# Patient Record
Sex: Female | Born: 1975 | Race: Black or African American | Hispanic: No | Smoking: Never smoker
Health system: Southern US, Community
[De-identification: ages and names within clinical notes are randomized; demographics above are authoritative.]

## PROBLEM LIST (undated history)

## (undated) DIAGNOSIS — J309 Allergic rhinitis, unspecified: Secondary | ICD-10-CM

## (undated) DIAGNOSIS — J42 Unspecified chronic bronchitis: Secondary | ICD-10-CM

## (undated) DIAGNOSIS — J189 Pneumonia, unspecified organism: Secondary | ICD-10-CM

## (undated) DIAGNOSIS — I509 Heart failure, unspecified: Secondary | ICD-10-CM

## (undated) HISTORY — DX: Pneumonia, unspecified organism: J18.9

## (undated) HISTORY — PX: OTHER SURGICAL HISTORY: SHX169

## (undated) HISTORY — DX: Allergic rhinitis, unspecified: J30.9

## (undated) HISTORY — DX: Unspecified chronic bronchitis: J42

---

## 1998-11-05 ENCOUNTER — Inpatient Hospital Stay (HOSPITAL_COMMUNITY): Admission: EM | Admit: 1998-11-05 | Discharge: 1998-12-02 | Payer: Self-pay | Admitting: Emergency Medicine

## 1998-11-05 ENCOUNTER — Encounter: Payer: Self-pay | Admitting: Emergency Medicine

## 1998-11-06 ENCOUNTER — Encounter: Payer: Self-pay | Admitting: Critical Care Medicine

## 1998-11-06 ENCOUNTER — Encounter: Payer: Self-pay | Admitting: Emergency Medicine

## 1998-11-07 ENCOUNTER — Encounter: Payer: Self-pay | Admitting: Critical Care Medicine

## 1998-11-08 ENCOUNTER — Encounter: Payer: Self-pay | Admitting: Critical Care Medicine

## 1998-11-09 ENCOUNTER — Encounter: Payer: Self-pay | Admitting: Critical Care Medicine

## 1998-11-10 ENCOUNTER — Encounter: Payer: Self-pay | Admitting: Critical Care Medicine

## 1998-11-14 ENCOUNTER — Encounter: Payer: Self-pay | Admitting: Pulmonary Disease

## 1998-11-17 ENCOUNTER — Encounter: Payer: Self-pay | Admitting: Pulmonary Disease

## 1998-11-22 ENCOUNTER — Encounter: Payer: Self-pay | Admitting: Pulmonary Disease

## 1998-11-23 ENCOUNTER — Encounter: Payer: Self-pay | Admitting: Pulmonary Disease

## 1998-11-24 ENCOUNTER — Encounter: Payer: Self-pay | Admitting: Pulmonary Disease

## 2001-11-16 ENCOUNTER — Emergency Department (HOSPITAL_COMMUNITY): Admission: EM | Admit: 2001-11-16 | Discharge: 2001-11-16 | Payer: Self-pay

## 2001-12-01 ENCOUNTER — Encounter: Payer: Self-pay | Admitting: Family Medicine

## 2001-12-01 ENCOUNTER — Ambulatory Visit (HOSPITAL_COMMUNITY): Admission: RE | Admit: 2001-12-01 | Discharge: 2001-12-01 | Payer: Self-pay | Admitting: Family Medicine

## 2002-11-30 ENCOUNTER — Encounter: Payer: Self-pay | Admitting: Emergency Medicine

## 2002-11-30 ENCOUNTER — Emergency Department (HOSPITAL_COMMUNITY): Admission: EM | Admit: 2002-11-30 | Discharge: 2002-11-30 | Payer: Self-pay | Admitting: Emergency Medicine

## 2004-01-04 ENCOUNTER — Emergency Department (HOSPITAL_COMMUNITY): Admission: EM | Admit: 2004-01-04 | Discharge: 2004-01-05 | Payer: Self-pay | Admitting: Emergency Medicine

## 2005-03-20 ENCOUNTER — Emergency Department (HOSPITAL_COMMUNITY): Admission: EM | Admit: 2005-03-20 | Discharge: 2005-03-20 | Payer: Self-pay | Admitting: Emergency Medicine

## 2005-09-13 ENCOUNTER — Ambulatory Visit: Payer: Self-pay | Admitting: Family Medicine

## 2005-09-17 ENCOUNTER — Ambulatory Visit: Payer: Self-pay | Admitting: *Deleted

## 2005-12-06 ENCOUNTER — Ambulatory Visit: Payer: Self-pay | Admitting: Family Medicine

## 2006-01-03 ENCOUNTER — Emergency Department (HOSPITAL_COMMUNITY): Admission: EM | Admit: 2006-01-03 | Discharge: 2006-01-03 | Payer: Self-pay | Admitting: Emergency Medicine

## 2006-09-10 ENCOUNTER — Emergency Department (HOSPITAL_COMMUNITY): Admission: EM | Admit: 2006-09-10 | Discharge: 2006-09-10 | Payer: Self-pay | Admitting: Emergency Medicine

## 2007-10-14 ENCOUNTER — Ambulatory Visit: Payer: Self-pay | Admitting: Family Medicine

## 2007-12-05 ENCOUNTER — Emergency Department (HOSPITAL_COMMUNITY): Admission: EM | Admit: 2007-12-05 | Discharge: 2007-12-05 | Payer: Self-pay | Admitting: Emergency Medicine

## 2008-10-01 ENCOUNTER — Emergency Department (HOSPITAL_COMMUNITY): Admission: EM | Admit: 2008-10-01 | Discharge: 2008-10-01 | Payer: Self-pay | Admitting: Emergency Medicine

## 2008-10-18 ENCOUNTER — Emergency Department (HOSPITAL_COMMUNITY): Admission: EM | Admit: 2008-10-18 | Discharge: 2008-10-19 | Payer: Self-pay | Admitting: Emergency Medicine

## 2008-11-02 ENCOUNTER — Emergency Department (HOSPITAL_COMMUNITY): Admission: EM | Admit: 2008-11-02 | Discharge: 2008-11-02 | Payer: Self-pay | Admitting: Family Medicine

## 2009-05-09 ENCOUNTER — Emergency Department (HOSPITAL_COMMUNITY): Admission: EM | Admit: 2009-05-09 | Discharge: 2009-05-09 | Payer: Self-pay | Admitting: Emergency Medicine

## 2009-06-24 ENCOUNTER — Emergency Department (HOSPITAL_COMMUNITY): Admission: EM | Admit: 2009-06-24 | Discharge: 2009-06-24 | Payer: Self-pay | Admitting: Emergency Medicine

## 2009-08-18 ENCOUNTER — Emergency Department (HOSPITAL_COMMUNITY): Admission: EM | Admit: 2009-08-18 | Discharge: 2009-08-18 | Payer: Self-pay | Admitting: Emergency Medicine

## 2009-11-12 ENCOUNTER — Emergency Department (HOSPITAL_COMMUNITY): Admission: EM | Admit: 2009-11-12 | Discharge: 2009-11-12 | Payer: Self-pay | Admitting: Emergency Medicine

## 2010-02-01 ENCOUNTER — Emergency Department (HOSPITAL_COMMUNITY): Admission: EM | Admit: 2010-02-01 | Discharge: 2010-02-02 | Payer: Self-pay | Admitting: Emergency Medicine

## 2011-01-17 LAB — URINALYSIS, ROUTINE W REFLEX MICROSCOPIC
Bilirubin Urine: NEGATIVE
Hgb urine dipstick: NEGATIVE
Ketones, ur: 15 mg/dL — AB
Protein, ur: NEGATIVE mg/dL
Urobilinogen, UA: 0.2 mg/dL (ref 0.0–1.0)

## 2011-01-17 LAB — DIFFERENTIAL
Basophils Absolute: 0.1 10*3/uL (ref 0.0–0.1)
Lymphocytes Relative: 34 % (ref 12–46)
Lymphs Abs: 2.8 10*3/uL (ref 0.7–4.0)
Monocytes Absolute: 0.5 10*3/uL (ref 0.1–1.0)
Monocytes Relative: 6 % (ref 3–12)
Neutro Abs: 4.8 10*3/uL (ref 1.7–7.7)
Neutrophils Relative %: 57 % (ref 43–77)

## 2011-01-17 LAB — BASIC METABOLIC PANEL
CO2: 24 mEq/L (ref 19–32)
Calcium: 9.2 mg/dL (ref 8.4–10.5)
Chloride: 107 mEq/L (ref 96–112)
GFR calc Af Amer: >60 mL/min (ref >60)
Glucose, Bld: 81 mg/dL (ref 70–99)
Potassium: 3.5 mEq/L (ref 3.5–5.1)

## 2011-01-17 LAB — CBC: Hemoglobin: 12.5 g/dL (ref 12.0–15.0)

## 2011-01-17 LAB — PREGNANCY, URINE: Preg Test, Ur: NEGATIVE

## 2011-01-17 LAB — ABO/RH: ABO/RH(D): A POS

## 2011-01-17 LAB — TYPE AND SCREEN: ABO/RH(D): A POS

## 2011-08-01 ENCOUNTER — Emergency Department (HOSPITAL_COMMUNITY)
Admission: EM | Admit: 2011-08-01 | Discharge: 2011-08-02 | Disposition: A | Discharge status: Home / Self Care | Payer: Self-pay | Attending: Emergency Medicine | Admitting: Emergency Medicine

## 2011-08-01 DIAGNOSIS — E669 Obesity, unspecified: Secondary | ICD-10-CM | POA: Insufficient documentation

## 2011-08-01 DIAGNOSIS — R112 Nausea with vomiting, unspecified: Secondary | ICD-10-CM | POA: Insufficient documentation

## 2011-08-01 DIAGNOSIS — H5710 Ocular pain, unspecified eye: Secondary | ICD-10-CM | POA: Insufficient documentation

## 2011-08-01 DIAGNOSIS — J45909 Unspecified asthma, uncomplicated: Secondary | ICD-10-CM | POA: Insufficient documentation

## 2011-08-01 DIAGNOSIS — R51 Headache: Secondary | ICD-10-CM | POA: Insufficient documentation

## 2011-08-03 LAB — CK TOTAL AND CKMB (NOT AT ARMC)
CK, MB: 1.4 ng/mL (ref 0.3–4.0)
Total CK: 161 U/L (ref 7–177)

## 2011-08-03 LAB — URINALYSIS, ROUTINE W REFLEX MICROSCOPIC
Glucose, UA: NEGATIVE mg/dL
Ketones, ur: >80 mg/dL — AB
Protein, ur: 100 mg/dL — AB
Specific Gravity, Urine: 1.034 — ABNORMAL HIGH (ref 1.005–1.030)
Urobilinogen, UA: 2 mg/dL — ABNORMAL HIGH (ref 0.0–1.0)

## 2011-08-03 LAB — POCT I-STAT, CHEM 8
Creatinine, Ser: 1 mg/dL (ref 0.4–1.2)
HCT: 37 % (ref 36.0–46.0)
Hemoglobin: 12.6 g/dL (ref 12.0–15.0)
Sodium: 135 mEq/L (ref 135–145)
TCO2: 16 mmol/L (ref 0–100)

## 2011-08-03 LAB — CBC
MCHC: 32.5 g/dL (ref 30.0–36.0)
Platelets: 307 10*3/uL (ref 150–400)
RBC: 4.72 MIL/uL (ref 3.87–5.11)

## 2011-08-03 LAB — DIFFERENTIAL
Basophils Absolute: 0.1 10*3/uL (ref 0.0–0.1)
Lymphocytes Relative: 10 % — ABNORMAL LOW (ref 12–46)
Neutro Abs: 16.7 10*3/uL — ABNORMAL HIGH (ref 1.7–7.7)

## 2011-08-03 LAB — HEPATIC FUNCTION PANEL
ALT: 11 U/L (ref 0–35)
AST: 17 U/L (ref 0–37)
Total Bilirubin: 1.1 mg/dL (ref 0.3–1.2)

## 2011-08-03 LAB — URINE MICROSCOPIC-ADD ON

## 2011-08-03 LAB — AMYLASE: Amylase: 125 U/L (ref 27–131)

## 2011-09-24 ENCOUNTER — Emergency Department (HOSPITAL_COMMUNITY)
Admission: EM | Admit: 2011-09-24 | Discharge: 2011-09-24 | Disposition: A | Discharge status: Home / Self Care | Payer: Self-pay | Attending: Emergency Medicine | Admitting: Emergency Medicine

## 2011-09-24 ENCOUNTER — Encounter: Payer: Self-pay | Admitting: Emergency Medicine

## 2011-09-24 DIAGNOSIS — K047 Periapical abscess without sinus: Secondary | ICD-10-CM | POA: Insufficient documentation

## 2011-09-24 DIAGNOSIS — R221 Localized swelling, mass and lump, neck: Secondary | ICD-10-CM | POA: Insufficient documentation

## 2011-09-24 DIAGNOSIS — K029 Dental caries, unspecified: Secondary | ICD-10-CM | POA: Insufficient documentation

## 2011-09-24 DIAGNOSIS — K0510 Chronic gingivitis, plaque induced: Secondary | ICD-10-CM | POA: Insufficient documentation

## 2011-09-24 DIAGNOSIS — J45909 Unspecified asthma, uncomplicated: Secondary | ICD-10-CM | POA: Insufficient documentation

## 2011-09-24 DIAGNOSIS — R220 Localized swelling, mass and lump, head: Secondary | ICD-10-CM | POA: Insufficient documentation

## 2011-09-24 MED ORDER — TRAMADOL HCL 50 MG PO TABS
50.0000 mg | ORAL_TABLET | Freq: Once | ORAL | Status: AC
  Administered 2011-09-24: 50 mg via ORAL
  Filled 2011-09-24: qty 1

## 2011-09-24 MED ORDER — TRAMADOL HCL 50 MG PO TABS: 50.0000 mg | ORAL_TABLET | Freq: Four times a day (QID) | ORAL | Status: AC | PRN

## 2011-09-24 MED ORDER — PENICILLIN V POTASSIUM 500 MG PO TABS: 500.0000 mg | ORAL_TABLET | Freq: Three times a day (TID) | ORAL | Status: AC

## 2011-09-24 MED ORDER — PENICILLIN V POTASSIUM 250 MG PO TABS
500.0000 mg | ORAL_TABLET | Freq: Once | ORAL | Status: AC
  Administered 2011-09-24: 500 mg via ORAL
  Filled 2011-09-24: qty 2

## 2011-09-24 NOTE — ED Notes (Signed)
C/o abscess to L side of face since Friday.

## 2011-09-24 NOTE — ED Provider Notes (Signed)
Medical screening examination/treatment/procedure(s) were performed by non-physician practitioner and as supervising physician I was immediately available for consultation/collaboration.    Nelia Shi, MD 09/24/11 1330

## 2011-09-24 NOTE — ED Provider Notes (Signed)
History     CSN: [id number] Arrival date & time: 09/24/2011 12:30 AM   First MD Initiated Contact with Patient 09/24/11 0458      Chief Complaint  Patient presents with  . Abscess    (Consider location/radiation/quality/duration/timing/severity/associated sxs/prior treatment) HPI Comments: This patient with extensive dental caries and gingivitis.  Now has a left lower gum swollen area.  Patient is fearful that this is cancer.  She states that last year.  She was to have some dental work, but she backed out due to fear, does not have a Radiation protection practitioner at this time.  She has not tried any over-the-counter medication for the pain does not apply ice to the side of her face .  Patient makes the area, more pain.Marland Kitchen this has been going on for 3 days  Patient is a 35 y.o. female presenting with abscess. The history is provided by the patient.  Abscess  The current episode started yesterday. The problem occurs continuously. The problem has been gradually worsening. The abscess is present on the face. The problem is mild. The abscess is characterized by swelling. The abscess first occurred at home. There were no sick contacts. She has received no recent medical care.    Past Medical History  Diagnosis Date  . Asthma     History reviewed. No pertinent past surgical history.  No family history on file.  History  Substance Use Topics  . Smoking status: Never Smoker   . Smokeless tobacco: Not on file  . Alcohol Use: No    OB History    Grav Para Term Preterm Abortions TAB SAB Ect Mult Living                  Review of Systems  Constitutional: Negative.   HENT: Positive for facial swelling. Negative for ear pain and neck pain.   Eyes: Negative.   Respiratory: Negative.   Cardiovascular: Negative.   Gastrointestinal: Negative.   Genitourinary: Negative.   Musculoskeletal: Negative.   Neurological: Negative for weakness and headaches.  Hematological: Negative for adenopathy.    Psychiatric/Behavioral: Negative.     Allergies  Aspirin  Home Medications   Current Outpatient Rx  Name Route Sig Dispense Refill  . ALBUTEROL SULFATE HFA 108 (90 BASE) MCG/ACT IN AERS Inhalation Inhale 2 puffs into the lungs every 4 (four) hours as needed. For asthma attacks.     . IBUPROFEN 200 MG PO TABS Oral Take 800 mg by mouth every 6 (six) hours as needed. For pain.     Marland Kitchen PENICILLIN V POTASSIUM 500 MG PO TABS Oral Take 1 tablet (500 mg total) by mouth 3 (three) times daily. 30 tablet 0  . TRAMADOL HCL 50 MG PO TABS Oral Take 1 tablet (50 mg total) by mouth every 6 (six) hours as needed for pain. Maximum dose= 8 tablets per day 15 tablet 0    BP 129/82  Pulse 103  Temp(Src) 98.1 F (36.7 C) (Oral)  Resp 18  SpO2 100%  LMP 08/25/2011  Physical Exam  Constitutional: She is oriented to person, place, and time. She appears well-developed and well-nourished.  HENT:  Head: Normocephalic.  Mouth/Throat: Dental abscesses and dental caries present. No uvula swelling.    Eyes: EOM are normal.  Neck: Normal range of motion.  Cardiovascular: Normal rate.   Pulmonary/Chest: Effort normal.  Musculoskeletal: Normal range of motion.  Neurological: She is oriented to person, place, and time.  Skin: Skin is warm.  Psychiatric: She has a  normal mood and affect.    ED Course  Procedures (including critical care time)  Labs Reviewed - No data to display No results found.   1. Dental abscess       MDM  This is most likely dental abscess to 2 extensive cavities no anterior cervical adenopathy        Arman Filter, NP 09/24/11 [id number]

## 2013-01-28 ENCOUNTER — Emergency Department (HOSPITAL_COMMUNITY)
Admission: EM | Admit: 2013-01-28 | Discharge: 2013-01-28 | Disposition: A | Discharge status: Home / Self Care | Payer: BC Managed Care – PPO | Source: Home / Self Care | Attending: Emergency Medicine | Admitting: Emergency Medicine

## 2013-01-28 ENCOUNTER — Encounter (HOSPITAL_COMMUNITY): Payer: Self-pay | Admitting: *Deleted

## 2013-01-28 ENCOUNTER — Emergency Department (INDEPENDENT_AMBULATORY_CARE_PROVIDER_SITE_OTHER): Payer: BC Managed Care – PPO

## 2013-01-28 DIAGNOSIS — M25569 Pain in unspecified knee: Secondary | ICD-10-CM

## 2013-01-28 DIAGNOSIS — M25561 Pain in right knee: Secondary | ICD-10-CM

## 2013-01-28 MED ORDER — METHYLPREDNISOLONE ACETATE 80 MG/ML IJ SUSP
INTRAMUSCULAR | Status: AC
  Filled 2013-01-28: qty 1

## 2013-01-28 MED ORDER — HYDROCODONE-ACETAMINOPHEN 5-325 MG PO TABS
ORAL_TABLET | ORAL | Status: AC
  Filled 2013-01-28: qty 1

## 2013-01-28 MED ORDER — PREDNISONE 20 MG PO TABS: 20.0000 mg | ORAL_TABLET | Freq: Two times a day (BID) | ORAL | Status: DC

## 2013-01-28 MED ORDER — KETOROLAC TROMETHAMINE 60 MG/2ML IM SOLN
60.0000 mg | Freq: Once | INTRAMUSCULAR | Status: AC
  Administered 2013-01-28: 60 mg via INTRAMUSCULAR

## 2013-01-28 MED ORDER — METHYLPREDNISOLONE ACETATE 80 MG/ML IJ SUSP
80.0000 mg | Freq: Once | INTRAMUSCULAR | Status: AC
  Administered 2013-01-28: 80 mg via INTRAMUSCULAR

## 2013-01-28 MED ORDER — HYDROCODONE-ACETAMINOPHEN 5-325 MG PO TABS: ORAL_TABLET | ORAL | Status: DC

## 2013-01-28 MED ORDER — KETOROLAC TROMETHAMINE 60 MG/2ML IM SOLN
INTRAMUSCULAR | Status: AC
  Filled 2013-01-28: qty 2

## 2013-01-28 MED ORDER — DICLOFENAC SODIUM 75 MG PO TBEC: 75.0000 mg | DELAYED_RELEASE_TABLET | Freq: Two times a day (BID) | ORAL | Status: DC

## 2013-01-28 MED ORDER — HYDROCODONE-ACETAMINOPHEN 5-325 MG PO TABS
1.0000 | ORAL_TABLET | Freq: Once | ORAL | Status: AC
  Administered 2013-01-28: 1 via ORAL

## 2013-01-28 NOTE — ED Provider Notes (Signed)
Chief Complaint:   Chief Complaint  Patient presents with  . Knee Pain    History of Present Illness:   Carolyn Oconnor is a 37 year old female who has a one-week history of gradually increasing right knee pain. The pain is rated 9/10 in intensity. It involves the entire knee with radiation up in the thigh. She has some swelling of the knee and numbness. It hurts to bend the knee, walk, or cooperative on steps. Nothing makes it better. There is no locking, popping, or giving way of the joint. She denies any injury. No prior history of arthritis or knee problems.  Review of Systems:  Other than noted above, the patient denies any of the following symptoms: Systemic:  No fevers, chills, sweats, or aches.  No fatigue or tiredness. Musculoskeletal:  No joint pain, arthritis, bursitis, swelling, back pain, or neck pain. Neurological:  No muscular weakness, paresthesias, headache, or trouble with speech or coordination.  No dizziness.  PMFSH:  Past medical history, family history, social history, meds, and allergies were reviewed.  She is allergic to aspirin which causes a flareup of her asthma, but is able to take other nonsteroidal anti-inflammatory agents. Her asthma is controlled with Ventolin.  Physical Exam:   Vital signs:  BP 91/56  Pulse 88  Temp(Src) 98 F (36.7 C) (Oral)  Resp 22  SpO2 100% Gen:  Alert and oriented times 3.  In no distress. Musculoskeletal: No swelling or effusion. The knee is extremely tender to palpation and she only moved up 2. Unable to do McMurray's sign, Lachman's sign, drawer sign, or varus or valgus stress.  Otherwise, all joints had a full a ROM with no swelling, bruising or deformity.  No edema, pulses full. Extremities were warm and pink.  Capillary refill was brisk.  Skin:  Clear, warm and dry.  No rash. Neuro:  Alert and oriented times 3.  Muscle strength was normal.  Sensation was intact to light touch.   Radiology:  Dg Knee Complete 4 Views  Right  01/28/2013  *RADIOLOGY REPORT*  Clinical Data: Pain, no known injury  RIGHT KNEE - COMPLETE 4+ VIEW  Comparison: None.  Findings: Four views of the right knee submitted.  No acute fracture or subluxation.  No radiopaque foreign body.  Small joint effusion.  IMPRESSION: No acute fracture or subluxation.  Small joint effusion.   Original Report Authenticated By: Natasha Mead, M.D.    I reviewed the images independently and personally and concur with the radiologist's findings.  Course in Urgent Care Center:    She was placed in a knee immobilizer and given crutches for ambulation. For pain she was given Toradol 60 mg IM, Depo-Medrol 80 mg IM, and Norco 5/325 one by mouth.  Assessment:  The encounter diagnosis was Knee pain, acute, right.  Her x-ray showed only a small effusion and no bony abnormalities. Suspect cartilage injury or damage, tendinitis, or tendon or ligament injury. She will need followup by orthopedist.  Plan:   1.  The following meds were prescribed:   Discharge Medication List as of 01/28/2013  2:22 PM    START taking these medications   Details  diclofenac (VOLTAREN) 75 MG EC tablet Take 1 tablet (75 mg total) by mouth 2 (two) times daily., Starting 01/28/2013, Until Discontinued, Normal    HYDROcodone-acetaminophen (NORCO/VICODIN) 5-325 MG per tablet 1 to 2 tabs every 4 to 6 hours as needed for pain., Print    predniSONE (DELTASONE) 20 MG tablet Take 1 tablet (20  mg total) by mouth 2 (two) times daily., Starting 01/28/2013, Until Discontinued, Normal       2.  The patient was instructed in symptomatic care, including rest and activity, elevation, application of ice and compression.  Appropriate handouts were given. 3.  The patient was told to return if becoming worse in any way, if no better in 3 or 4 days, and given some red flag symptoms such as fever, worsening pain, or neurological symptoms that would indicate earlier return.   4.  The patient was told to follow up Dr.  Patsy Lager as soon as possible.    Reuben Likes, MD 01/28/13 2026

## 2013-01-28 NOTE — ED Notes (Signed)
Pt  Reports  Pain  r  Knee  For  About  1  Week  She  denys  Any  Injury  She  States  The  Knee  Seems  Slightly swollen  She  Reports  Pain on  Ambulation

## 2014-01-22 ENCOUNTER — Emergency Department (INDEPENDENT_AMBULATORY_CARE_PROVIDER_SITE_OTHER)
Admission: EM | Admit: 2014-01-22 | Discharge: 2014-01-22 | Disposition: A | Discharge status: Home / Self Care | Payer: BC Managed Care – PPO | Source: Home / Self Care | Attending: Family Medicine | Admitting: Family Medicine

## 2014-01-22 ENCOUNTER — Encounter (HOSPITAL_COMMUNITY): Payer: Self-pay | Admitting: Emergency Medicine

## 2014-01-22 ENCOUNTER — Emergency Department (INDEPENDENT_AMBULATORY_CARE_PROVIDER_SITE_OTHER): Payer: BC Managed Care – PPO

## 2014-01-22 DIAGNOSIS — R05 Cough: Secondary | ICD-10-CM

## 2014-01-22 DIAGNOSIS — M928 Other specified juvenile osteochondrosis: Secondary | ICD-10-CM

## 2014-01-22 DIAGNOSIS — A088 Other specified intestinal infections: Secondary | ICD-10-CM

## 2014-01-22 DIAGNOSIS — R059 Cough, unspecified: Secondary | ICD-10-CM

## 2014-01-22 DIAGNOSIS — A084 Viral intestinal infection, unspecified: Secondary | ICD-10-CM

## 2014-01-22 DIAGNOSIS — J45991 Cough variant asthma: Secondary | ICD-10-CM

## 2014-01-22 DIAGNOSIS — M9260 Juvenile osteochondrosis of tarsus, unspecified ankle: Secondary | ICD-10-CM

## 2014-01-22 DIAGNOSIS — R053 Chronic cough: Secondary | ICD-10-CM

## 2014-01-22 MED ORDER — ONDANSETRON HCL 8 MG PO TABS: 8.0000 mg | ORAL_TABLET | Freq: Three times a day (TID) | ORAL | Status: DC | PRN

## 2014-01-22 MED ORDER — ONDANSETRON 4 MG PO TBDP
ORAL_TABLET | ORAL | Status: AC
  Filled 2014-01-22: qty 2

## 2014-01-22 MED ORDER — ALBUTEROL SULFATE HFA 108 (90 BASE) MCG/ACT IN AERS: 2.0000 | INHALATION_SPRAY | RESPIRATORY_TRACT | Status: DC | PRN

## 2014-01-22 MED ORDER — IPRATROPIUM-ALBUTEROL 0.5-2.5 (3) MG/3ML IN SOLN
RESPIRATORY_TRACT | Status: AC
  Filled 2014-01-22: qty 3

## 2014-01-22 MED ORDER — PREDNISONE 50 MG PO TABS: 50.0000 mg | ORAL_TABLET | Freq: Every day | ORAL | Status: DC

## 2014-01-22 MED ORDER — IPRATROPIUM-ALBUTEROL 0.5-2.5 (3) MG/3ML IN SOLN
3.0000 mL | Freq: Once | RESPIRATORY_TRACT | Status: AC
  Administered 2014-01-22: 3 mL via RESPIRATORY_TRACT

## 2014-01-22 MED ORDER — ONDANSETRON 4 MG PO TBDP
8.0000 mg | ORAL_TABLET | Freq: Once | ORAL | Status: AC
  Administered 2014-01-22: 8 mg via ORAL

## 2014-01-22 MED ORDER — FLUTICASONE PROPIONATE 50 MCG/ACT NA SUSP: 2.0000 | Freq: Every day | NASAL | Status: DC

## 2014-01-22 NOTE — ED Notes (Signed)
Went to get patient for CXR, still receiving breathing treatment

## 2014-01-22 NOTE — Discharge Instructions (Signed)
Thank you for coming in today.  1) cough: Likely due to asthma.  Take prednisone daily as directed for 5 days.  Additionally use albuterol as needed.  Also recommend using Flonase nasal spray.  Please followup with her primary care Dr. for this issue. Additionally you may take over-the-counter omeprazole (Prilosec) to help combat some of the acid reflux symptoms that may be causing cough.  2) vomiting: Viral stomach bug. Use Zofran as needed. Return to work when no longer having vomiting or diarrhea.   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 It is irritation of the Achilles tendon as it inserts onto the bone.  You can use over-the-counter Aspercreme.  Also the heel exercises we talked about are very important to get this better  If not getting better please follow up with sports medicine  Call or go to the emergency room if you get worse, have trouble breathing, have chest pains, or palpitations.  If your belly pain worsens, or you have high fever, bad vomiting, blood in your stool or black tarry stool go to the Emergency Room.    Asthma, Adult Asthma is a recurring condition in which the airways tighten and narrow. Asthma can make it difficult to breathe. It can cause coughing, wheezing, and shortness of breath. Asthma episodes (also called asthma attacks) range from minor to life-threatening. Asthma cannot be cured, but medicines and lifestyle changes can help control it. CAUSES Asthma is believed to be caused by inherited (genetic) and environmental factors, but its exact cause is unknown. Asthma may be triggered by allergens, lung infections, or irritants in the air. Asthma triggers are different for each person. Common triggers include:   Animal dander.  Dust mites.  Cockroaches.  Pollen from trees or grass.  Mold.  Smoke.  Air pollutants such as dust, household cleaners, hair sprays, aerosol sprays, paint fumes, strong chemicals, or strong  odors.  Cold air, weather changes, and winds (which increase molds and pollens in the air).  Strong emotional expressions such as crying or laughing hard.  Stress.  Certain medicines (such as aspirin) or types of drugs (such as beta-blockers).  Sulfites in foods and drinks. Foods and drinks that may contain sulfites include dried fruit, potato chips, and sparkling grape juice.  Infections or inflammatory conditions such as the flu, a cold, or an inflammation of the nasal membranes (rhinitis).  Gastroesophageal reflux disease (GERD).  Exercise or strenuous activity. SYMPTOMS Symptoms may occur immediately after asthma is triggered or many hours later. Symptoms include:  Wheezing.  Excessive nighttime or early morning coughing.  Frequent or severe coughing with a common cold.  Chest tightness.  Shortness of breath. DIAGNOSIS  The diagnosis of asthma is made by a review of your medical history and a physical exam. Tests may also be performed. These may include:  Lung function studies. These tests show how much air you breath in and out.  Allergy tests.  Imaging tests such as X-rays. TREATMENT  Asthma cannot be cured, but it can usually be controlled. Treatment involves identifying and avoiding your asthma triggers. It also involves medicines. There are 2 classes of medicine used for asthma treatment:   Controller medicines. These prevent asthma symptoms from occurring. They are usually taken every day.  Reliever or rescue medicines. These quickly relieve asthma symptoms. They are used as needed and provide short-term relief. Your health care provider will help you create an asthma action plan. An asthma action plan  is a written plan for managing and treating your asthma attacks. It includes a list of your asthma triggers and how they may be avoided. It also includes information on when medicines should be taken and when their dosage should be changed. An action plan may also  involve the use of a device called a peak flow meter. A peak flow meter measures how well the lungs are working. It helps you monitor your condition. HOME CARE INSTRUCTIONS   Take medicine as directed by your health care provider. Speak with your health care provider if you have questions about how or when to take the medicines.  Use a peak flow meter as directed by your health care provider. Record and keep track of readings.  Understand and use the action plan to help minimize or stop an asthma attack without needing to seek medical care.  Control your home environment in the following ways to help prevent asthma attacks:  Do not smoke. Avoid being exposed to secondhand smoke.  Change your heating and air conditioning filter regularly.  Limit your use of fireplaces and wood stoves.  Get rid of pests (such as roaches and mice) and their droppings.  Throw away plants if you see mold on them.  Clean your floors and dust regularly. Use unscented cleaning products.  Try to have someone else vacuum for you regularly. Stay out of rooms while they are being vacuumed and for a short while afterward. If you vacuum, use a dust mask from a hardware store, a double-layered or microfilter vacuum cleaner bag, or a vacuum cleaner with a HEPA filter.  Replace carpet with wood, tile, or vinyl flooring. Carpet can trap dander and dust.  Use allergy-proof pillows, mattress covers, and box spring covers.  Wash bed sheets and blankets every week in hot water and dry them in a dryer.  Use blankets that are made of polyester or cotton.  Clean bathrooms and kitchens with bleach. If possible, have someone repaint the walls in these rooms with mold-resistant paint. Keep out of the rooms that are being cleaned and painted.  Wash hands frequently. SEEK MEDICAL CARE IF:   You have wheezing, shortness of breath, or a cough even if taking medicine to prevent attacks.  The colored mucus you cough up  (sputum) is thicker than usual.  Your sputum changes from clear or white to yellow, green, gray, or bloody.  You have any problems that may be related to the medicines you are taking (such as a rash, itching, swelling, or trouble breathing).  You are using a reliever medicine more than 2 3 times per week.  Your peak flow is still at 50 79% of you personal best after following your action plan for 1 hour. SEEK IMMEDIATE MEDICAL CARE IF:   You seem to be getting worse and are unresponsive to treatment during an asthma attack.  You are short of breath even at rest.  You get short of breath when doing very little physical activity.  You have difficulty eating, drinking, or talking due to asthma symptoms.  You develop chest pain.  You develop a fast heartbeat.  You have a bluish color to your lips or fingernails.  You are lightheaded, dizzy, or faint.  Your peak flow is less than 50% of your personal best.  You have a fever or persistent symptoms for more than 2 3 days.  You have a fever and symptoms suddenly get worse. MAKE SURE YOU:   Understand these instructions.  Will  watch your condition.  Will get help right away if you are not doing well or get worse. Document Released: 10/15/2005 Document Revised: 06/17/2013 Document Reviewed: 05/14/2013 [facility] Patient Information 2014 [place], [place].  Achilles Tendinitis  with Rehab Achilles tendinitis is a disorder of the Achilles tendon. The Achilles tendon connects the large calf muscles (Gastrocnemius and Soleus) to the heel bone (calcaneus). This tendon is sometimes called the heel cord. It is important for pushing-off and standing on your toes and is important for walking, running, or jumping. Tendinitis is often caused by overuse and repetitive microtrauma. SYMPTOMS  Pain, tenderness, swelling, warmth, and redness may occur over the Achilles tendon even at rest.  Pain with pushing off, or flexing or extending the  ankle.  Pain that is worsened after or during activity. CAUSES   Overuse sometimes seen with rapid increase in exercise programs or in sports requiring running and jumping.  Poor physical conditioning (strength and flexibility or endurance).  Running sports, especially training running down hills.  Inadequate warm-up before practice or play or failure to stretch before participation.  Injury to the tendon. PREVENTION   Warm up and stretch before practice or competition.  Allow time for adequate rest and recovery between practices and competition.  Keep up conditioning.  Keep up ankle and leg flexibility.  Improve or keep muscle strength and endurance.  Improve cardiovascular fitness.  Use proper technique.  Use proper equipment (shoes, skates).  To help prevent recurrence, taping, protective strapping, or an adhesive bandage may be recommended for several weeks after healing is complete. PROGNOSIS   Recovery may take weeks to several months to heal.  Longer recovery is expected if symptoms have been prolonged.  Recovery is usually quicker if the inflammation is due to a direct blow as compared with overuse or sudden strain. RELATED COMPLICATIONS   Healing time will be prolonged if the condition is not correctly treated. The injury must be given plenty of time to heal.  Symptoms can reoccur if activity is resumed too soon.  Untreated, tendinitis may increase the risk of tendon rupture requiring additional time for recovery and possibly surgery. TREATMENT   The first treatment consists of rest anti-inflammatory medication, and ice to relieve the pain.  Stretching and strengthening exercises after resolution of pain will likely help reduce the risk of recurrence. Referral to a physical therapist or athletic trainer for further evaluation and treatment may be helpful.  A walking boot or cast may be recommended to rest the Achilles tendon. This can help break the cycle  of inflammation and microtrauma.  Arch supports (orthotics) may be prescribed or recommended by your caregiver as an adjunct to therapy and rest.  Surgery to remove the inflamed tendon lining or degenerated tendon tissue is rarely necessary and has shown less than predictable results. MEDICATION   Nonsteroidal anti-inflammatory medications, such as aspirin and ibuprofen, may be used for pain and inflammation relief. Do not take within 7 days before surgery. Take these as directed by your caregiver. Contact your caregiver immediately if any bleeding, stomach upset, or signs of allergic reaction occur. Other minor pain relievers, such as acetaminophen, may also be used.  Pain relievers may be prescribed as necessary by your caregiver. Do not take prescription pain medication for longer than 4 to 7 days. Use only as directed and only as much as you need.  Cortisone injections are rarely indicated. Cortisone injections may weaken tendons and predispose to rupture. It is better to give the condition more  time to heal than to use them. HEAT AND COLD  Cold is used to relieve pain and reduce inflammation for acute and chronic Achilles tendinitis. Cold should be applied for 10 to 15 minutes every 2 to 3 hours for inflammation and pain and immediately after any activity that aggravates your symptoms. Use ice packs or an ice massage.  Heat may be used before performing stretching and strengthening activities prescribed by your caregiver. Use a heat pack or a warm soak. SEEK MEDICAL CARE IF:  Symptoms get worse or do not improve in 2 weeks despite treatment.  New, unexplained symptoms develop. Drugs used in treatment may produce side effects. EXERCISES RANGE OF MOTION (ROM) AND STRETCHING EXERCISES - Achilles Tendinitis  These exercises may help you when beginning to rehabilitate your injury. Your symptoms may resolve with or without further involvement from your physician, physical therapist or athletic  trainer. While completing these exercises, remember:   Restoring tissue flexibility helps normal motion to return to the joints. This allows healthier, less painful movement and activity.  An effective stretch should be held for at least 30 seconds.  A stretch should never be painful. You should only feel a gentle lengthening or release in the stretched tissue. STRETCH  Gastroc, Standing   Place hands on wall.  Extend right / left leg, keeping the front knee somewhat bent.  Slightly point your toes inward on your back foot.  Keeping your right / left heel on the floor and your knee straight, shift your weight toward the wall, not allowing your back to arch.  You should feel a gentle stretch in the right / left calf. Hold this position for __________ seconds. Repeat __________ times. Complete this stretch __________ times per day. STRETCH  Soleus, Standing   Place hands on wall.  Extend right / left leg, keeping the other knee somewhat bent.  Slightly point your toes inward on your back foot.  Keep your right / left heel on the floor, bend your back knee, and slightly shift your weight over the back leg so that you feel a gentle stretch deep in your back calf.  Hold this position for __________ seconds. Repeat __________ times. Complete this stretch __________ times per day. STRETCH  Gastrocsoleus, Standing  Note: This exercise can place a lot of stress on your foot and ankle. Please complete this exercise only if specifically instructed by your caregiver.   Place the ball of your right / left foot on a step, keeping your other foot firmly on the same step.  Hold on to the wall or a rail for balance.  Slowly lift your other foot, allowing your body weight to press your heel down over the edge of the step.  You should feel a stretch in your right / left calf.  Hold this position for __________ seconds.  Repeat this exercise with a slight bend in your knee. Repeat __________  times. Complete this stretch __________ times per day.  STRENGTHENING EXERCISES - Achilles Tendinitis These exercises may help you when beginning to rehabilitate your injury. They may resolve your symptoms with or without further involvement from your physician, physical therapist or athletic trainer. While completing these exercises, remember:   Muscles can gain both the endurance and the strength needed for everyday activities through controlled exercises.  Complete these exercises as instructed by your physician, physical therapist or athletic trainer. Progress the resistance and repetitions only as guided.  You may experience muscle soreness or fatigue, but the pain  or discomfort you are trying to eliminate should never worsen during these exercises. If this pain does worsen, stop and make certain you are following the directions exactly. If the pain is still present after adjustments, discontinue the exercise until you can discuss the trouble with your clinician. STRENGTH - Plantar-flexors   Sit with your right / left leg extended. Holding onto both ends of a rubber exercise band/tubing, loop it around the ball of your foot. Keep a slight tension in the band.  Slowly push your toes away from you, pointing them downward.  Hold this position for __________ seconds. Return slowly, controlling the tension in the band/tubing. Repeat __________ times. Complete this exercise __________ times per day.  STRENGTH - Plantar-flexors   Stand with your feet shoulder width apart. Steady yourself with a wall or table using as little support as needed.  Keeping your weight evenly spread over the width of your feet, rise up on your toes.*  Hold this position for __________ seconds. Repeat __________ times. Complete this exercise __________ times per day.  *If this is too easy, shift your weight toward your right / left leg until you feel challenged. Ultimately, you may be asked to do this exercise with  your right / left foot only. STRENGTH  Plantar-flexors, Eccentric  Note: This exercise can place a lot of stress on your foot and ankle. Please complete this exercise only if specifically instructed by your caregiver.   Place the balls of your feet on a step. With your hands, use only enough support from a wall or rail to keep your balance.  Keep your knees straight and rise up on your toes.  Slowly shift your weight entirely to your right / left toes and pick up your opposite foot. Gently and with controlled movement, lower your weight through your right / left foot so that your heel drops below the level of the step. You will feel a slight stretch in the back of your calf at the end position.  Use the healthy leg to help rise up onto the balls of both feet, then lower weight only on the right / left leg again. Build up to 15 repetitions. Then progress to 3 consecutive sets of 15 repetitions.*  After completing the above exercise, complete the same exercise with a slight knee bend (about 30 degrees). Again, build up to 15 repetitions. Then progress to 3 consecutive sets of 15 repetitions.* Perform this exercise __________ times per day.  *When you easily complete 3 sets of 15, your physician, physical therapist or athletic trainer may advise you to add resistance by wearing a backpack filled with additional weight. STRENGTH - Plantar Flexors, Seated   Sit on a chair that allows your feet to rest flat on the ground. If necessary, sit at the edge of the chair.  Keeping your toes firmly on the ground, lift your right / left heel as far as you can without increasing any discomfort in your ankle. Repeat __________ times. Complete this exercise __________ times a day. *If instructed by your physician, physical therapist or athletic trainer, you may add ____________________ of resistance by placing a weighted object on your right / left knee. Document Released: 05/16/2005 Document Revised: 01/07/2012  Document Reviewed: 01/27/2009 [facility] Patient Information 2014 [place], [place].

## 2014-01-22 NOTE — ED Notes (Signed)
Pt c/o cold sxs onset 1 week Sxs include: v/d, productive cough Denies fevers... Taking OTC cold meds w/no relief  Also c/o left foot pain onset 3 months Denies inj/trauma Alert w/no signs of acute distress.

## 2014-01-22 NOTE — ED Provider Notes (Signed)
Wynn BankerStacey L Liguori is a 38 y.o. female who presents to Urgent Care today for multiple issues.  1) chronic cough. Patient has a chronic cough for the last 15 years. This is associated with occasional wheezing. She has never had this evaluated to her knowledge. She is not taking any medications for these symptoms. She sometimes has wheezing.  2) nausea vomiting and diarrhea. Present for 2 days. No abdominal pain. No fevers or chills. No blood in the vomit or stool.  3) left foot pain. Patient is a painful bump on her left Achilles tendon insertion. The pain is worse with activity and prolonged standing. No medications or treatments tried. No fevers chills nausea vomiting or diarrhea.   Past Medical History  Diagnosis Date  . Asthma    History  Substance Use Topics  . Smoking status: Never Smoker   . Smokeless tobacco: Not on file  . Alcohol Use: No   ROS as above Medications: No current facility-administered medications for this encounter.   Current Outpatient Prescriptions  Medication Sig Dispense Refill  . albuterol (PROVENTIL HFA;VENTOLIN HFA) 108 (90 BASE) MCG/ACT inhaler Inhale 2 puffs into the lungs every 4 (four) hours as needed. For asthma attacks.  1 Inhaler  1  . fluticasone (FLONASE) 50 MCG/ACT nasal spray Place 2 sprays into both nostrils daily.  16 g  2  . ibuprofen (ADVIL,MOTRIN) 200 MG tablet Take 800 mg by mouth every 6 (six) hours as needed. For pain.       Marland Kitchen. ondansetron (ZOFRAN) 8 MG tablet Take 1 tablet (8 mg total) by mouth every 8 (eight) hours as needed for nausea or vomiting.  20 tablet  0  . predniSONE (DELTASONE) 50 MG tablet Take 1 tablet (50 mg total) by mouth daily.  5 tablet  0    Exam:  BP 139/78  Pulse 70  Temp(Src) 98.7 F (37.1 C) (Oral)  Resp 16  SpO2 99% Gen: Well NAD obese HEENT: EOMI,  MMM Lungs: Normal work of breathing. CTABL Heart: RRR no MRG Abd: NABS, Soft. NT, ND Exts: Brisk capillary refill, warm and well perfused.  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 Normal otherwise  Patient was given a DuoNeb nebulizer treatment, and felt much better  No results found for this or any previous visit (from the past 24 hour(s)). Dg Chest 2 View  01/22/2014   CLINICAL DATA:  Cough  EXAM: CHEST  2 VIEW  COMPARISON:  11/12/2009  FINDINGS: The heart size and mediastinal contours are within normal limits. Both lungs are clear. The visualized skeletal structures are unremarkable.  IMPRESSION: No active cardiopulmonary disease.   Electronically Signed   By: Marlan Palauharles  Clark M.D.   On: 01/22/2014 10:40    Assessment and Plan: 38 y.o. female with  1) cough: Chronic cough is likely cough variant asthma. Patient may also have a component of reflux. Will treat with prednisone and albuterol. Additionally his Flonase nasal spray and over-the-counter omeprazole. Followup with primary care provider.  2) vomiting: Likely viral gastroenteritis. Zofran the clinic provided. Prescription for Zofran. 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  3) foot pain: Haglund deformity. Discussed calf eccentric exercises  Discussed warning signs or symptoms. Please see discharge instructions. Patient expresses understanding.    Rodolph BongEvan S Anye Brose, MD 01/22/14 1120

## 2014-11-16 ENCOUNTER — Emergency Department (HOSPITAL_COMMUNITY)
Admission: EM | Admit: 2014-11-16 | Discharge: 2014-11-16 | Disposition: A | Discharge status: Home / Self Care | Payer: BLUE CROSS/BLUE SHIELD | Attending: Emergency Medicine | Admitting: Emergency Medicine

## 2014-11-16 ENCOUNTER — Encounter (HOSPITAL_COMMUNITY): Payer: Self-pay | Admitting: Physical Medicine and Rehabilitation

## 2014-11-16 ENCOUNTER — Emergency Department (HOSPITAL_COMMUNITY): Payer: BLUE CROSS/BLUE SHIELD

## 2014-11-16 DIAGNOSIS — J45909 Unspecified asthma, uncomplicated: Secondary | ICD-10-CM | POA: Diagnosis not present

## 2014-11-16 DIAGNOSIS — J020 Streptococcal pharyngitis: Secondary | ICD-10-CM | POA: Diagnosis not present

## 2014-11-16 DIAGNOSIS — Z7951 Long term (current) use of inhaled steroids: Secondary | ICD-10-CM | POA: Insufficient documentation

## 2014-11-16 DIAGNOSIS — Z79899 Other long term (current) drug therapy: Secondary | ICD-10-CM | POA: Insufficient documentation

## 2014-11-16 DIAGNOSIS — R112 Nausea with vomiting, unspecified: Secondary | ICD-10-CM | POA: Diagnosis present

## 2014-11-16 DIAGNOSIS — R51 Headache: Secondary | ICD-10-CM | POA: Insufficient documentation

## 2014-11-16 DIAGNOSIS — Z7952 Long term (current) use of systemic steroids: Secondary | ICD-10-CM | POA: Diagnosis not present

## 2014-11-16 LAB — RAPID STREP SCREEN (MED CTR MEBANE ONLY): STREPTOCOCCUS, GROUP A SCREEN (DIRECT): POSITIVE — AB

## 2014-11-16 MED ORDER — CEPHALEXIN 500 MG PO CAPS: 500.0000 mg | ORAL_CAPSULE | Freq: Four times a day (QID) | ORAL | Status: DC

## 2014-11-16 NOTE — ED Notes (Signed)
EDP at bedside  

## 2014-11-16 NOTE — ED Notes (Signed)
Pt presents to department for evaluation of nausea/vomiting, sore throat, headache and body aches. Ongoing for several days. Respirations unlabored. Pt is alert and oriented x4.

## 2014-11-16 NOTE — Discharge Instructions (Signed)
Keflex as prescribed.  Ibuprofen 600 mg every 6 hours as needed for pain.  Return to the emergency department for difficulty breathing or an inability to swallow, or if you develop any other new and concerning symptoms.   Strep Throat Strep throat is an infection of the throat caused by a bacteria named Streptococcus pyogenes. Your health care provider may call the infection streptococcal "tonsillitis" or "pharyngitis" depending on whether there are signs of inflammation in the tonsils or back of the throat. Strep throat is most common in children aged 5-15 years during the cold months of the year, but it can occur in people of any age during any season. This infection is spread from person to person (contagious) through coughing, sneezing, or other close contact. SIGNS AND SYMPTOMS   Fever or chills.  Painful, swollen, red tonsils or throat.  Pain or difficulty when swallowing.  White or yellow spots on the tonsils or throat.  Swollen, tender lymph nodes or "glands" of the neck or under the jaw.  Red rash all over the body (rare). DIAGNOSIS  Many different infections can cause the same symptoms. A test must be done to confirm the diagnosis so the right treatment can be given. A "rapid strep test" can help your health care provider make the diagnosis in a few minutes. If this test is not available, a light swab of the infected area can be used for a throat culture test. If a throat culture test is done, results are usually available in a day or two. TREATMENT  Strep throat is treated with antibiotic medicine. HOME CARE INSTRUCTIONS   Gargle with 1 tsp of salt in 1 cup of warm water, 3-4 times per day or as needed for comfort.  Family members who also have a sore throat or fever should be tested for strep throat and treated with antibiotics if they have the strep infection.  Make sure everyone in your household washes their hands well.  Do not share food, drinking cups, or personal  items that could cause the infection to spread to others.  You may need to eat a soft food diet until your sore throat gets better.  Drink enough water and fluids to keep your urine clear or pale yellow. This will help prevent dehydration.  Get plenty of rest.  Stay home from school, day care, or work until you have been on antibiotics for 24 hours.  Take medicines only as directed by your health care provider.  Take your antibiotic medicine as directed by your health care provider. Finish it even if you start to feel better. SEEK MEDICAL CARE IF:   The glands in your neck continue to enlarge.  You develop a rash, cough, or earache.  You cough up green, yellow-brown, or bloody sputum.  You have pain or discomfort not controlled by medicines.  Your problems seem to be getting worse rather than better.  You have a fever. SEEK IMMEDIATE MEDICAL CARE IF:   You develop any new symptoms such as vomiting, severe headache, stiff or painful neck, chest pain, shortness of breath, or trouble swallowing.  You develop severe throat pain, drooling, or changes in your voice.  You develop swelling of the neck, or the skin on the neck becomes red and tender.  You develop signs of dehydration, such as fatigue, dry mouth, and decreased urination.  You become increasingly sleepy, or you cannot wake up completely. MAKE SURE YOU:  Understand these instructions.  Will watch your condition.  Will  get help right away if you are not doing well or get worse. Document Released: 10/12/2000 Document Revised: 03/01/2014 Document Reviewed: 12/14/2010 [facility] Patient Information 2015 [employer] [place], [place]. This information is not intended to replace advice given to you by your health care provider. Make sure you discuss any questions you have with your health care provider.

## 2014-11-16 NOTE — ED Provider Notes (Signed)
CSN: [id number]     Arrival date & time 11/16/14  1152 History   First MD Initiated Contact with Patient 11/16/14 1157     Chief Complaint  Patient presents with  . Nausea  . Emesis  . Headache  . Sore Throat     (Consider location/radiation/quality/duration/timing/severity/associated sxs/prior Treatment) HPI Comments: Patient is a 39 year old female otherwise healthy who presents with complaints of chest congestion, sore throat, cough, nausea and vomiting that has been going on for the past 2 days. She states she had a fever at home, however is afebrile here. She denies any ill contacts.  Patient is a 39 y.o. female presenting with pharyngitis. The history is provided by the patient.  Sore Throat This is a new problem. The current episode started 2 days ago. The problem occurs constantly. The problem has been gradually worsening. Associated symptoms include headaches. Pertinent negatives include no chest pain, no abdominal pain and no shortness of breath. Nothing aggravates the symptoms. Nothing relieves the symptoms. Treatments tried: Goody powder. The treatment provided no relief.    Past Medical History  Diagnosis Date  . Asthma    History reviewed. No pertinent past surgical history. No family history on file. History  Substance Use Topics  . Smoking status: Never Smoker   . Smokeless tobacco: Not on file  . Alcohol Use: No   OB History    No data available     Review of Systems  Respiratory: Negative for shortness of breath.   Cardiovascular: Negative for chest pain.  Gastrointestinal: Negative for abdominal pain.  Neurological: Positive for headaches.  All other systems reviewed and are negative.     Allergies  Aspirin  Home Medications   Prior to Admission medications   Medication Sig Start Date End Date Taking? Authorizing Provider  albuterol (PROVENTIL HFA;VENTOLIN HFA) 108 (90 BASE) MCG/ACT inhaler Inhale 2 puffs into the lungs every 4 (four) hours as  needed. For asthma attacks. 01/22/14   Rodolph Bong, MD  fluticasone (FLONASE) 50 MCG/ACT nasal spray Place 2 sprays into both nostrils daily. 01/22/14   Rodolph Bong, MD  ibuprofen (ADVIL,MOTRIN) 200 MG tablet Take 800 mg by mouth every 6 (six) hours as needed. For pain.     Historical Provider, MD  ondansetron (ZOFRAN) 8 MG tablet Take 1 tablet (8 mg total) by mouth every 8 (eight) hours as needed for nausea or vomiting. 01/22/14   Rodolph Bong, MD  predniSONE (DELTASONE) 50 MG tablet Take 1 tablet (50 mg total) by mouth daily. 01/22/14   Rodolph Bong, MD   BP 123/71 mmHg  Pulse 104  Temp(Src) 98.4 F (36.9 C) (Oral)  Resp 18  Ht  (1.575 m)  Wt 224 lb (101.606 kg)  BMI 40.96 kg/m2  SpO2 100% Physical Exam  Constitutional: She is oriented to person, place, and time. She appears well-developed and well-nourished. No distress.  HENT:  Head: Normocephalic and atraumatic.  There is mild erythema of the posterior oropharynx, however no exudates.  Neck: Normal range of motion. Neck supple.  Cardiovascular: Normal rate and regular rhythm.  Exam reveals no gallop and no friction rub.   No murmur heard. Pulmonary/Chest: Effort normal and breath sounds normal. No respiratory distress. She has no wheezes. She has no rales.  Abdominal: Soft. Bowel sounds are normal. She exhibits no distension. There is no tenderness.  Musculoskeletal: Normal range of motion. She exhibits no edema.  Lymphadenopathy:    She has no cervical adenopathy.  Neurological: She is alert and oriented to person, place, and time.  Skin: Skin is warm and dry. She is not diaphoretic.  Nursing note and vitals reviewed.   ED Course  Procedures (including critical care time) Labs Review Labs Reviewed  RAPID STREP SCREEN    Imaging Review No results found.   EKG Interpretation None      MDM   Final diagnoses:  None    Chest x-ray is clear, however strep test is positive. This will be treated with Keflex and  when necessary return.    Geoffery Lyonsouglas Walaa Carel, MD 11/16/14 1355

## 2014-11-16 NOTE — ED Notes (Signed)
Patient transported to X-ray 

## 2015-03-15 ENCOUNTER — Institutional Professional Consult (permissible substitution): Payer: BLUE CROSS/BLUE SHIELD | Admitting: Internal Medicine

## 2016-03-01 ENCOUNTER — Telehealth: Payer: Self-pay | Admitting: Pulmonary Disease

## 2016-03-01 NOTE — Telephone Encounter (Signed)
IMAGING CXR PA/LAT 11/16/14 (personally reviewed by me): No focal opacity or effusion. No nodule appreciated. Heart normal in size. Mediastinum normal in contour.

## 2016-03-09 ENCOUNTER — Encounter: Payer: Self-pay | Admitting: Pulmonary Disease

## 2016-03-09 ENCOUNTER — Other Ambulatory Visit (INDEPENDENT_AMBULATORY_CARE_PROVIDER_SITE_OTHER): Payer: BLUE CROSS/BLUE SHIELD

## 2016-03-09 ENCOUNTER — Ambulatory Visit (INDEPENDENT_AMBULATORY_CARE_PROVIDER_SITE_OTHER): Payer: BLUE CROSS/BLUE SHIELD | Admitting: Pulmonary Disease

## 2016-03-09 VITALS — BP 116/66 | HR 80 | Ht 62.0 in | Wt 245.4 lb

## 2016-03-09 DIAGNOSIS — J302 Other seasonal allergic rhinitis: Secondary | ICD-10-CM | POA: Diagnosis not present

## 2016-03-09 DIAGNOSIS — J42 Unspecified chronic bronchitis: Secondary | ICD-10-CM | POA: Insufficient documentation

## 2016-03-09 DIAGNOSIS — J410 Simple chronic bronchitis: Secondary | ICD-10-CM | POA: Diagnosis not present

## 2016-03-09 DIAGNOSIS — J4550 Severe persistent asthma, uncomplicated: Secondary | ICD-10-CM | POA: Insufficient documentation

## 2016-03-09 DIAGNOSIS — J309 Allergic rhinitis, unspecified: Secondary | ICD-10-CM | POA: Insufficient documentation

## 2016-03-09 LAB — CBC WITH DIFFERENTIAL/PLATELET
BASOS ABS: 0.1 10*3/uL (ref 0.0–0.1)
Basophils Relative: 0.8 % (ref 0.0–3.0)
EOS ABS: 0.5 10*3/uL (ref 0.0–0.7)
Eosinophils Relative: 5.8 % — ABNORMAL HIGH (ref 0.0–5.0)
HEMATOCRIT: 40.3 % (ref 36.0–46.0)
Hemoglobin: 13.3 g/dL (ref 12.0–15.0)
LYMPHS PCT: 32.9 % (ref 12.0–46.0)
Lymphs Abs: 2.9 10*3/uL (ref 0.7–4.0)
MCHC: 33 g/dL (ref 30.0–36.0)
MCV: 81.1 fl (ref 78.0–100.0)
MONO ABS: 0.6 10*3/uL (ref 0.1–1.0)
Monocytes Relative: 6.3 % (ref 3.0–12.0)
NEUTROS ABS: 4.7 10*3/uL (ref 1.4–7.7)
Neutrophils Relative %: 54.2 % (ref 43.0–77.0)
Platelets: 333 10*3/uL (ref 150.0–400.0)
RBC: 4.98 Mil/uL (ref 3.87–5.11)
RDW: 15.3 % (ref 11.5–15.5)
WBC: 8.8 10*3/uL (ref 4.0–10.5)

## 2016-03-09 MED ORDER — BUDESONIDE-FORMOTEROL FUMARATE 80-4.5 MCG/ACT IN AERO: 2.0000 | INHALATION_SPRAY | Freq: Two times a day (BID) | RESPIRATORY_TRACT | Status: DC

## 2016-03-09 MED ORDER — ALBUTEROL SULFATE HFA 108 (90 BASE) MCG/ACT IN AERS: 2.0000 | INHALATION_SPRAY | RESPIRATORY_TRACT | Status: AC | PRN

## 2016-03-09 MED ORDER — MONTELUKAST SODIUM 10 MG PO TABS: 10.0000 mg | ORAL_TABLET | Freq: Every day | ORAL | Status: DC

## 2016-03-09 NOTE — Patient Instructions (Addendum)
   Use the Symbicort inhaler I am giving you every day. Inhale 2 puffs twice daily. Remember to rinse, gargle, spit, brush your teeth & brush your tongue after using it to keep from getting thrush.  Continue using your Flonase twice daily. Try using a nasal saline rinse like NeilMed which you can get at multiple pharmacies over-the-counter twice daily before your Flonase to help your sinuses.  If you have any problems with the medications & am starting call and let me know.  If your coughing & symptoms don't start to improve after a week or so please call and let me know so we can do further testing and adjust your appointment.  I will see you back in 2-3 months with a breathing test or sooner if needed.  TESTS ORDERED: 1. Serum lab tests today 2. Full pulmonary function testing at follow-up appointment

## 2016-03-09 NOTE — Progress Notes (Signed)
Subjective:    Patient ID: Carolyn Oconnor, female    DOB: 25-Jun-1976, 40 y.o.   MRN: [id number]  HPI She reports she developed breathing problems in her teens. She also had severe pneumonia in 2000. Patient remotely diagnosed with asthma & seen remotely in our office. She reports previously she has been treated with multiple inhalers and Prednisone in the past. She reports exacerbations on a yearly basis treated with Prednisone. She has never been intubated for any breathing problems. She has been hospitalized for asthma exacerbations in the past and last was in 2015. She reports bronchitis 3-4 times a year. She reports she coughs on a daily basis and wakes up coughing at night nearly every night. Cough is productive of a yellow in small amounts. She reports she has had hemoptysis 3-4 years ago. She also reports wheezing on a daily basis. Denies any chest tightness or pain but does have chest pressure off and on. She reports dyspnea & symptoms simply with walking. She reports worsening of her symptoms with heat & inhaled irritants. No fever, chills, or sweats. She reports daily sinus congestion & post-nasal drainage. It does seem to worsen with seasonal changes. No reflux, dyspepsia, or morning brash water taste. She denies any nasal polyps but has had increased dyspnea when taking aspirin in the past. She uses her albuterol inhaler 4-5 times daily. She is using Flonase twice daily.   Review of Systems No rashes or bruising. No adenopathy in her neck, groin, or axilla. A pertinent 14 point review of systems is negative except as per the history of presenting illness.   Allergies  Allergen Reactions  . Aspirin     Patient states that it makes her go into an asthma attack.     Current Outpatient Prescriptions on File Prior to Visit  Medication Sig Dispense Refill  . albuterol (PROVENTIL HFA;VENTOLIN HFA) 108 (90 BASE) MCG/ACT inhaler Inhale 2 puffs into the lungs every 4 (four) hours as needed.  For asthma attacks. 1 Inhaler 1  . fluticasone (FLONASE) 50 MCG/ACT nasal spray Place 2 sprays into both nostrils daily. 16 g 2  . ibuprofen (ADVIL,MOTRIN) 200 MG tablet Take 800 mg by mouth every 6 (six) hours as needed. For pain.     Marland Kitchen. ondansetron (ZOFRAN) 8 MG tablet Take 1 tablet (8 mg total) by mouth every 8 (eight) hours as needed for nausea or vomiting. 20 tablet 0   No current facility-administered medications on file prior to visit.    Past Medical History  Diagnosis Date  . Asthma   . Allergic rhinitis   . Pneumonia   . Bronchitis, chronic (HCC)     Past Surgical History  Procedure Laterality Date  . None      Family History  Problem Relation Age of Onset  . Allergies Mother   . Heart disease Mother   . Allergies Father   . Asthma Father   . Cancer Sister   . Asthma Sister   . Allergies Sister   . Heart disease Maternal Aunt   . Breast cancer Maternal Aunt   . Asthma Brother   . Allergies Brother     Social History   Social History  . Marital Status: Single    Spouse Name: N/A  . Number of Children: N/A  . Years of Education: N/A   Social History Main Topics  . Smoking status: Passive Smoke Exposure - Never Smoker  . Smokeless tobacco: Never Used     Comment:  Secondhand from Father  . Alcohol Use: No  . Drug Use: No  . Sexual Activity: Not Asked   Other Topics Concern  . None   Social History Narrative   Single, lives with boyfriend   CNA, Editor, commissioning   No recent travel      Adult nurse Pulmonary:   Originally from [place]. Always lived in [place]. Previously hasn't traveled outside of the state. 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 No pets currently. She does have carpet & draperies in her home as well as the bedroom. No indoor plants. No bird exposure. No mold exposure. Enjoys drawing & reading.       Objective:   Physical Exam BP 116/66 mmHg  Pulse 80  Ht  (1.575 m)  Wt 245 lb 6.4 oz (111.313 kg)   BMI 44.87 kg/m2  SpO2 97% General:  Awake. Alert. No acute distress. Obese.  Integument:  Warm & dry. No rash on exposed skin. No bruising. Lymphatics:  No appreciated cervical or supraclavicular lymphadenoapthy. HEENT:  Moist mucus membranes. No oral ulcers. No scleral injection or icterus. Moderate bilateral nasal turbinate swelling with pale mucosa. Mallampati class III. Cardiovascular:  Regular rate. No edema. No appreciable JVD.  Pulmonary:  Good aeration & clear to auscultation bilaterally; however, slightly diminished in the bases. Symmetric chest wall expansion. No accessory muscle use. Abdomen: Soft. Normal bowel sounds. Nondistended. Grossly nontender. Musculoskeletal:  Normal bulk and tone. Hand grip strength 5/5 bilaterally. No joint deformity or effusion appreciated. Neurological:  CN 2-12 grossly in tact. No meningismus. Moving all 4 extremities equally. Symmetric brachioradialis deep tendon reflexes. Psychiatric:  Mood and affect congruent. Speech normal rhythm, rate & tone.    IMAGING CXR PA/LAT 11/16/14 (personally reviewed by me): No focal opacity or effusion. No nodule appreciated. Heart normal in size. Mediastinum normal in contour.    Assessment & Plan:  40 year old female previously diagnosed with asthma with ongoing symptoms and significant allergic rhinitis. I believe the patient's history of chronic bronchitis is likely possibly due to intermittent exacerbations of her underlying asthma.. The frequency and severity of her symptoms she does have severe, persistent asthma. I am starting the patient on inhaled corticosteroid therapy as well as long-acting bronchodilator. I suspect her sensitivity to aspirin is related to her underlying asthma but she has no history of nasal polyps to [facility]. She has no symptoms of reflux that would explain her ongoing cough either and an atypical infectious process is certainly possible. However in the absence of symptoms  otherwise I do not feel that antibiotic therapy is necessary. I instructed the patient contact my office if she was not clinically improving after 1-2 weeks so that we may further adjust her treatment regimen and timing of follow-up.  1. Severe, Persistent Asthma: Checking full supportive function testing at follow-up appointment. Starting patient on Symbicort 80/4.5. Patient instructed on proper oral hygiene. Starting Singulair 10 mg by mouth daily at bedtime. Checking serum CBC with differential, IgE, & RAST panel. 2. Chronic Bronchitis: Checking sputum culture for AFB, fungus, and bacteria. Checking quantitative immunoglobulin panel. 3. Allergic Rhinitis: Strength patient on cellular 10 mg by mouth daily at bedtime. Patient instructed to use a nasal saline rinse twice daily while continuing to use Flonase afterward. Checking serum IgE, RAST panel, & CBC with differential. Holding off on maxillofacial CT scan. 4. Follow-up: Patient to return to clinic in 2-3 months or sooner if needed.  Donna Christen Jamison Neighbor, M.D. [facility] Pager:  [phone] After 3pm or if no response, call [phone] 9:44 AM 03/09/2016

## 2016-03-12 LAB — RESPIRATORY ALLERGY PROFILE REGION II ~~LOC~~
ALLERGEN, CEDAR TREE, T6: 0.72 kU/L — AB
ALLERGEN, COMM SILVER BIRCH, T3: 1.23 kU/L — AB
ALLERGEN, OAK, T7: 1.37 kU/L — AB
Allergen, Cottonwood, t14: 1.03 kU/L — ABNORMAL HIGH
Allergen, D pternoyssinus,d7: 0.47 kU/L — ABNORMAL HIGH
Allergen, Mulberry, t76: 0.8 kU/L — ABNORMAL HIGH
Aspergillus fumigatus, m3: <0.1 kU/L
BERMUDA GRASS: 2.57 kU/L — AB
BOX ELDER: 1.33 kU/L — AB
Cat Dander: 2.32 kU/L — ABNORMAL HIGH
Cladosporium Herbarum: <0.1 kU/L
Cockroach: 2.49 kU/L — ABNORMAL HIGH
Common Ragweed: 1.5 kU/L — ABNORMAL HIGH
D. farinae: 0.43 kU/L — ABNORMAL HIGH
Dog Dander: 0.26 kU/L — ABNORMAL HIGH
Elm IgE: 1.82 kU/L — ABNORMAL HIGH
IgE (Immunoglobulin E), Serum: 534 kU/L — ABNORMAL HIGH (ref <115)
Johnson Grass: 3.36 kU/L — ABNORMAL HIGH
Pecan/Hickory Tree IgE: 1.09 kU/L — ABNORMAL HIGH
Penicillium Notatum: <0.1 kU/L
Rough Pigweed  IgE: 1.18 kU/L — ABNORMAL HIGH
Sheep Sorrel IgE: 1.37 kU/L — ABNORMAL HIGH
TIMOTHY GRASS: 6.63 kU/L — AB

## 2016-03-13 LAB — IGG, IGA, IGM
IgA: 145 mg/dL (ref 81–463)
IgG (Immunoglobin G), Serum: 1436 mg/dL (ref 694–1618)
IgM, Serum: 43 mg/dL — ABNORMAL LOW (ref 48–271)

## 2016-06-01 ENCOUNTER — Ambulatory Visit: Payer: BLUE CROSS/BLUE SHIELD | Admitting: Pulmonary Disease

## 2016-06-01 ENCOUNTER — Telehealth: Payer: Self-pay | Admitting: Pulmonary Disease

## 2016-06-01 NOTE — Telephone Encounter (Signed)
LABS 03/09/16 IgG:  1436 IgA:  145 IgM:  43 CBC: 8.8/13.3/40.3/333  Eosinophils: 0.5 IgE:  534 RAST Panel:  [place] Grass 2.57 / Cat Dander 2.32 / Cockroach 2.49 / Common Ragweed 1.50 / Johnson Grass 3.36 / Timothy Grass 6.63 / Elm 1.82 / Multiple other positives

## 2016-07-04 ENCOUNTER — Encounter (HOSPITAL_COMMUNITY): Payer: Self-pay | Admitting: *Deleted

## 2016-07-04 ENCOUNTER — Emergency Department (HOSPITAL_COMMUNITY)
Admission: EM | Admit: 2016-07-04 | Discharge: 2016-07-04 | Disposition: A | Discharge status: Home / Self Care | Payer: BLUE CROSS/BLUE SHIELD | Attending: Emergency Medicine | Admitting: Emergency Medicine

## 2016-07-04 ENCOUNTER — Emergency Department (HOSPITAL_COMMUNITY): Payer: BLUE CROSS/BLUE SHIELD

## 2016-07-04 DIAGNOSIS — R0789 Other chest pain: Secondary | ICD-10-CM | POA: Diagnosis not present

## 2016-07-04 DIAGNOSIS — R079 Chest pain, unspecified: Secondary | ICD-10-CM | POA: Diagnosis present

## 2016-07-04 DIAGNOSIS — Z7722 Contact with and (suspected) exposure to environmental tobacco smoke (acute) (chronic): Secondary | ICD-10-CM | POA: Insufficient documentation

## 2016-07-04 DIAGNOSIS — J45909 Unspecified asthma, uncomplicated: Secondary | ICD-10-CM | POA: Diagnosis not present

## 2016-07-04 DIAGNOSIS — Z79899 Other long term (current) drug therapy: Secondary | ICD-10-CM | POA: Insufficient documentation

## 2016-07-04 LAB — BASIC METABOLIC PANEL
ANION GAP: 15 (ref 5–15)
BUN: 17 mg/dL (ref 6–20)
CHLORIDE: 103 mmol/L (ref 101–111)
CO2: 18 mmol/L — ABNORMAL LOW (ref 22–32)
CREATININE: 0.97 mg/dL (ref 0.44–1.00)
Calcium: 9.1 mg/dL (ref 8.9–10.3)
GFR calc non Af Amer: >60 mL/min (ref >60)
Glucose, Bld: 109 mg/dL — ABNORMAL HIGH (ref 65–99)
POTASSIUM: 3.2 mmol/L — AB (ref 3.5–5.1)
SODIUM: 136 mmol/L (ref 135–145)

## 2016-07-04 LAB — CBC
HEMATOCRIT: 40 % (ref 36.0–46.0)
HEMOGLOBIN: 13.4 g/dL (ref 12.0–15.0)
MCH: 27.1 pg (ref 26.0–34.0)
MCHC: 33.5 g/dL (ref 30.0–36.0)
MCV: 81 fL (ref 78.0–100.0)
PLATELETS: 364 10*3/uL (ref 150–400)
RBC: 4.94 MIL/uL (ref 3.87–5.11)
RDW: 14.5 % (ref 11.5–15.5)
WBC: 12.3 10*3/uL — AB (ref 4.0–10.5)

## 2016-07-04 LAB — D-DIMER, QUANTITATIVE (NOT AT ARMC): D DIMER QUANT: 0.43 ug{FEU}/mL (ref 0.00–0.50)

## 2016-07-04 LAB — LIPASE, BLOOD: LIPASE: 28 U/L (ref 11–51)

## 2016-07-04 LAB — I-STAT TROPONIN, ED: Troponin i, poc: 0 ng/mL (ref 0.00–0.08)

## 2016-07-04 MED ORDER — GI COCKTAIL ~~LOC~~
30.0000 mL | Freq: Once | ORAL | Status: AC
  Administered 2016-07-04: 30 mL via ORAL
  Filled 2016-07-04: qty 30

## 2016-07-04 MED ORDER — OMEPRAZOLE 20 MG PO CPDR: 20.0000 mg | DELAYED_RELEASE_CAPSULE | Freq: Every day | ORAL | 0 refills | Status: DC

## 2016-07-04 NOTE — Discharge Instructions (Signed)
Your chest pain may be related to gastric reflux.

## 2016-07-04 NOTE — ED Provider Notes (Signed)
[facility]-EMERGENCY DEPT Provider Note   CSN: [id number] Arrival date & time: 07/04/16  0222     History   Chief Complaint Chief Complaint  Patient presents with  . Chest Pain  . Dizziness  . Nausea  . Emesis    HPI Carolyn Oconnor is a 40 y.o. female.  HPI  This is a 40 year old female who presents with chest pain. Patient reports that she's had 2 week history of waxing and waning chest pain. She describes it as dull. Radiating upwards into the left arm. He comes and goes. No association with exertion. Seems to be worse at night and when laying flat. No food association. Currently her pain is 0. She has had some associated vomiting. Reports shortness of breath.  Denies diaphoresis. She has not taken anything for her symptoms.  Past Medical History:  Diagnosis Date  . Allergic rhinitis   . Asthma   . Bronchitis, chronic (HCC)   . Pneumonia     Patient Active Problem List   Diagnosis Date Noted  . Severe persistent asthma 03/09/2016  . Chronic bronchitis (HCC) 03/09/2016  . Allergic rhinitis 03/09/2016    Past Surgical History:  Procedure Laterality Date  . none      OB History    No data available       Home Medications    Prior to Admission medications   Medication Sig Start Date End Date Taking? Authorizing Provider  albuterol (PROVENTIL HFA;VENTOLIN HFA) 108 (90 Base) MCG/ACT inhaler Inhale 2 puffs into the lungs every 4 (four) hours as needed (cough and wheeze). For asthma attacks. 03/09/16  Yes Roslynn Amble, MD  budesonide-formoterol [facility]) 80-4.5 MCG/ACT inhaler Inhale 2 puffs into the lungs 2 (two) times daily. Patient taking differently: Inhale 2 puffs into the lungs 2 (two) times daily as needed (shortness of breath).  03/09/16  Yes Roslynn Amble, MD  fluticasone (FLONASE) 50 MCG/ACT nasal spray Place 2 sprays into both nostrils daily. Patient taking differently: Place 2 sprays into both nostrils daily as needed for allergies.  01/22/14  Yes  Rodolph Bong, MD  ibuprofen (ADVIL,MOTRIN) 200 MG tablet Take 800 mg by mouth every 6 (six) hours as needed for moderate pain.    Yes Historical Provider, MD  montelukast (SINGULAIR) 10 MG tablet Take 1 tablet (10 mg total) by mouth at bedtime. 03/09/16  Yes Roslynn Amble, MD  ondansetron (ZOFRAN) 8 MG tablet Take 1 tablet (8 mg total) by mouth every 8 (eight) hours as needed for nausea or vomiting. 01/22/14  Yes Rodolph Bong, MD  omeprazole (PRILOSEC) 20 MG capsule Take 1 capsule (20 mg total) by mouth daily. 07/04/16   Shon Baton, MD    Family History Family History  Problem Relation Age of Onset  . Allergies Mother   . Heart disease Mother   . Allergies Father   . Asthma Father   . Cancer Sister   . Asthma Sister   . Allergies Sister   . Heart disease Maternal Aunt   . Breast cancer Maternal Aunt   . Asthma Brother   . Allergies Brother     Social History Social History  Substance Use Topics  . Smoking status: Passive Smoke Exposure - Never Smoker  . Smokeless tobacco: Never Used     Comment: Secondhand from Father  . Alcohol use No     Allergies   Aspirin   Review of Systems Review of Systems  Constitutional: Negative for fever.  Respiratory:  Positive for shortness of breath. Negative for cough.   Cardiovascular: Positive for chest pain.  Gastrointestinal: Positive for nausea and vomiting. Negative for abdominal pain and diarrhea.  All other systems reviewed and are negative.    Physical Exam Updated Vital Signs BP 127/74 (BP Location: Right Arm)   Pulse 81   Temp 98.6 F (37 C) (Oral)   Resp 24   Ht 5\' 2"  (1.575 m)   Wt 237 lb (107.5 kg)   SpO2 98%   BMI 43.35 kg/m   Physical Exam  Constitutional: She is oriented to person, place, and time. She appears well-developed and well-nourished.  Obese  HENT:  Head: Normocephalic and atraumatic.  Eyes: Pupils are equal, round, and reactive to light.  Cardiovascular: Normal rate, regular rhythm and  normal heart sounds.   No murmur heard. Pulmonary/Chest: Effort normal. No respiratory distress. She has no wheezes. She exhibits no tenderness.  Abdominal: Soft. Bowel sounds are normal. There is no tenderness.  Neurological: She is alert and oriented to person, place, and time.  Skin: Skin is warm and dry.  Psychiatric: She has a normal mood and affect.  Nursing note and vitals reviewed.    ED Treatments / Results  Labs (all labs ordered are listed, but only abnormal results are displayed) Labs Reviewed  BASIC METABOLIC PANEL - Abnormal; Notable for the following:       Result Value   Potassium 3.2 (*)    CO2 18 (*)    Glucose, Bld 109 (*)    All other components within normal limits  CBC - Abnormal; Notable for the following:    WBC 12.3 (*)    All other components within normal limits  LIPASE, BLOOD  D-DIMER, QUANTITATIVE (NOT AT [facility])  Rosezena SensorI-STAT TROPOININ, ED    EKG  EKG Interpretation  Date/Time:  Wednesday July 04 2016 02:24:39 EDT Ventricular Rate:  115 PR Interval:  148 QRS Duration: 82 QT Interval:  352 QTC Calculation: 486 R Axis:   17 Text Interpretation:  Sinus tachycardia Otherwise normal ECG Confirmed by Wilkie AyeHORTON  MD, Toni AmendOURTNEY ([id number]) on 07/04/2016 5:12:28 AM Also confirmed by Wilkie AyeHORTON  MD, COURTNEY ([id number]), editor [place]  CCT, BEVERLY ([id number])  on 07/04/2016 7:18:26 AM       Radiology Dg Chest 2 View  Result Date: 07/04/2016 CLINICAL DATA:  40 year old female with shortness of breath and chest pain EXAM: CHEST  2 VIEW COMPARISON:  Chest radiograph dated 11/16/2014 FINDINGS: The heart size and mediastinal contours are within normal limits. Both lungs are clear. The visualized skeletal structures are unremarkable. IMPRESSION: No active cardiopulmonary disease. Electronically Signed   By: Elgie CollardArash  Radparvar M.D.   On: 07/04/2016 02:57    Procedures Procedures (including critical care time)  Medications Ordered in ED Medications  gi cocktail  (Maalox,Lidocaine,Donnatal) (30 mLs Oral Given 07/04/16 [id number])     Initial Impression / Assessment and Plan / ED Course  I have reviewed the triage vital signs and the nursing notes.  Pertinent labs & imaging results that were available during my care of the patient were reviewed by me and considered in my medical decision making (see chart for details).  Clinical Course    Patient presents with two-week history of chest pain. Currently chest pain-free. No exertional symptoms. Atypical for ACS. She is mildly tachycardic. D-dimer was sent.  D-dimer is negative. Troponin negative. EKG is nonischemic. Patient was given a GI cocktail given worsening of symptoms at night. Patient states that she thinks it may have  helped. Will place on omeprazole.  After history, exam, and medical workup I feel the patient has been appropriately medically screened and is safe for discharge home. Pertinent diagnoses were discussed with the patient. Patient was given return precautions.   Final Clinical Impressions(s) / ED Diagnoses   Final diagnoses:  Other chest pain    New Prescriptions Discharge Medication List as of 07/04/2016  7:14 AM    START taking these medications   Details  omeprazole (PRILOSEC) 20 MG capsule Take 1 capsule (20 mg total) by mouth daily., Starting Wed 07/04/2016, Print         Shon Baton, MD 07/04/16 [phone]

## 2016-07-04 NOTE — ED Triage Notes (Signed)
Pt reports n/v, CP radiating to left arm for over a week. Pt reports vomiting x 3 today. Pt reports some shortness of breath with the CP

## 2016-09-05 ENCOUNTER — Ambulatory Visit (HOSPITAL_COMMUNITY)
Admission: EM | Admit: 2016-09-05 | Discharge: 2016-09-05 | Disposition: A | Discharge status: Home / Self Care | Payer: BLUE CROSS/BLUE SHIELD | Attending: Family Medicine | Admitting: Family Medicine

## 2016-09-05 ENCOUNTER — Encounter (HOSPITAL_COMMUNITY): Payer: Self-pay | Admitting: Family Medicine

## 2016-09-05 DIAGNOSIS — K0500 Acute gingivitis, plaque induced: Secondary | ICD-10-CM

## 2016-09-05 DIAGNOSIS — K047 Periapical abscess without sinus: Secondary | ICD-10-CM | POA: Diagnosis not present

## 2016-09-05 MED ORDER — CLINDAMYCIN HCL 150 MG PO CAPS: 150.0000 mg | ORAL_CAPSULE | Freq: Four times a day (QID) | ORAL | 1 refills | Status: DC

## 2016-09-05 MED ORDER — CHLORHEXIDINE GLUCONATE 0.12 % MT SOLN: 10.0000 mL | Freq: Four times a day (QID) | OROMUCOSAL | 1 refills | Status: DC

## 2016-09-05 NOTE — ED Provider Notes (Signed)
[facility]    CSN: [id number] Arrival date & time: 09/05/16  1219     History   Chief Complaint Chief Complaint  Patient presents with  . Dental Problem    HPI Carolyn Oconnor is a 40 y.o. female.   The history is provided by the patient.  Dental Pain  Location:  Upper Upper teeth location:  13/LU 2nd bicuspid Quality:  Throbbing Severity:  Moderate Onset quality:  Sudden Duration:  4 days Chronicity:  New Context: abscess and dental caries   Relieved by:  None tried Worsened by:  Nothing Ineffective treatments:  None tried Associated symptoms: facial swelling and gum swelling   Associated symptoms: no fever   Risk factors: lack of dental care and periodontal disease     Past Medical History:  Diagnosis Date  . Allergic rhinitis   . Asthma   . Bronchitis, chronic (HCC)   . Pneumonia     Patient Active Problem List   Diagnosis Date Noted  . Severe persistent asthma 03/09/2016  . Chronic bronchitis (HCC) 03/09/2016  . Allergic rhinitis 03/09/2016    Past Surgical History:  Procedure Laterality Date  . none      OB History    No data available       Home Medications    Prior to Admission medications   Medication Sig Start Date End Date Taking? Authorizing Provider  albuterol (PROVENTIL HFA;VENTOLIN HFA) 108 (90 Base) MCG/ACT inhaler Inhale 2 puffs into the lungs every 4 (four) hours as needed (cough and wheeze). For asthma attacks. 03/09/16   Roslynn AmbleJennings E Nestor, MD  budesonide-formoterol [facility](SYMBICORT) 80-4.5 MCG/ACT inhaler Inhale 2 puffs into the lungs 2 (two) times daily. Patient taking differently: Inhale 2 puffs into the lungs 2 (two) times daily as needed (shortness of breath).  03/09/16   Roslynn AmbleJennings E Nestor, MD  fluticasone (FLONASE) 50 MCG/ACT nasal spray Place 2 sprays into both nostrils daily. Patient taking differently: Place 2 sprays into both nostrils daily as needed for allergies.  01/22/14   Rodolph BongEvan S Corey, MD  ibuprofen  (ADVIL,MOTRIN) 200 MG tablet Take 800 mg by mouth every 6 (six) hours as needed for moderate pain.     Historical Provider, MD  montelukast (SINGULAIR) 10 MG tablet Take 1 tablet (10 mg total) by mouth at bedtime. 03/09/16   Roslynn AmbleJennings E Nestor, MD  omeprazole (PRILOSEC) 20 MG capsule Take 1 capsule (20 mg total) by mouth daily. 07/04/16   Shon Batonourtney F Horton, MD  ondansetron (ZOFRAN) 8 MG tablet Take 1 tablet (8 mg total) by mouth every 8 (eight) hours as needed for nausea or vomiting. 01/22/14   Rodolph BongEvan S Corey, MD    Family History Family History  Problem Relation Age of Onset  . Allergies Mother   . Heart disease Mother   . Allergies Father   . Asthma Father   . Cancer Sister   . Asthma Sister   . Allergies Sister   . Heart disease Maternal Aunt   . Breast cancer Maternal Aunt   . Asthma Brother   . Allergies Brother     Social History Social History  Substance Use Topics  . Smoking status: Passive Smoke Exposure - Never Smoker  . Smokeless tobacco: Never Used     Comment: Secondhand from Father  . Alcohol use No     Allergies   Aspirin   Review of Systems Review of Systems  Constitutional: Negative.  Negative for fever.  HENT: Positive for dental problem  and facial swelling.   All other systems reviewed and are negative.    Physical Exam Triage Vital Signs ED Triage Vitals [09/05/16 1248]  Enc Vitals Group     BP 123/75     Pulse Rate 80     Resp 18     Temp 99.2 F (37.3 C)     Temp src      SpO2 98 %     Weight      Height      Head Circumference      Peak Flow      Pain Score 4     Pain Loc      Pain Edu?      Excl. in GC?    No data found.   Updated Vital Signs BP 123/75   Pulse 80   Temp 99.2 F (37.3 C)   Resp 18   SpO2 98%   Visual Acuity Right Eye Distance:   Left Eye Distance:   Bilateral Distance:    Right Eye Near:   Left Eye Near:    Bilateral Near:     Physical Exam  Constitutional: She appears well-developed and  well-nourished. She appears distressed.  HENT:  Mouth/Throat: Abnormal dentition. Dental abscesses and dental caries present.       UC Treatments / Results  Labs (all labs ordered are listed, but only abnormal results are displayed) Labs Reviewed - No data to display  EKG  EKG Interpretation None       Radiology No results found.  Procedures Procedures (including critical care time)  Medications Ordered in UC Medications - No data to display   Initial Impression / Assessment and Plan / UC Course  I have reviewed the triage vital signs and the nursing notes.  Pertinent labs & imaging results that were available during my care of the patient were reviewed by me and considered in my medical decision making (see chart for details).  Clinical Course       Final Clinical Impressions(s) / UC Diagnoses   Final diagnoses:  None    New Prescriptions New Prescriptions   No medications on file     Linna HoffJames D Kindl, MD 09/05/16 2013

## 2016-09-05 NOTE — Discharge Instructions (Signed)
Take medicine as prescribed, see your dentist as soon as possible °

## 2016-09-05 NOTE — ED Triage Notes (Signed)
Pt here for left upper dental pain and oral swelling. sts she has a bad tooth.

## 2018-08-03 IMAGING — DX DG CHEST 2V
2 series · 2 of 2 positions shown · non-contrast
Comparison: Chest radiograph dated 11/16/2014

CLINICAL DATA: 39-year-old female with shortness of breath and
chest pain

EXAM:
CHEST  2 VIEW

[chest pa]
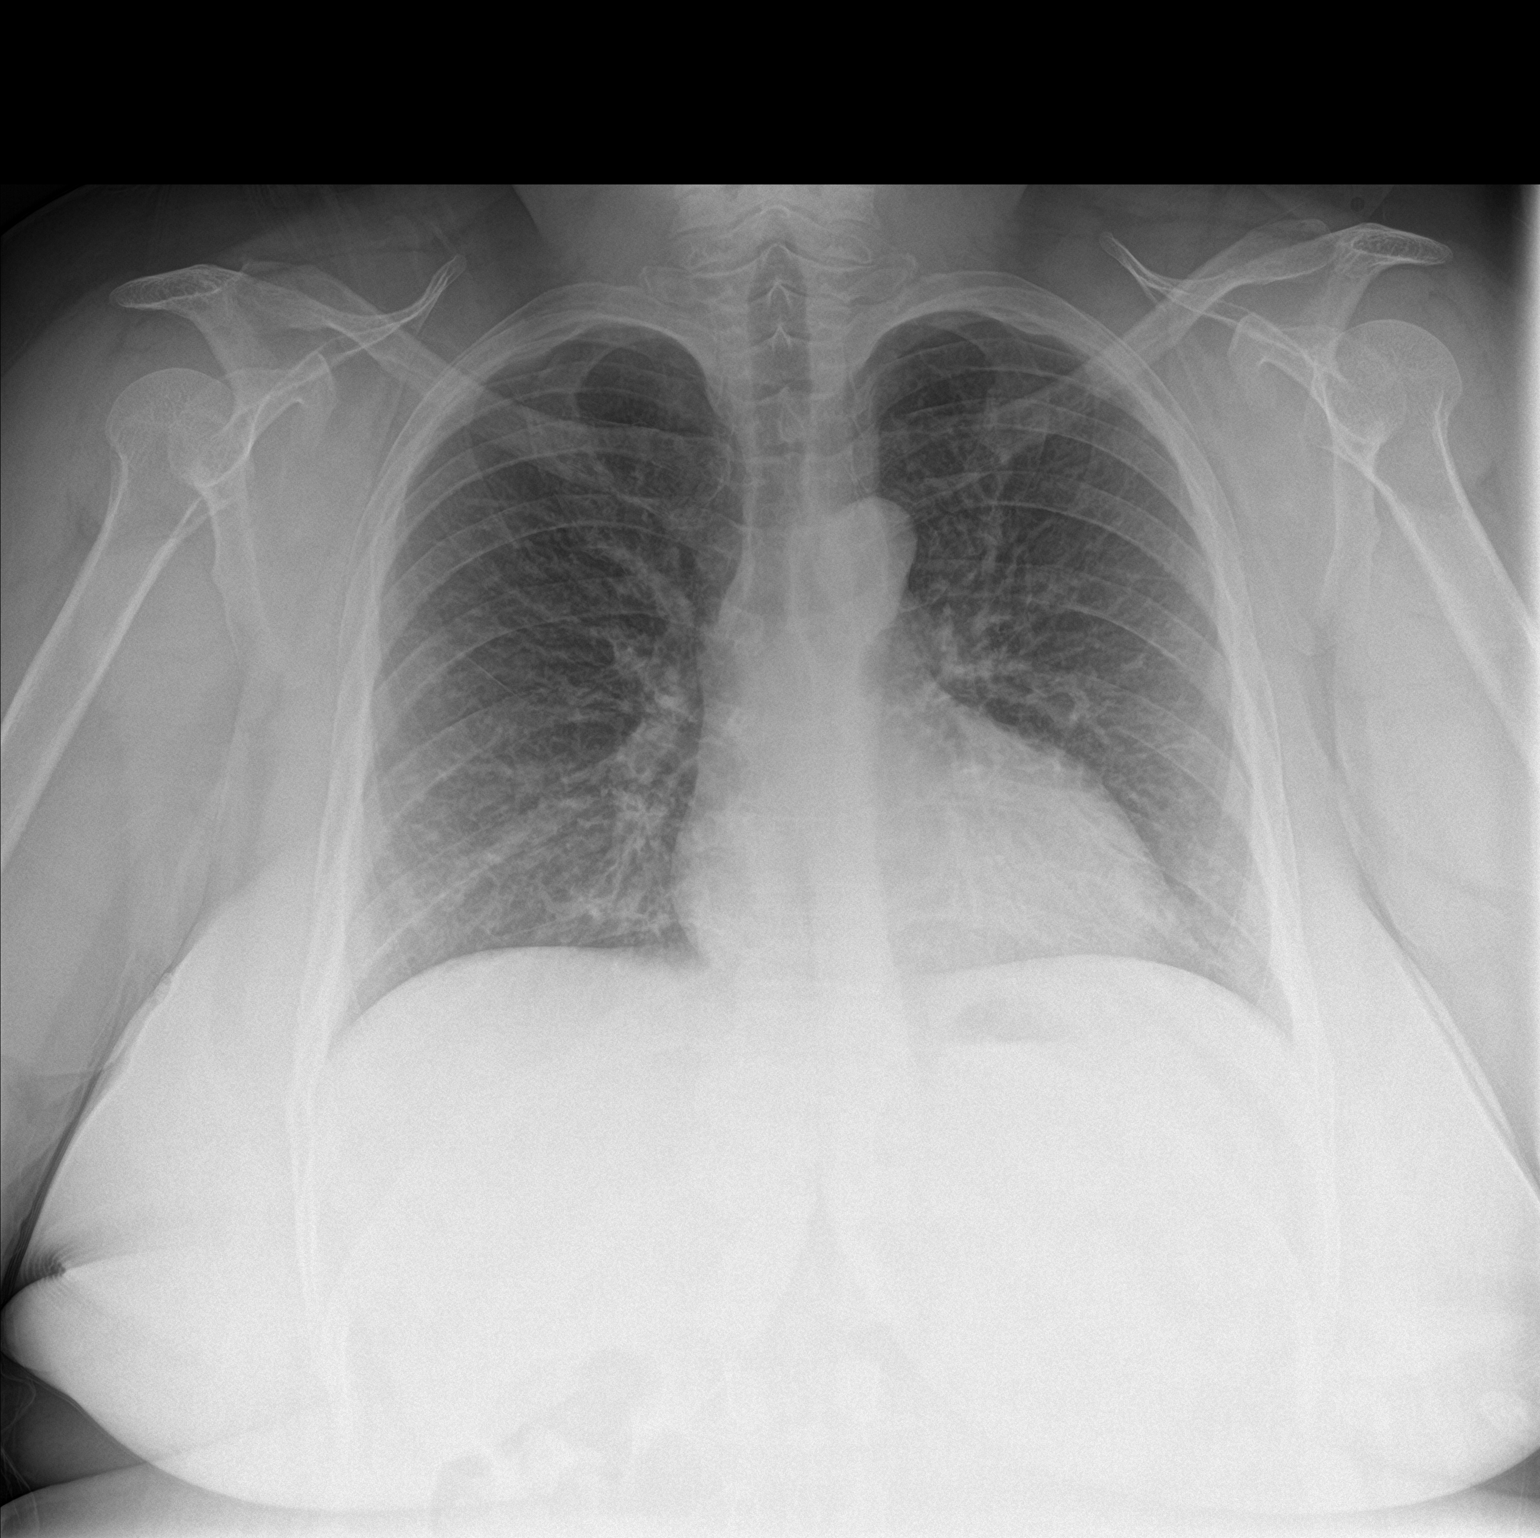

[chest lat]
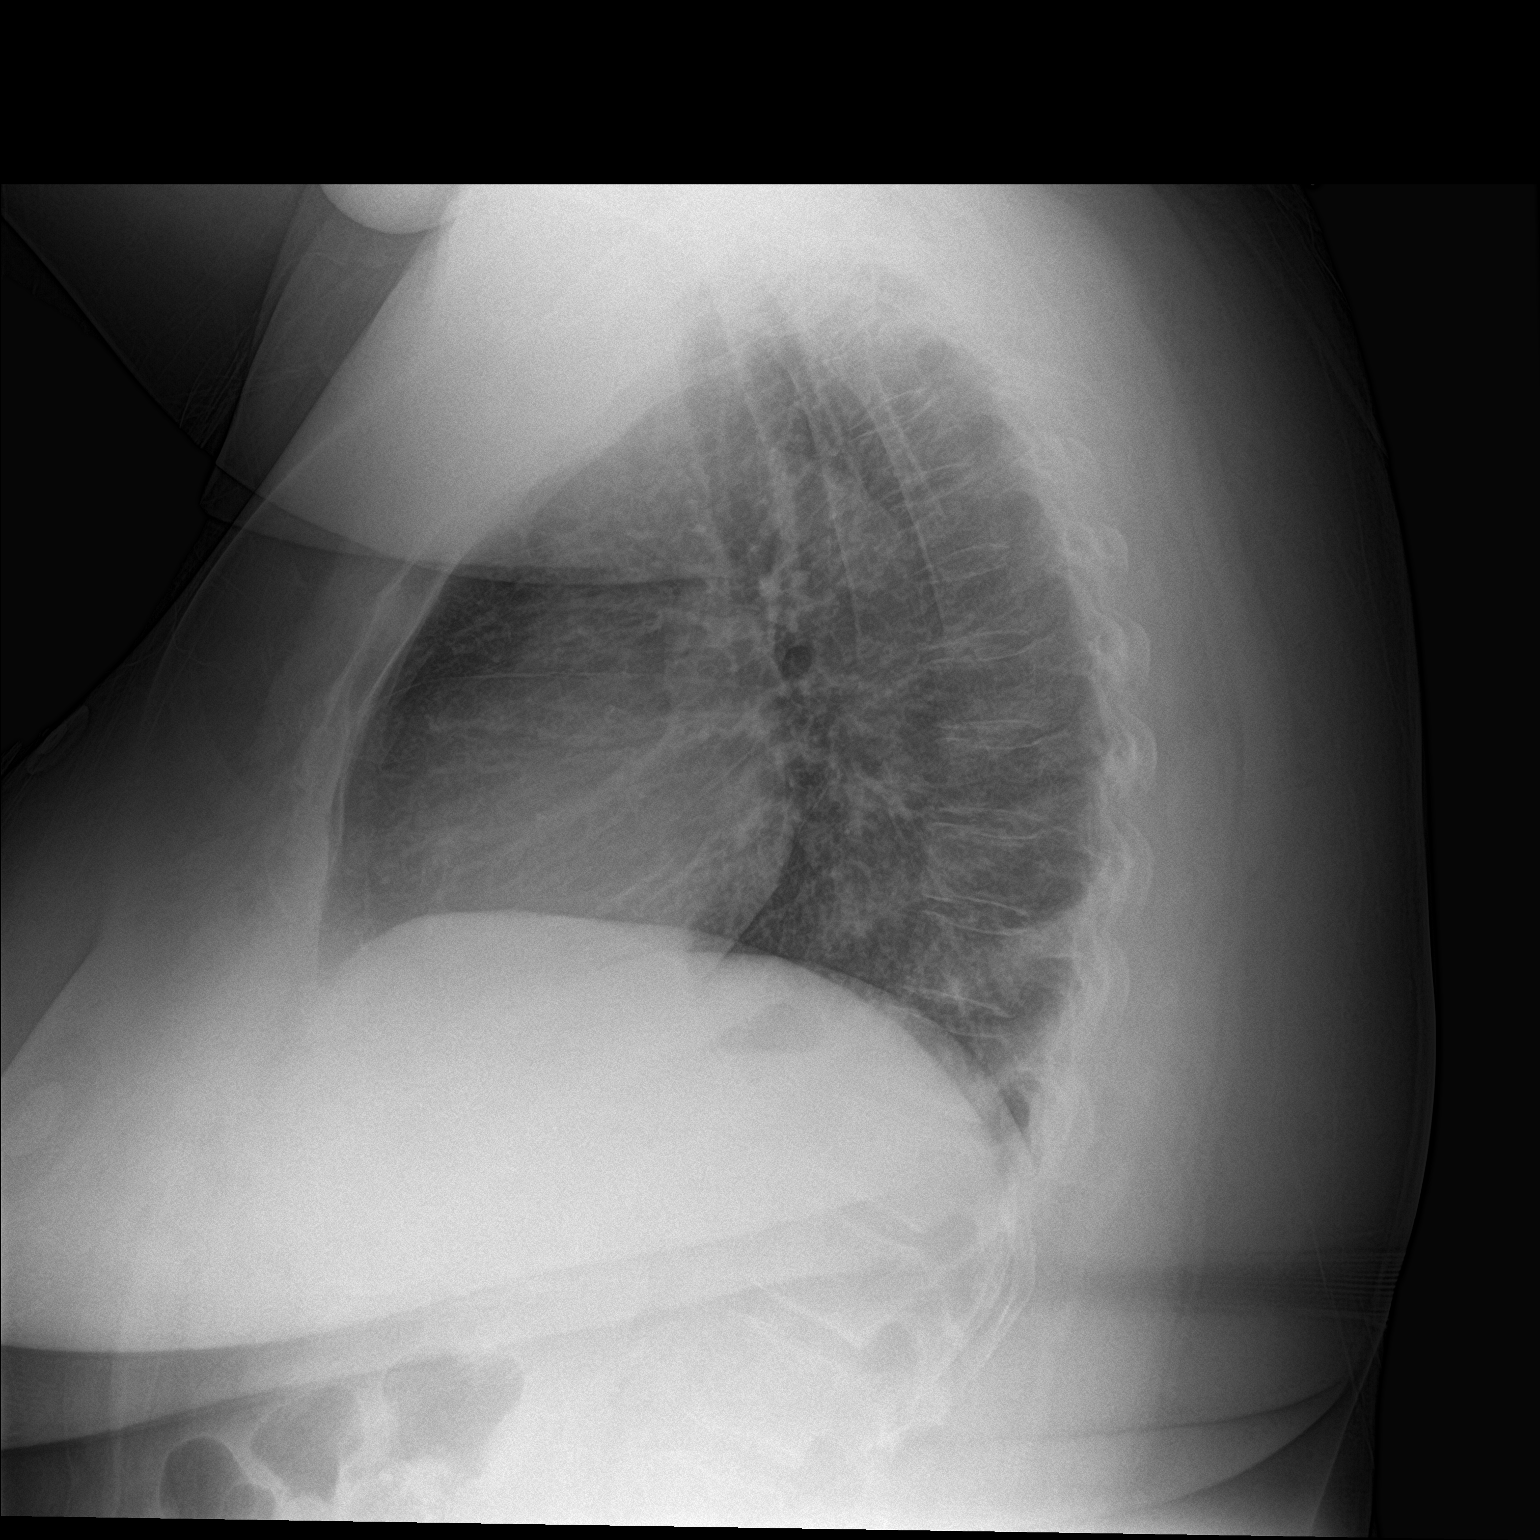

[2 of 2 positions shown; findings below may reference images not displayed]

FINDINGS: The heart size and mediastinal contours are within normal limits.
Both lungs are clear. The visualized skeletal structures are
unremarkable.
IMPRESSION: No active cardiopulmonary disease.

## 2023-02-26 ENCOUNTER — Inpatient Hospital Stay (HOSPITAL_COMMUNITY): Payer: Self-pay

## 2023-02-26 ENCOUNTER — Inpatient Hospital Stay (HOSPITAL_COMMUNITY)
Admission: EM | Admit: 2023-02-26 | Discharge: 2023-03-04 | DRG: 287 | Disposition: A | Discharge status: Home / Self Care | Payer: Self-pay | Attending: Internal Medicine | Admitting: Internal Medicine

## 2023-02-26 ENCOUNTER — Emergency Department (HOSPITAL_COMMUNITY): Payer: Self-pay

## 2023-02-26 ENCOUNTER — Other Ambulatory Visit: Payer: Self-pay

## 2023-02-26 DIAGNOSIS — I5021 Acute systolic (congestive) heart failure: Principal | ICD-10-CM | POA: Insufficient documentation

## 2023-02-26 DIAGNOSIS — E66813 Obesity, class 3: Secondary | ICD-10-CM

## 2023-02-26 DIAGNOSIS — Z1152 Encounter for screening for COVID-19: Secondary | ICD-10-CM

## 2023-02-26 DIAGNOSIS — Z8249 Family history of ischemic heart disease and other diseases of the circulatory system: Secondary | ICD-10-CM

## 2023-02-26 DIAGNOSIS — Z7722 Contact with and (suspected) exposure to environmental tobacco smoke (acute) (chronic): Secondary | ICD-10-CM | POA: Diagnosis present

## 2023-02-26 DIAGNOSIS — E872 Acidosis, unspecified: Secondary | ICD-10-CM | POA: Diagnosis present

## 2023-02-26 DIAGNOSIS — N179 Acute kidney failure, unspecified: Secondary | ICD-10-CM

## 2023-02-26 DIAGNOSIS — J45901 Unspecified asthma with (acute) exacerbation: Secondary | ICD-10-CM

## 2023-02-26 DIAGNOSIS — Z7951 Long term (current) use of inhaled steroids: Secondary | ICD-10-CM

## 2023-02-26 DIAGNOSIS — I509 Heart failure, unspecified: Secondary | ICD-10-CM

## 2023-02-26 DIAGNOSIS — Z5986 Financial insecurity: Secondary | ICD-10-CM

## 2023-02-26 DIAGNOSIS — Z79899 Other long term (current) drug therapy: Secondary | ICD-10-CM

## 2023-02-26 DIAGNOSIS — D72829 Elevated white blood cell count, unspecified: Secondary | ICD-10-CM | POA: Diagnosis present

## 2023-02-26 DIAGNOSIS — R000 Tachycardia, unspecified: Secondary | ICD-10-CM | POA: Diagnosis present

## 2023-02-26 DIAGNOSIS — D75839 Thrombocytosis, unspecified: Secondary | ICD-10-CM | POA: Diagnosis present

## 2023-02-26 DIAGNOSIS — Z825 Family history of asthma and other chronic lower respiratory diseases: Secondary | ICD-10-CM

## 2023-02-26 DIAGNOSIS — I081 Rheumatic disorders of both mitral and tricuspid valves: Secondary | ICD-10-CM | POA: Diagnosis present

## 2023-02-26 DIAGNOSIS — E8809 Other disorders of plasma-protein metabolism, not elsewhere classified: Secondary | ICD-10-CM | POA: Diagnosis present

## 2023-02-26 DIAGNOSIS — I428 Other cardiomyopathies: Secondary | ICD-10-CM | POA: Diagnosis present

## 2023-02-26 DIAGNOSIS — Z6841 Body Mass Index (BMI) 40.0 and over, adult: Secondary | ICD-10-CM

## 2023-02-26 LAB — CBC
HCT: 40.1 % (ref 36.0–46.0)
Hemoglobin: 13.4 g/dL (ref 12.0–15.0)
MCH: 28.5 pg (ref 26.0–34.0)
MCHC: 33.4 g/dL (ref 30.0–36.0)
MCV: 85.3 fL (ref 80.0–100.0)
Platelets: 277 10*3/uL (ref 150–400)
RBC: 4.7 MIL/uL (ref 3.87–5.11)
RDW: 15.1 % (ref 11.5–15.5)
WBC: 9 10*3/uL (ref 4.0–10.5)
nRBC: 0 % (ref 0.0–0.2)

## 2023-02-26 LAB — HIV ANTIBODY (ROUTINE TESTING W REFLEX): HIV Screen 4th Generation wRfx: NONREACTIVE

## 2023-02-26 LAB — BASIC METABOLIC PANEL
Anion gap: 12 (ref 5–15)
BUN: 18 mg/dL (ref 6–20)
CO2: 20 mmol/L — ABNORMAL LOW (ref 22–32)
Calcium: 9.1 mg/dL (ref 8.9–10.3)
Chloride: 107 mmol/L (ref 98–111)
Creatinine, Ser: 0.94 mg/dL (ref 0.44–1.00)
GFR, Estimated: >60 mL/min (ref >60)
Glucose, Bld: 109 mg/dL — ABNORMAL HIGH (ref 70–99)
Potassium: 4.2 mmol/L (ref 3.5–5.1)
Sodium: 139 mmol/L (ref 135–145)

## 2023-02-26 LAB — TROPONIN I (HIGH SENSITIVITY)
Troponin I (High Sensitivity): 16 ng/L (ref <18)
Troponin I (High Sensitivity): 16 ng/L (ref <18)

## 2023-02-26 LAB — RESP PANEL BY RT-PCR (RSV, FLU A&B, COVID)  RVPGX2
Influenza A by PCR: NEGATIVE
Influenza B by PCR: NEGATIVE
Resp Syncytial Virus by PCR: NEGATIVE
SARS Coronavirus 2 by RT PCR: NEGATIVE

## 2023-02-26 LAB — ECHOCARDIOGRAM COMPLETE
Height: 62 in
S' Lateral: 5.5 cm
Weight: 4310.43 oz

## 2023-02-26 LAB — BRAIN NATRIURETIC PEPTIDE: B Natriuretic Peptide: 919.3 pg/mL — ABNORMAL HIGH (ref 0.0–100.0)

## 2023-02-26 MED ORDER — ACETAMINOPHEN 650 MG RE SUPP: 650.0000 mg | Freq: Four times a day (QID) | RECTAL | Status: DC | PRN

## 2023-02-26 MED ORDER — IPRATROPIUM BROMIDE 0.02 % IN SOLN
0.5000 mg | Freq: Four times a day (QID) | RESPIRATORY_TRACT | Status: DC | PRN
  Administered 2023-02-26 – 2023-03-02 (×2): 0.5 mg via RESPIRATORY_TRACT
  Filled 2023-02-26 (×2): qty 2.5

## 2023-02-26 MED ORDER — ACETAMINOPHEN 325 MG PO TABS
650.0000 mg | ORAL_TABLET | Freq: Four times a day (QID) | ORAL | Status: DC | PRN
  Administered 2023-03-02 (×3): 650 mg via ORAL
  Filled 2023-02-26 (×5): qty 2

## 2023-02-26 MED ORDER — ALBUTEROL SULFATE (2.5 MG/3ML) 0.083% IN NEBU
5.0000 mg | INHALATION_SOLUTION | Freq: Once | RESPIRATORY_TRACT | Status: AC
  Administered 2023-02-26: 5 mg via RESPIRATORY_TRACT
  Filled 2023-02-26: qty 6

## 2023-02-26 MED ORDER — ENOXAPARIN SODIUM 40 MG/0.4ML IJ SOSY
40.0000 mg | PREFILLED_SYRINGE | INTRAMUSCULAR | Status: DC
  Administered 2023-02-27 – 2023-02-28 (×2): 40 mg via SUBCUTANEOUS
  Filled 2023-02-26 (×2): qty 0.4

## 2023-02-26 MED ORDER — FUROSEMIDE 10 MG/ML IJ SOLN
40.0000 mg | Freq: Every day | INTRAMUSCULAR | Status: DC
  Administered 2023-02-26 – 2023-02-27 (×2): 40 mg via INTRAVENOUS
  Filled 2023-02-26 (×2): qty 4

## 2023-02-26 MED ORDER — GUAIFENESIN-DM 100-10 MG/5ML PO SYRP
5.0000 mL | ORAL_SOLUTION | ORAL | Status: DC | PRN
  Administered 2023-02-26: 5 mL via ORAL
  Filled 2023-02-26 (×2): qty 5

## 2023-02-26 MED ORDER — MONTELUKAST SODIUM 10 MG PO TABS
10.0000 mg | ORAL_TABLET | Freq: Every day | ORAL | Status: DC
  Administered 2023-02-26 – 2023-03-03 (×6): 10 mg via ORAL
  Filled 2023-02-26 (×5): qty 1

## 2023-02-26 MED ORDER — PERFLUTREN LIPID MICROSPHERE
1.0000 mL | INTRAVENOUS | Status: AC | PRN
  Administered 2023-02-26: 3 mL via INTRAVENOUS

## 2023-02-26 MED ORDER — PREDNISONE 50 MG PO TABS
60.0000 mg | ORAL_TABLET | Freq: Every day | ORAL | Status: DC
  Administered 2023-02-26 – 2023-03-03 (×6): 60 mg via ORAL
  Filled 2023-02-26 (×4): qty 1
  Filled 2023-02-26: qty 3
  Filled 2023-02-26: qty 1

## 2023-02-26 MED ORDER — LEVALBUTEROL HCL 0.63 MG/3ML IN NEBU
0.6300 mg | INHALATION_SOLUTION | Freq: Four times a day (QID) | RESPIRATORY_TRACT | Status: DC | PRN
  Administered 2023-02-26 – 2023-03-02 (×3): 0.63 mg via RESPIRATORY_TRACT
  Filled 2023-02-26 (×3): qty 3

## 2023-02-26 MED ORDER — LEVALBUTEROL HCL 0.63 MG/3ML IN NEBU
0.6300 mg | INHALATION_SOLUTION | Freq: Two times a day (BID) | RESPIRATORY_TRACT | Status: DC
  Administered 2023-02-27 – 2023-03-02 (×8): 0.63 mg via RESPIRATORY_TRACT
  Filled 2023-02-26 (×8): qty 3

## 2023-02-26 MED ORDER — IPRATROPIUM BROMIDE 0.02 % IN SOLN
0.5000 mg | Freq: Two times a day (BID) | RESPIRATORY_TRACT | Status: DC
  Administered 2023-02-27 – 2023-03-02 (×8): 0.5 mg via RESPIRATORY_TRACT
  Filled 2023-02-26 (×8): qty 2.5

## 2023-02-26 MED ORDER — FUROSEMIDE 10 MG/ML IJ SOLN
40.0000 mg | Freq: Once | INTRAMUSCULAR | Status: AC
  Administered 2023-02-26: 40 mg via INTRAVENOUS
  Filled 2023-02-26: qty 4

## 2023-02-26 MED ORDER — PREDNISONE 20 MG PO TABS
60.0000 mg | ORAL_TABLET | Freq: Once | ORAL | Status: AC
  Administered 2023-02-26: 60 mg via ORAL
  Filled 2023-02-26: qty 3

## 2023-02-26 MED ORDER — IPRATROPIUM BROMIDE 0.02 % IN SOLN
0.5000 mg | Freq: Once | RESPIRATORY_TRACT | Status: AC
  Administered 2023-02-26: 0.5 mg via RESPIRATORY_TRACT
  Filled 2023-02-26: qty 2.5

## 2023-02-26 NOTE — ED Triage Notes (Signed)
Patient reports worsening asthma this week with wheezing and productive cough unrelieved by rescue inhaler at home .

## 2023-02-26 NOTE — H&P (Signed)
History and Physical    MERDIS SNODGRASS IHK:[id number] DOB: 05/01/1976 DOA: 02/26/2023  PCP: System, Provider Not In  Patient coming from: Home  Chief Complaint: Shortness of breath  HPI: Carolyn Oconnor is a 47 y.o. female with medical history significant of asthma, allergic rhinitis, chronic bronchitis, obesity presented to ED with complaints of shortness of breath, cough, wheezing, and bilateral lower extremity edema.  Tachycardic and tachypneic to the ED.  Afebrile and not hypoxemic.  Labs showing no leukocytosis, BNP 919, troponin negative, COVID/influenza/RSV PCR negative.  Chest x-ray showing cardiomegaly and central vascular congestion without significant edema; no focal infiltrate.  EKG without acute ischemic changes.  Patient received albuterol neb, IV Lasix 40 mg, ipratropium neb, and oral prednisone 60 mg.  Patient is endorsing dyspnea, wheezing, orthopnea, bilateral lower extremity edema, and weight gain.  States her symptoms started earlier this month and she was seen at an urgent care center where they prescribed her albuterol inhaler, steroid, and Singulair.  She finished her steroid taper a week ago but symptoms did not improve.  She is also using albuterol twice daily.  Patient states when she went to urgent care she weighed a lot more than what she normally does even though her diet has not changed.  She denies fevers or chest pain.  Denies history of CHF or heart problems.  No other complaints.  Review of Systems:  Review of Systems  All other systems reviewed and are negative.   Past Medical History:  Diagnosis Date   Allergic rhinitis    Asthma    Bronchitis, chronic (HCC)    Pneumonia     Past Surgical History:  Procedure Laterality Date   none       reports that she is a non-smoker but has been exposed to tobacco smoke. She has never used smokeless tobacco. She reports that she does not drink alcohol and does not use drugs.  Allergies  Allergen Reactions    Aspirin     Patient states that it makes her go into an asthma attack.     Family History  Problem Relation Age of Onset   Allergies Mother    Heart disease Mother    Allergies Father    Asthma Father    Cancer Sister    Asthma Sister    Allergies Sister    Heart disease Maternal Aunt    Breast cancer Maternal Aunt    Asthma Brother    Allergies Brother     Prior to Admission medications   Medication Sig Start Date End Date Taking? Authorizing Provider  albuterol (PROVENTIL HFA;VENTOLIN HFA) 108 (90 Base) MCG/ACT inhaler Inhale 2 puffs into the lungs every 4 (four) hours as needed (cough and wheeze). For asthma attacks. 03/09/16   Roslynn Amble, MD  budesonide-formoterol [facility]) 80-4.5 MCG/ACT inhaler Inhale 2 puffs into the lungs 2 (two) times daily. Patient taking differently: Inhale 2 puffs into the lungs 2 (two) times daily as needed (shortness of breath).  03/09/16   Roslynn Amble, MD  fluticasone (FLONASE) 50 MCG/ACT nasal spray Place 2 sprays into both nostrils daily. Patient taking differently: Place 2 sprays into both nostrils daily as needed for allergies.  01/22/14   Rodolph Bong, MD  ibuprofen (ADVIL,MOTRIN) 200 MG tablet Take 800 mg by mouth every 6 (six) hours as needed for moderate pain.     [provider]  montelukast (SINGULAIR) 10 MG tablet Take 1 tablet (10 mg total) by mouth at bedtime.  03/09/16   Roslynn Amble, MD    Physical Exam: Vitals:   02/26/23 0105 02/26/23 0115 02/26/23 0130 02/26/23 0215  BP: (!) 143/98 (!) 126/95 (!) 130/97 (!) 126/90  Pulse: (!) 117 (!) 110 (!) 108 (!) 112  Resp: (!) 26 (!) 24 (!) 22 (!) 23  Temp: 98.1 F (36.7 C)     SpO2: 96% 97% 97% 98%    Physical Exam Vitals reviewed.  Constitutional:      General: She is not in acute distress. HENT:     Head: Normocephalic and atraumatic.  Eyes:     Extraocular Movements: Extraocular movements intact.  Cardiovascular:     Rate and Rhythm: Regular rhythm.  Tachycardia present.     Pulses: Normal pulses.  Pulmonary:     Effort: Pulmonary effort is normal. No respiratory distress.     Breath sounds: Rales present. No wheezing.  Abdominal:     General: Bowel sounds are normal. There is no distension.     Palpations: Abdomen is soft.     Tenderness: There is no abdominal tenderness.  Musculoskeletal:     Cervical back: Normal range of motion.     Right lower leg: Edema present.     Left lower leg: Edema present.     Comments: 3+ pitting edema of bilateral lower extremities  Skin:    General: Skin is warm and dry.  Neurological:     General: No focal deficit present.     Mental Status: She is alert and oriented to person, place, and time.     Labs on Admission: I have personally reviewed following labs and imaging studies  CBC: Recent Labs  Lab 02/26/23 0106  WBC 9.0  HGB 13.4  HCT 40.1  MCV 85.3  PLT 277   Basic Metabolic Panel: Recent Labs  Lab 02/26/23 0106  NA 139  K 4.2  CL 107  CO2 20*  GLUCOSE 109*  BUN 18  CREATININE 0.94  CALCIUM 9.1   GFR: CrCl cannot be calculated (Unknown ideal weight.). Liver Function Tests: No results for input(s): "AST", "ALT", "ALKPHOS", "BILITOT", "PROT", "ALBUMIN" in the last 168 hours. No results for input(s): "LIPASE", "AMYLASE" in the last 168 hours. No results for input(s): "AMMONIA" in the last 168 hours. Coagulation Profile: No results for input(s): "INR", "PROTIME" in the last 168 hours. Cardiac Enzymes: No results for input(s): "CKTOTAL", "CKMB", "CKMBINDEX", "TROPONINI" in the last 168 hours. BNP (last 3 results) No results for input(s): "PROBNP" in the last 8760 hours. HbA1C: No results for input(s): "HGBA1C" in the last 72 hours. CBG: No results for input(s): "GLUCAP" in the last 168 hours. Lipid Profile: No results for input(s): "CHOL", "HDL", "LDLCALC", "TRIG", "CHOLHDL", "LDLDIRECT" in the last 72 hours. Thyroid Function Tests: No results for input(s): "TSH",  "T4TOTAL", "FREET4", "T3FREE", "THYROIDAB" in the last 72 hours. Anemia Panel: No results for input(s): "VITAMINB12", "FOLATE", "FERRITIN", "TIBC", "IRON", "RETICCTPCT" in the last 72 hours. Urine analysis:    Component Value Date/Time   COLORURINE YELLOW 02/01/2010 2200   APPEARANCEUR CLEAR 02/01/2010 2200   LABSPEC 1.024 02/01/2010 2200   PHURINE 6.5 02/01/2010 2200   GLUCOSEU NEGATIVE 02/01/2010 2200   HGBUR NEGATIVE 02/01/2010 2200   BILIRUBINUR NEGATIVE 02/01/2010 2200   KETONESUR 15 (A) 02/01/2010 2200   PROTEINUR NEGATIVE 02/01/2010 2200   UROBILINOGEN 0.2 02/01/2010 2200   NITRITE NEGATIVE 02/01/2010 2200   LEUKOCYTESUR  02/01/2010 2200    NEGATIVE MICROSCOPIC NOT DONE ON URINES WITH NEGATIVE PROTEIN, BLOOD,  LEUKOCYTES, NITRITE, OR GLUCOSE <1000 mg/dL.    Radiological Exams on Admission: DG Chest Port 1 View  Result Date: 02/26/2023 CLINICAL DATA:  Shortness of breath EXAM: PORTABLE CHEST 1 VIEW COMPARISON:  07/04/2016 FINDINGS: Cardiac shadow is enlarged. Central vascular congestion is noted without significant edema. No focal infiltrate is noted. No bony abnormality is seen. IMPRESSION: Changes consistent with CHF. Electronically Signed   By: Alcide Clever M.D.   On: 02/26/2023 01:21    EKG: Independently reviewed.  Sinus tachycardia, mild T wave abnormality inferolaterally.  No significant change compared to prior tracing.  Assessment and Plan  Acute CHF Patient with no documented history of CHF or prior echo results in the chart presenting with 1 month history of progressively worsening dyspnea, orthopnea, bilateral lower extremity edema, and weight gain.  She appears volume overloaded on exam with rales and significant bilateral lower extremity edema.  BNP 919. Chest x-ray showing cardiomegaly and central vascular congestion without significant edema.  She is not hypoxic.  Patient was given IV Lasix 40 mg in the ED.  Continue diuresis with IV Lasix 40 mg daily starting in  the morning.  Echocardiogram ordered.  Monitor intake and output, daily weights.  Low-sodium diet with fluid restriction.  Acute asthma exacerbation Patient was wheezing on initial assessment done by ED physician and was given oral prednisone 60 mg and bronchodilators.  She is no longer wheezing.  Continue prednisone 60 mg daily, levalbuterol-ipratropium nebs every 6 hours as needed, and Singulair.  Continuous pulse ox, supplemental oxygen as needed.  DVT prophylaxis: Lovenox Code Status: Full Code (discussed with the patient) Family Communication: No family available at this time. Level of care: Telemetry bed Admission status: It is my clinical opinion that admission to INPATIENT is reasonable and necessary because of the expectation that this patient will require hospital care that crosses at least 2 midnights to treat this condition based on the medical complexity of the problems presented.  Given the aforementioned information, the predictability of an adverse outcome is felt to be significant.   John Giovanni MD [facility]  If 7PM-7AM, please contact night-coverage [email or web address]  02/26/2023, 3:13 AM

## 2023-02-26 NOTE — ED Notes (Signed)
Patient A&Ox4, no s/s of any distress. Patient up eating breakfast at this time. Will continue to monitor

## 2023-02-26 NOTE — Plan of Care (Signed)
?  Problem: Education: ?Goal: Ability to demonstrate management of disease process will improve ?Outcome: Progressing ?Goal: Individualized Educational Video(s) ?Outcome: Progressing ?  ?Problem: Activity: ?Goal: Capacity to carry out activities will improve ?Outcome: Progressing ?  ?Problem: Cardiac: ?Goal: Ability to achieve and maintain adequate cardiopulmonary perfusion will improve ?Outcome: Progressing ?  ?Problem: Education: ?Goal: Knowledge of General Education information will improve ?Description: Including pain rating scale, medication(s)/side effects and non-pharmacologic comfort measures ?Outcome: Progressing ?  ?

## 2023-02-26 NOTE — ED Notes (Signed)
Pt arrived with audible wheezes and sob. Pt reports she should not have gone to work tonight as she has been having problems with her asthma x 2 weeks and it is just getting worse. Pt a/o x 4 respirations even and non labored audible insp/exp wheezes noted lungs sound diminished.

## 2023-02-26 NOTE — ED Notes (Signed)
ED TO INPATIENT HANDOFF REPORT  ED Nurse Name and Phone #:   S Name/Age/Gender Carolyn Oconnor 47 y.o. female Room/Bed: 038C/038C  Code Status   Code Status: Full Code  Home/SNF/Other Home Patient oriented to: self, place, time, and situation Is this baseline? Yes   Triage Complete: Triage complete  Chief Complaint Acute CHF (congestive heart failure) (HCC) [I50.9]  Triage Note Patient reports worsening asthma this week with wheezing and productive cough unrelieved by rescue inhaler at home .    Allergies Allergies  Allergen Reactions   Aspirin     Patient states that it makes her go into an asthma attack.     Level of Care/Admitting Diagnosis ED Disposition     ED Disposition  Admit   Condition  --   Comment  Hospital Area: [facility] [[id number]]  Level of Care: Telemetry Cardiac [[id number]]  May admit patient to [facility] or [facility] if equivalent level of care is available:: Yes  Covid Evaluation: Asymptomatic - no recent exposure (last 10 days) testing not required  Diagnosis: Acute CHF (congestive heart failure) [facility]) [[id number]]  Admitting Physician: John Giovanni [[id number]]  Attending Physician: John Giovanni [[id number]]  Certification:: I certify this patient will need inpatient services for at least 2 midnights  Estimated Length of Stay: 2          B Medical/Surgery History Past Medical History:  Diagnosis Date   Allergic rhinitis    Asthma    Bronchitis, chronic (HCC)    Pneumonia    Past Surgical History:  Procedure Laterality Date   none       A IV Location/Drains/Wounds Patient Lines/Drains/Airways Status     Active Line/Drains/Airways     Name Placement date Placement time Site Days   Peripheral IV 02/26/23 22 G Anterior;Distal;Left;Upper Arm 02/26/23  0200  Arm  less than 1            Intake/Output Last 24 hours  Intake/Output Summary (Last 24 hours) at 02/26/2023 1022 Last data filed at 02/26/2023  0428 Gross per 24 hour  Intake --  Output 2700 ml  Net -2700 ml    Labs/Imaging Results for orders placed or performed during the hospital encounter of 02/26/23 (from the past 48 hour(s))  CBC     Status: None   Collection Time: 02/26/23  1:06 AM  Result Value Ref Range   WBC 9.0 4.0 - 10.5 K/uL   RBC 4.70 3.87 - 5.11 MIL/uL   Hemoglobin 13.4 12.0 - 15.0 g/dL   HCT 29.5 62.1 - 30.8 %   MCV 85.3 80.0 - 100.0 fL   MCH 28.5 26.0 - 34.0 pg   MCHC 33.4 30.0 - 36.0 g/dL   RDW 65.7 84.6 - 96.2 %   Platelets 277 150 - 400 K/uL   nRBC 0.0 0.0 - 0.2 %    Comment: Performed at [facility], [address], [place], [place] [address]  Basic metabolic panel     Status: Abnormal   Collection Time: 02/26/23  1:06 AM  Result Value Ref Range   Sodium 139 135 - 145 mmol/L   Potassium 4.2 3.5 - 5.1 mmol/L   Chloride 107 98 - 111 mmol/L   CO2 20 (L) 22 - 32 mmol/L   Glucose, Bld 109 (H) 70 - 99 mg/dL    Comment: Glucose reference range applies only to samples taken after fasting for at least 8 hours.   BUN 18 6 - 20 mg/dL  Creatinine, Ser 0.94 0.44 - 1.00 mg/dL   Calcium 9.1 8.9 - 16.1 mg/dL   GFR, Estimated >09 >60 mL/min    Comment: (NOTE) Calculated using the CKD-EPI Creatinine Equation (2021)    Anion gap 12 5 - 15    Comment: Performed at [facility], [address], [place], [place] [address]  Brain natriuretic peptide     Status: Abnormal   Collection Time: 02/26/23  1:06 AM  Result Value Ref Range   B Natriuretic Peptide 919.3 (H) 0.0 - 100.0 pg/mL    Comment: Performed at [facility], [address], [place], [place] [address]  Troponin I (High Sensitivity)     Status: None   Collection Time: 02/26/23  1:06 AM  Result Value Ref Range   Troponin I (High Sensitivity) 16 <18 ng/L    Comment: (NOTE) Elevated high sensitivity troponin I (hsTnI) values and significant  changes across serial measurements may suggest ACS but many other  chronic and acute  conditions are known to elevate hsTnI results.  Refer to the "Links" section for chest pain algorithms and additional  guidance. Performed at [facility], [address] [place], [place] [address]   Resp panel by RT-PCR (RSV, Flu A&B, Covid) Anterior Nasal Swab     Status: None   Collection Time: 02/26/23  1:15 AM   Specimen: Anterior Nasal Swab  Result Value Ref Range   SARS Coronavirus 2 by RT PCR NEGATIVE NEGATIVE   Influenza A by PCR NEGATIVE NEGATIVE   Influenza B by PCR NEGATIVE NEGATIVE    Comment: (NOTE) The Xpert Xpress SARS-CoV-2/FLU/RSV plus assay is intended as an aid in the diagnosis of influenza from Nasopharyngeal swab specimens and should not be used as a sole basis for treatment. Nasal washings and aspirates are unacceptable for Xpert Xpress SARS-CoV-2/FLU/RSV testing.  Fact Sheet for Patients: BloggerCourse.com  Fact Sheet for Healthcare Providers: SeriousBroker.it  This test is not yet approved or cleared by the [place] FDA and has been authorized for detection and/or diagnosis of SARS-CoV-2 by FDA under an Emergency Use Authorization (EUA). This EUA will remain in effect (meaning this test can be used) for the duration of the COVID-19 declaration under Section 564(b)(1) of the Act, 21 U.S.C. section 360bbb-3(b)(1), unless the authorization is terminated or revoked.     Resp Syncytial Virus by PCR NEGATIVE NEGATIVE    Comment: (NOTE) Fact Sheet for Patients: BloggerCourse.com  Fact Sheet for Healthcare Providers: SeriousBroker.it  This test is not yet approved or cleared by the [place] FDA and has been authorized for detection and/or diagnosis of SARS-CoV-2 by FDA under an Emergency Use Authorization (EUA). This EUA will remain in effect (meaning this test can be used) for the duration of the COVID-19 declaration under Section  564(b)(1) of the Act, 21 U.S.C. section 360bbb-3(b)(1), unless the authorization is terminated or revoked.  Performed at [facility], [address] [place], [place] [address]   Troponin I (High Sensitivity)     Status: None   Collection Time: 02/26/23  4:03 AM  Result Value Ref Range   Troponin I (High Sensitivity) 16 <18 ng/L    Comment: (NOTE) Elevated high sensitivity troponin I (hsTnI) values and significant  changes across serial measurements may suggest ACS but many other  chronic and acute conditions are known to elevate hsTnI results.  Refer to the "Links" section for chest pain algorithms and additional  guidance. Performed at [facility]  [facility], [address] [place], [place] [address]    DG Chest Port 1 View  Result Date: 02/26/2023 CLINICAL DATA:  Shortness of breath EXAM: PORTABLE CHEST 1 VIEW COMPARISON:  07/04/2016 FINDINGS: Cardiac shadow is enlarged. Central vascular congestion is noted without significant edema. No focal infiltrate is noted. No bony abnormality is seen. IMPRESSION: Changes consistent with CHF. Electronically Signed   By: Alcide Clever M.D.   On: 02/26/2023 01:21    Pending Labs Unresulted Labs (From admission, onward)     Start     Ordered   02/27/23 0500  CBC with Differential/Platelet  Tomorrow morning,   R        02/26/23 0900   02/27/23 0500  Comprehensive metabolic panel  Tomorrow morning,   R        02/26/23 0900   02/27/23 0500  Brain natriuretic peptide  Tomorrow morning,   R        02/26/23 0901   02/26/23 0500  HIV Antibody (routine testing w rflx)  Once,   R        02/26/23 0500            Vitals/Pain Today's Vitals   02/26/23 0300 02/26/23 0400 02/26/23 0446 02/26/23 0628  BP:    120/80  Pulse: (!) 122 (!) 108  (!) 103  Resp: 14 20  20   Temp:   98 F (36.7 C) 98 F (36.7 C)  TempSrc:   Oral Oral  SpO2: 98% 98%  98%  PainSc:        Isolation Precautions No active isolations  Medications Medications   montelukast (SINGULAIR) tablet 10 mg (has no administration in time range)  ipratropium (ATROVENT) nebulizer solution 0.5 mg (has no administration in time range)  levalbuterol (XOPENEX) nebulizer solution 0.63 mg (has no administration in time range)  predniSONE (DELTASONE) tablet 60 mg (60 mg Oral Given 02/26/23 0917)  enoxaparin (LOVENOX) injection 40 mg (has no administration in time range)  acetaminophen (TYLENOL) tablet 650 mg (has no administration in time range)    Or  acetaminophen (TYLENOL) suppository 650 mg (has no administration in time range)  furosemide (LASIX) injection 40 mg (40 mg Intravenous Given 02/26/23 0918)  albuterol (PROVENTIL) (2.5 MG/3ML) 0.083% nebulizer solution 5 mg (5 mg Nebulization Given 02/26/23 0110)  ipratropium (ATROVENT) nebulizer solution 0.5 mg (0.5 mg Nebulization Given 02/26/23 0110)  predniSONE (DELTASONE) tablet 60 mg (60 mg Oral Given 02/26/23 0108)  furosemide (LASIX) injection 40 mg (40 mg Intravenous Given 02/26/23 0206)    Mobility walks     Focused Assessments Cardiac Assessment Handoff:    Lab Results  Component Value Date   CKTOTAL 161 10/18/2008   CKMB 1.4 10/18/2008   Lab Results  Component Value Date   DDIMER 0.43 07/04/2016   Does the Patient currently have chest pain? No    R Recommendations: See Admitting Provider Note  Report given to:   Additional Notes:

## 2023-02-26 NOTE — ED Notes (Signed)
After neb tx pt wheezes have resolved but she continues with sob. Pt also noted to have bilateral lower extremity edema 2+ non pitting. She reports she was sick in early April along with all her coworkers and that she began noticing swelling in her feet but didn't think anything of it.

## 2023-02-26 NOTE — ED Notes (Signed)
Hospitalist at bedside 

## 2023-02-26 NOTE — ED Provider Notes (Signed)
[place] EMERGENCY DEPARTMENT AT [facility] Provider Note   CSN: [id number] Arrival date & time: 02/26/23  0054     History  Chief Complaint  Patient presents with   Asthma   Level 5 caveat due to acuity of condition [name] is a 47 y.o. female.  The history is provided by the patient.  Patient history of asthma and obesity presents with increasing shortness of breath.  Patient reports symptoms for several weeks, but is worse over the past week.  She reports going to urgent care and having negative viral testing and given albuterol without any improvement.  She reports mostly nonproductive cough.  She reports increasing chest tightness and wheezing.  She also reports lower extremity edema.  She does not have a primary doctor She is a non-smoker Denies previous history of intubation   Past Medical History:  Diagnosis Date   Allergic rhinitis    Asthma    Bronchitis, chronic (HCC)    Pneumonia     Home Medications Prior to Admission medications   Medication Sig Start Date End Date Taking? Authorizing Provider  albuterol (PROVENTIL HFA;VENTOLIN HFA) 108 (90 Base) MCG/ACT inhaler Inhale 2 puffs into the lungs every 4 (four) hours as needed (cough and wheeze). For asthma attacks. 03/09/16   Roslynn Amble, MD  budesonide-formoterol [facility]) 80-4.5 MCG/ACT inhaler Inhale 2 puffs into the lungs 2 (two) times daily. Patient taking differently: Inhale 2 puffs into the lungs 2 (two) times daily as needed (shortness of breath).  03/09/16   Roslynn Amble, MD  fluticasone (FLONASE) 50 MCG/ACT nasal spray Place 2 sprays into both nostrils daily. Patient taking differently: Place 2 sprays into both nostrils daily as needed for allergies.  01/22/14   Rodolph Bong, MD  ibuprofen (ADVIL,MOTRIN) 200 MG tablet Take 800 mg by mouth every 6 (six) hours as needed for moderate pain.     [provider]  montelukast (SINGULAIR) 10 MG tablet Take 1 tablet (10 mg  total) by mouth at bedtime. 03/09/16   Roslynn Amble, MD      Allergies    Aspirin    Review of Systems   Review of Systems  Unable to perform ROS: Acuity of condition    Physical Exam Updated Vital Signs BP (!) 126/90   Pulse (!) 112   Temp 98.1 F (36.7 C)   Resp (!) 23   SpO2 98%  Physical Exam CONSTITUTIONAL: Well developed/well nourished, distress noted HEAD: Normocephalic/atraumatic EYES: EOMI/PERRL ENMT: Mucous membranes moist NECK: supple no meningeal signs CV: S1/S2 noted, tachycardic LUNGS: Tachypnea, wheezing bilaterally ABDOMEN: soft, obese NEURO: Pt is awake/alert/appropriate, moves all extremitiesx4.  No facial droop.   EXTREMITIES: pulses normal/equal, full ROM, symmetric edema to bilateral lower extremities SKIN: warm, color normal PSYCH: Anxious  ED Results / Procedures / Treatments   Labs (all labs ordered are listed, but only abnormal results are displayed) Labs Reviewed  BASIC METABOLIC PANEL - Abnormal; Notable for the following components:      Result Value   CO2 20 (*)    Glucose, Bld 109 (*)    All other components within normal limits  BRAIN NATRIURETIC PEPTIDE - Abnormal; Notable for the following components:   B Natriuretic Peptide 919.3 (*)    All other components within normal limits  RESP PANEL BY RT-PCR (RSV, FLU A&B, COVID)  [id number]  CBC  TROPONIN I (HIGH SENSITIVITY)  TROPONIN I (HIGH SENSITIVITY)    EKG EKG Interpretation  Date/Time:  Tuesday February 26 2023 01:11:29 EDT Ventricular Rate:  113 PR Interval:  133 QRS Duration: 98 QT Interval:  343 QTC Calculation: 471 R Axis:   121 Text Interpretation: Sinus tachycardia Lateral infarct, age indeterminate Anteroseptal infarct, old Interpretation limited secondary to artifact Confirmed by Zadie Rhine ([id number]) on 02/26/2023 1:15:37 AM  Radiology DG Chest Port 1 View  Result Date: 02/26/2023 CLINICAL DATA:  Shortness of breath EXAM: PORTABLE CHEST 1 VIEW COMPARISON:   07/04/2016 FINDINGS: Cardiac shadow is enlarged. Central vascular congestion is noted without significant edema. No focal infiltrate is noted. No bony abnormality is seen. IMPRESSION: Changes consistent with CHF. Electronically Signed   By: Alcide Clever M.D.   On: 02/26/2023 01:21    Procedures .Critical Care  Performed by: Zadie Rhine, MD Authorized by: Zadie Rhine, MD   Critical care provider statement:    Critical care time (minutes):  45   Critical care start time:  02/26/2023 1:15 AM   Critical care end time:  02/26/2023 2:00 AM   Critical care time was exclusive of:  Separately billable procedures and treating other patients   Critical care was necessary to treat or prevent imminent or life-threatening deterioration of the following conditions:  Respiratory failure   Critical care was time spent personally by me on the following activities:  Re-evaluation of patient's condition, pulse oximetry, ordering and review of radiographic studies, ordering and review of laboratory studies, examination of patient, evaluation of patient's response to treatment, development of treatment plan with patient or surrogate and obtaining history from patient or surrogate   I assumed direction of critical care for this patient from another provider in my specialty: no       Medications Ordered in ED Medications  albuterol (PROVENTIL) (2.5 MG/3ML) 0.083% nebulizer solution 5 mg (5 mg Nebulization Given 02/26/23 0110)  ipratropium (ATROVENT) nebulizer solution 0.5 mg (0.5 mg Nebulization Given 02/26/23 0110)  predniSONE (DELTASONE) tablet 60 mg (60 mg Oral Given 02/26/23 0108)  furosemide (LASIX) injection 40 mg (40 mg Intravenous Given 02/26/23 0206)    ED Course/ Medical Decision Making/ A&P Clinical Course as of 02/26/23 0329  Tue Feb 26, 2023  0116 Patient with known history of asthma presents with increasing shortness of breath.  Reports being seen in urgent cares without any improvement.   Reports lower extremity edema.  Denies any recent labs or x-rays.  Will perform x-ray, labs and reassess [DW]  0226 Patient reports recent dyspnea on exertion and also lower extremity edema.  X-ray consistent with likely CHF.  Patient is not known to have this as a diagnosis.  She reports she has not been feeling well for at least a month.  Labs pending at this time.  Will give Lasix.  Patient is tachypneic and tachycardic, but does not require respiratory support   [DW]  0329 Discussed with Dr. Loney Loh for admission [DW]    Clinical Course User Index [DW] Zadie Rhine, MD                             Medical Decision Making Amount and/or Complexity of Data Reviewed Labs: ordered. Radiology: ordered.  Risk Prescription drug management. Decision regarding hospitalization.   This patient presents to the ED for concern of shortness of breath, this involves an extensive number of treatment options, and is a complaint that carries with it a high risk of complications and morbidity.  The differential diagnosis includes but is not limited to  Acute coronary syndrome, pneumonia, acute pulmonary edema, pneumothorax, acute anemia, pulmonary embolism, status asthmaticus   Comorbidities that complicate the patient evaluation: Patient's presentation is complicated by their history of asthma and obesity  Social Determinants of Health: Patient's impaired access to primary care  increases the complexity of managing their presentation  Additional history obtained: Records reviewed  pulmonology notes reviewed  Lab Tests: I Ordered, and personally interpreted labs.  The pertinent results include:  elevated BNP c/w CHF  Imaging Studies ordered: I ordered imaging studies including X-ray chest   I independently visualized and interpreted imaging which showed CHF I agree with the radiologist interpretation  Cardiac Monitoring: The patient was maintained on a cardiac monitor.  I personally viewed  and interpreted the cardiac monitor which showed an underlying rhythm of:  sinus tachycardia  Medicines ordered and prescription drug management: I ordered medication including albuterol, Atrovent, prednisone for wheezing Reevaluation of the patient after these medicines showed that the patient    stayed the same  Critical Interventions:  lasix for CHF  Consultations Obtained: I requested consultation with the admitting physician dr Loney Loh , and discussed  findings as well as pertinent plan - they recommend: admission for new onset CHF  Reevaluation: After the interventions noted above, I reevaluated the patient and found that they have :improved  Complexity of problems addressed: Patient's presentation is most consistent with  acute presentation with potential threat to life or bodily function  Disposition: After consideration of the diagnostic results and the patient's response to treatment,  I feel that the patent would benefit from admission   .           Final Clinical Impression(s) / ED Diagnoses Final diagnoses:  Acute systolic congestive heart failure [facility])    Rx / DC Orders ED Discharge Orders     None         Zadie Rhine, MD 02/26/23 0330

## 2023-02-26 NOTE — Progress Notes (Signed)
  Transition of Care [facility]) Screening Note   Patient Details  Name: Carolyn Oconnor Date of Birth: [birth year]   Transition of Care [facility]) CM/SW Contact:    Leone Haven, RN Phone Number: 02/26/2023, 12:37 PM    Transition of Care Department [facility]) has reviewed patient , has no PCP on file or insurance.  We will continue to monitor patient advancement through interdisciplinary progression rounds. If new patient transition needs arise, please place a TOC consult.

## 2023-02-26 NOTE — Progress Notes (Signed)
Progress Note   Patient: Carolyn Oconnor OZD:[id number] DOB: 1976-02-08 DOA: 02/26/2023     0 DOS: the patient was seen and examined on 02/26/2023   Brief hospital course: Carolyn Oconnor is a 47 y.o. female with medical history significant of asthma, allergic rhinitis, chronic bronchitis, obesity presented to ED with complaints of shortness of breath, cough, wheezing, and bilateral lower extremity edema.  Patient got admitted for suspected CHF exacerbation, asthma exacerbation.  He has been initiated on diuresis with breathing treatments patient is reporting some improvement.  Although she is on room air patient still has conversational dyspnea and tachycardia even at rest.  Assessment and Plan: Acute CHF Patient with no documented history of CHF or prior echo results in the chart presenting with 1 month history of progressively worsening dyspnea, orthopnea, bilateral lower extremity edema, and weight gain.   -Chest x-ray showing central vascular congestion, BNP 919 - Continue IV diuresis with Lasix, cumulative net fluid is-35000 - Echocardiogram ordered currently pending - Fluid and salt restriction to be continued. - Patient still clearly is symptomatic with conversational dyspnea although she is not oxygen dependent and is on room air.     Acute asthma exacerbation -Overall wheezing has improved, however still has clearly conversational dyspnea and tachycardia - Continue prednisone 60, DuoNebs every 6 hours and Singulair - Currently patient is on room air .-Continuous pulse ox, supplemental oxygen as needed.   DVT prophylaxis: Lovenox Code Status: Full Code (discussed with the patient) Family Communication: No family available at this time.      Subjective: Patient was seen and examined at bedside today.  Patient reports improvement in shortness of breath compared to her admission.  Denies having any chest pain palpitations or fevers.  Physical Exam: Vitals:   02/26/23 0628  02/26/23 1045 02/26/23 1057 02/26/23 1100  BP: 120/80 129/89  120/64  Pulse: (!) 103 (!) 126  (!) 108  Resp: 20 18    Temp: 98 F (36.7 C)  97.8 F (36.6 C)   TempSrc: Oral     SpO2: 98% 99%  97%   Constitutional:      General: She is not in acute distress. HENT:     Head: Normocephalic and atraumatic.  Eyes:     Extraocular Movements: Extraocular movements intact.  Cardiovascular:     Rate and Rhythm: Regular rhythm. Tachycardia present.     Pulses: Normal pulses.  Pulmonary:     Effort: Pulmonary effort is normal. No respiratory distress.     Breath sounds: Fine crepitations, intermittent rhonchi appreciated Abdominal:     General: Bowel sounds are normal. There is no distension.     Palpations: Abdomen is soft.     Tenderness: There is no abdominal tenderness.  Musculoskeletal:     Cervical back: Normal range of motion.     Right lower leg: Edema present.     Left lower leg: Edema present.     Comments: 3+ pitting edema of bilateral lower extremities  Skin:    General: Skin is warm and dry.  Neurological:     General: No focal deficit present.     Mental Status: She is alert and oriented to person, place, and time.  Data Reviewed:  There are no new results to review at this time.  Family Communication: None at bedside  Disposition: Status is: Inpatient Remains inpatient appropriate because: Patient is still symptomatic with shortness of breath  Planned Discharge Destination: Home    Time spent: 35 minutes  Author: Harold Hedge, MD 02/26/2023 12:25 PM  For on call review [email or web address].

## 2023-02-27 ENCOUNTER — Encounter (HOSPITAL_COMMUNITY): Payer: Self-pay | Admitting: Internal Medicine

## 2023-02-27 ENCOUNTER — Inpatient Hospital Stay (HOSPITAL_COMMUNITY): Payer: Self-pay

## 2023-02-27 ENCOUNTER — Other Ambulatory Visit (HOSPITAL_COMMUNITY): Payer: Self-pay

## 2023-02-27 ENCOUNTER — Telehealth (HOSPITAL_COMMUNITY): Payer: Self-pay | Admitting: Pharmacy Technician

## 2023-02-27 DIAGNOSIS — D649 Anemia, unspecified: Secondary | ICD-10-CM

## 2023-02-27 DIAGNOSIS — J45901 Unspecified asthma with (acute) exacerbation: Secondary | ICD-10-CM

## 2023-02-27 DIAGNOSIS — I5021 Acute systolic (congestive) heart failure: Secondary | ICD-10-CM

## 2023-02-27 LAB — RAPID URINE DRUG SCREEN, HOSP PERFORMED
Amphetamines: NOT DETECTED
Barbiturates: NOT DETECTED
Benzodiazepines: NOT DETECTED
Cocaine: NOT DETECTED
Opiates: NOT DETECTED
Tetrahydrocannabinol: NOT DETECTED

## 2023-02-27 LAB — CBC WITH DIFFERENTIAL/PLATELET
Abs Immature Granulocytes: 0.05 10*3/uL (ref 0.00–0.07)
Basophils Absolute: 0 10*3/uL (ref 0.0–0.1)
Basophils Relative: 0 %
Eosinophils Absolute: 0 10*3/uL (ref 0.0–0.5)
Eosinophils Relative: 0 %
HCT: 41.4 % (ref 36.0–46.0)
Hemoglobin: 13.7 g/dL (ref 12.0–15.0)
Immature Granulocytes: 1 %
Lymphocytes Relative: 15 %
Lymphs Abs: 1.6 10*3/uL (ref 0.7–4.0)
MCH: 28.1 pg (ref 26.0–34.0)
MCHC: 33.1 g/dL (ref 30.0–36.0)
MCV: 84.8 fL (ref 80.0–100.0)
Monocytes Absolute: 0.6 10*3/uL (ref 0.1–1.0)
Monocytes Relative: 6 %
Neutro Abs: 8.7 10*3/uL — ABNORMAL HIGH (ref 1.7–7.7)
Neutrophils Relative %: 78 %
Platelets: 313 10*3/uL (ref 150–400)
RBC: 4.88 MIL/uL (ref 3.87–5.11)
RDW: 14.7 % (ref 11.5–15.5)
WBC: 11.1 10*3/uL — ABNORMAL HIGH (ref 4.0–10.5)
nRBC: 0 % (ref 0.0–0.2)

## 2023-02-27 LAB — RESPIRATORY PANEL BY PCR

## 2023-02-27 LAB — COMPREHENSIVE METABOLIC PANEL
ALT: 20 U/L (ref 0–44)
AST: 24 U/L (ref 15–41)
Albumin: 3.5 g/dL (ref 3.5–5.0)
Alkaline Phosphatase: 44 U/L (ref 38–126)
Anion gap: 9 (ref 5–15)
BUN: 18 mg/dL (ref 6–20)
CO2: 21 mmol/L — ABNORMAL LOW (ref 22–32)
Calcium: 9.2 mg/dL (ref 8.9–10.3)
Chloride: 106 mmol/L (ref 98–111)
Creatinine, Ser: 0.94 mg/dL (ref 0.44–1.00)
GFR, Estimated: >60 mL/min (ref >60)
Glucose, Bld: 115 mg/dL — ABNORMAL HIGH (ref 70–99)
Potassium: 4.4 mmol/L (ref 3.5–5.1)
Sodium: 136 mmol/L (ref 135–145)
Total Bilirubin: 0.8 mg/dL (ref 0.3–1.2)
Total Protein: 7.3 g/dL (ref 6.5–8.1)

## 2023-02-27 LAB — BRAIN NATRIURETIC PEPTIDE: B Natriuretic Peptide: 868.7 pg/mL — ABNORMAL HIGH (ref 0.0–100.0)

## 2023-02-27 MED ORDER — SPIRONOLACTONE 12.5 MG HALF TABLET
12.5000 mg | ORAL_TABLET | Freq: Every day | ORAL | Status: DC
  Administered 2023-02-27 – 2023-02-28 (×2): 12.5 mg via ORAL
  Filled 2023-02-27 (×2): qty 1

## 2023-02-27 MED ORDER — FUROSEMIDE 10 MG/ML IJ SOLN: 40.0000 mg | Freq: Two times a day (BID) | INTRAMUSCULAR | Status: DC

## 2023-02-27 MED ORDER — LOSARTAN POTASSIUM 25 MG PO TABS
12.5000 mg | ORAL_TABLET | Freq: Every day | ORAL | Status: DC
  Administered 2023-02-27 – 2023-03-04 (×6): 12.5 mg via ORAL
  Filled 2023-02-27 (×6): qty 1

## 2023-02-27 MED ORDER — FUROSEMIDE 10 MG/ML IJ SOLN
80.0000 mg | Freq: Two times a day (BID) | INTRAMUSCULAR | Status: DC
  Administered 2023-02-27 – 2023-02-28 (×3): 80 mg via INTRAVENOUS
  Filled 2023-02-27 (×3): qty 8

## 2023-02-27 MED ORDER — GUAIFENESIN ER 600 MG PO TB12
1200.0000 mg | ORAL_TABLET | Freq: Two times a day (BID) | ORAL | Status: DC
  Administered 2023-02-27 – 2023-03-04 (×11): 1200 mg via ORAL
  Filled 2023-02-27 (×11): qty 2

## 2023-02-27 NOTE — Progress Notes (Signed)
MEWS Progress Note  Patient Details Name: RACINE ERBY MRN: [id number] DOB: April 03, 1976 Today's Date: 02/27/2023   MEWS Flowsheet Documentation:  Assess: MEWS Score Temp: 98.3 F (36.8 C) BP: 108/81 MAP (mmHg): 91 Pulse Rate: 98 ECG Heart Rate: (!) 101 Resp: (!) 25 Level of Consciousness: Alert SpO2: 98 % O2 Device: Room Air (nasal canula reinserted for work of breathing) O2 Flow Rate (L/min): 2 L/min Assess: MEWS Score MEWS Temp: 0 MEWS Systolic: 0 MEWS Pulse: 1 MEWS RR: 1 MEWS LOC: 0 MEWS Score: 2 MEWS Score Color: Yellow Assess: SIRS CRITERIA SIRS Temperature : 0 SIRS Respirations : 1 SIRS Pulse: 1 SIRS WBC: 0 SIRS Score Sum : 2 SIRS Temperature : 0 SIRS Pulse: 1 SIRS Respirations : 1 SIRS WBC: 0 SIRS Score Sum : 2 Assess: if the MEWS score is Yellow or Red Were vital signs taken at a resting state?: Yes Focused Assessment: No change from prior assessment Does the patient meet 2 or more of the SIRS criteria?: Yes Does the patient have a confirmed or suspected source of infection?: No MEWS guidelines implemented : No, previously yellow, continue vital signs every 4 hours        Norlene Duel 02/27/2023, 5:25 AM

## 2023-02-27 NOTE — Progress Notes (Signed)
   02/27/23 0721  Assess: MEWS Score  Temp 97.7 F (36.5 C)  BP (!) 109/91  MAP (mmHg) 97  Pulse Rate (!) 106  ECG Heart Rate (!) 112  Resp 20  SpO2 100 %  O2 Device Room Air  Assess: MEWS Score  MEWS Temp 0  MEWS Systolic 0  MEWS Pulse 2  MEWS RR 0  MEWS LOC 0  MEWS Score 2  MEWS Score Color Yellow  Assess: if the MEWS score is Yellow or Red  Were vital signs taken at a resting state? Yes  Focused Assessment No change from prior assessment  Does the patient meet 2 or more of the SIRS criteria? No  Does the patient have a confirmed or suspected source of infection? No  MEWS guidelines implemented  No, previously yellow, continue vital signs every 4 hours  Assess: SIRS CRITERIA  SIRS Temperature  0  SIRS Pulse 1  SIRS Respirations  0  SIRS WBC 0  SIRS Score Sum  1   Pulse elevated after administration of Prednisone and breathing treatments. MD aware. Continue chronic yellow MEWS protocol.

## 2023-02-27 NOTE — Progress Notes (Signed)
Heart Failure Navigator Progress Note  Assessed for Heart & Vascular TOC clinic readiness.  Patient does not meet criteria due to Advanced Heart Failure Team consulted. . Will cancel her HF TOC appointment at this time.   Navigator will sign off at this  time.   Rhae Hammock, BSN, Scientist, clinical (histocompatibility and immunogenetics) Only

## 2023-02-27 NOTE — Telephone Encounter (Signed)
Pharmacy Patient Advocate Encounter  Insurance verification completed.    The patient has not active pharmacy insurance coverage  The patient is currently admitted and ran test claims for the following: Sherryll Burger, Farxiga, Jardiance.  Copays and coinsurance results were relayed to Inpatient clinical team.

## 2023-02-27 NOTE — Progress Notes (Signed)
Mobility Specialist Progress Note:   02/27/23 1145  Mobility  Activity Ambulated independently in hallway  Level of Assistance Independent  Assistive Device None  Distance Ambulated (ft) 400 ft  Activity Response Tolerated well  Mobility Referral Yes  $Mobility charge 1 Mobility   Pt agreeable to mobility session. Required no physical assistance throughout. SpO2 WFL, pt with audible SOB/wheezing. Pt back in room with all needs met.   Addison Lank Mobility Specialist Please contact via SecureChat or  Rehab office at [phone]

## 2023-02-27 NOTE — Consult Note (Addendum)
Advanced Heart Failure Team Consult Note   Primary Physician: System, Provider Not In PCP-Cardiologist:  None  Reason for Consultation: Acute systolic heart failure (new)   HPI:    Carolyn Oconnor is seen today for evaluation of acute systolic heart failure at the request of Dr. Marland Mcalpine, Internal Medicine.   47 y/o AAF w/ h/o asthma and obesity. No prior cardiac history. Presented to the ED w/ several wk h/o progressive SOB, orthopnea and b/l LE. Found to be in acute CHF. BNP elevated 919. CXR w/ vascular congestion. Also felt to have component of acute asthma exacerbation. Has been admitted and started on IV Lasix, prednisone and bronchodilators. Admit labs WBC 9.0, Hgb 13.4, Na 139, K 4.2, Co2 20, Hs trop 16>>16, Gluc 109. EKG sinus tach 113 bpm. BPs normotensive.   2D echo was completed and showed severely reduced LVEF 20-25%, mild- mod MR, RV hyperdynamic. AHF team asked to further evaluate.   She continues w/ bad persistent cough and wheezing. Denies CP.   Denies tobacco use.  +h/o snoring. Has never had sleep study   Works as a Lawyer    Echo 02/26/23 1. Left ventricular ejection fraction, by best estimation, is 20 to 25%.  The left ventricle has severely decreased function. Left ventricular  endocardial border not optimally defined to evaluate regional wall motion  despite contrast administration. The  left ventricular internal cavity size was mildly to moderately dilated.  Left ventricular diastolic parameters are indeterminate.   2. Right ventricular systolic function is hyperdynamic. The right  ventricular size is normal. Tricuspid regurgitation signal is inadequate  for assessing PA pressure.   3. The mitral valve is normal in structure. Mild to moderate mitral valve  regurgitation. No evidence of mitral stenosis.   4. The aortic valve was not well visualized. Aortic valve regurgitation  is not visualized. No aortic stenosis is present.   Comparison(s): No prior  Echocardiogram.   Review of Systems: [y] = yes, [ ]  = no   General: Weight gain [ ] ; Weight loss [ ] ; Anorexia [ ] ; Fatigue [ ] ; Fever [ ] ; Chills [ ] ; Weakness [ ]   Cardiac: Chest pain/pressure [ ] ; Resting SOB [ ] ; Exertional SOB [ ] ; Orthopnea [ ] ; Pedal Edema [ ] ; Palpitations [ ] ; Syncope [ ] ; Presyncope [ ] ; Paroxysmal nocturnal dyspnea[ ]   Pulmonary: Cough [ ] ; Wheezing[ ] ; Hemoptysis[ ] ; Sputum [ ] ; Snoring [ ]   GI: Vomiting[ ] ; Dysphagia[ ] ; Melena[ ] ; Hematochezia [ ] ; Heartburn[ ] ; Abdominal pain [ ] ; Constipation [ ] ; Diarrhea [ ] ; BRBPR [ ]   GU: Hematuria[ ] ; Dysuria [ ] ; Nocturia[ ]   Vascular: Pain in legs with walking [ ] ; Pain in feet with lying flat [ ] ; Non-healing sores [ ] ; Stroke [ ] ; TIA [ ] ; Slurred speech [ ] ;  Neuro: Headaches[ ] ; Vertigo[ ] ; Seizures[ ] ; Paresthesias[ ] ;Blurred vision [ ] ; Diplopia [ ] ; Vision changes [ ]   Ortho/Skin: Arthritis [ ] ; Joint pain [ ] ; Muscle pain [ ] ; Joint swelling [ ] ; Back Pain [ ] ; Rash [ ]   Psych: Depression[ ] ; Anxiety[ ]   Heme: Bleeding problems [ ] ; Clotting disorders [ ] ; Anemia [ ]   Endocrine: Diabetes [ ] ; Thyroid dysfunction[ ]   Home Medications Prior to Admission medications   Medication Sig Start Date End Date Taking? Authorizing Provider  albuterol (PROVENTIL HFA;VENTOLIN HFA) 108 (90 Base) MCG/ACT inhaler Inhale 2 puffs into the lungs every 4 (four) hours as needed (cough and wheeze). For  asthma attacks. 03/09/16  Yes Roslynn Amble, MD  budesonide-formoterol [facility]) 80-4.5 MCG/ACT inhaler Inhale 2 puffs into the lungs 2 (two) times daily. Patient taking differently: Inhale 2 puffs into the lungs 2 (two) times daily as needed (shortness of breath). 03/09/16  Yes Roslynn Amble, MD  fluticasone (FLONASE) 50 MCG/ACT nasal spray Place 2 sprays into both nostrils daily. Patient taking differently: Place 2 sprays into both nostrils daily as needed for allergies. 01/22/14  Yes Rodolph Bong, MD  ibuprofen  (ADVIL,MOTRIN) 200 MG tablet Take 800 mg by mouth every 6 (six) hours as needed for moderate pain.    Yes [provider]  montelukast (SINGULAIR) 10 MG tablet Take 1 tablet (10 mg total) by mouth at bedtime. 03/09/16  Yes Roslynn Amble, MD    Past Medical History: Past Medical History:  Diagnosis Date   Allergic rhinitis    Asthma    Bronchitis, chronic (HCC)    Pneumonia     Past Surgical History: Past Surgical History:  Procedure Laterality Date   none      Family History: Family History  Problem Relation Age of Onset   Allergies Mother    Heart disease Mother    Allergies Father    Asthma Father    Cancer Sister    Asthma Sister    Allergies Sister    Heart disease Maternal Aunt    Breast cancer Maternal Aunt    Asthma Brother    Allergies Brother     Social History: Social History   Socioeconomic History   Marital status: Single    Spouse name: Not on file   Number of children: 0   Years of education: Not on file   Highest education level: High school graduate  Occupational History   Occupation: [employer]  Tobacco Use   Smoking status: Passive Smoke Exposure - Never Smoker   Smokeless tobacco: Never   Tobacco comments:    Secondhand from Father  Vaping Use   Vaping Use: Never used  Substance and Sexual Activity   Alcohol use: No    Alcohol/week: 0.0 standard drinks of alcohol   Drug use: No    Frequency: 1.0 times per week   Sexual activity: Not on file  Other Topics Concern   Not on file  Social History Narrative   Single, lives with boyfriend   CNA, Editor, commissioning   No recent travel      Adult nurse Pulmonary:   Originally from [place]. Always lived in [place]. Previously hasn't traveled outside of the state. 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 No pets currently. She does have carpet & draperies in her home as well as the bedroom. No indoor plants. No bird exposure. No mold exposure.  Enjoys drawing & reading.    Social Determinants of Health   Financial Resource Strain: High Risk (02/27/2023)   Overall Financial Resource Strain (CARDIA)    Difficulty of Paying Living Expenses: Hard  Food Insecurity: No Food Insecurity (02/26/2023)   Hunger Vital Sign    Worried About Running Out of Food in the Last Year: Never true    Ran Out of Food in the Last Year: Never true  Transportation Needs: No Transportation Needs (02/27/2023)   PRAPARE - Administrator, Civil Service (Medical): No    Lack of Transportation (Non-Medical): No  Physical Activity: Not on file  Stress: Not on file  Social  Connections: Not on file    Allergies:  Allergies  Allergen Reactions   Aspirin     Patient states that it makes her go into an asthma attack.     Objective:    Vital Signs:   Temp:  [97.7 F (36.5 C)-98.6 F (37 C)] 98 F (36.7 C) (05/01 1126) Pulse Rate:  [98-119] 108 (05/01 1126) Resp:  [19-25] 20 (05/01 1126) BP: (108-125)/(76-91) 109/76 (05/01 1126) SpO2:  [92 %-100 %] 97 % (05/01 1126) Weight:  [121.7 kg-122.2 kg] 121.7 kg (05/01 0016) Last BM Date : 02/26/23  Weight change: Filed Weights   02/26/23 1333 02/27/23 0016  Weight: 122.2 kg 121.7 kg    Intake/Output:   Intake/Output Summary (Last 24 hours) at 02/27/2023 1130 Last data filed at 02/27/2023 1125 Gross per 24 hour  Intake 240 ml  Output 1300 ml  Net -1060 ml      Physical Exam    General:  obese AAF, coughing fit, dyspneic w/ conversation  HEENT: normal Neck: supple. Thick neck, JVD not well visualized . Carotids 2+ bilat; no bruits. No lymphadenopathy or thyromegaly appreciated. Cor: PMI nondisplaced. Regular rate & rhythm. No rubs, gallops or murmurs. Lungs: CTAB Abdomen: obese, soft, nontender, nondistended. No hepatosplenomegaly. No bruits or masses. Good bowel sounds. Extremities: no cyanosis, clubbing, rash, 2+ edema Neuro: alert & orientedx3, cranial nerves grossly intact. moves all  4 extremities w/o difficulty. Affect pleasant   Telemetry   NSR 90s  EKG    Sinus tach 113 bpm   Labs   Basic Metabolic Panel: Recent Labs  Lab 02/26/23 0106 02/27/23 0100  NA 139 136  K 4.2 4.4  CL 107 106  CO2 20* 21*  GLUCOSE 109* 115*  BUN 18 18  CREATININE 0.94 0.94  CALCIUM 9.1 9.2    Liver Function Tests: Recent Labs  Lab 02/27/23 0100  AST 24  ALT 20  ALKPHOS 44  BILITOT 0.8  PROT 7.3  ALBUMIN 3.5   No results for input(s): "LIPASE", "AMYLASE" in the last 168 hours. No results for input(s): "AMMONIA" in the last 168 hours.  CBC: Recent Labs  Lab 02/26/23 0106 02/27/23 0100  WBC 9.0 11.1*  NEUTROABS  --  8.7*  HGB 13.4 13.7  HCT 40.1 41.4  MCV 85.3 84.8  PLT 277 313    Cardiac Enzymes: No results for input(s): "CKTOTAL", "CKMB", "CKMBINDEX", "TROPONINI" in the last 168 hours.  BNP: BNP (last 3 results) Recent Labs    02/26/23 0106 02/27/23 0100  BNP 919.3* 868.7*    ProBNP (last 3 results) No results for input(s): "PROBNP" in the last 8760 hours.   CBG: No results for input(s): "GLUCAP" in the last 168 hours.  Coagulation Studies: No results for input(s): "LABPROT", "INR" in the last 72 hours.   Imaging   DG CHEST PORT 1 VIEW  Result Date: 02/27/2023 CLINICAL DATA:  Shortness of breath. EXAM: PORTABLE CHEST 1 VIEW COMPARISON:  February 26, 2023. FINDINGS: Mild cardiomegaly is noted with central pulmonary vascular congestion. No consolidative process is noted. Bony thorax is unremarkable. IMPRESSION: Mild cardiomegaly with central pulmonary vascular congestion. Electronically Signed   By: Lupita Raider M.D.   On: 02/27/2023 10:14   ECHOCARDIOGRAM COMPLETE  Result Date: 02/26/2023    ECHOCARDIOGRAM REPORT   Patient Name:   Carolyn Oconnor Date of Exam: 02/26/2023 Medical Rec #:  [id number]       Height:       62.0 in Accession #:  [id number]      Weight:       237.0 lb Date of Birth:  1976/01/07      BSA:          2.055 m  Patient Age:    46 years        BP:           125/85 mmHg Patient Gender: F               HR:           118 bpm. Exam Location:  Inpatient Procedure: 2D Echo, Cardiac Doppler, Color Doppler and Intracardiac            Opacification Agent Indications:    CHF  History:        Patient has no prior history of Echocardiogram examinations.                 CHF. Asthma.  Sonographer:    Milbert Coulter Referring Phys: [id number] John Giovanni  Sonographer Comments: Patient is obese. Image acquisition challenging due to patient body habitus. IMPRESSIONS  1. Left ventricular ejection fraction, by best estimation, is 20 to 25%. The left ventricle has severely decreased function. Left ventricular endocardial border not optimally defined to evaluate regional wall motion despite contrast administration. The left ventricular internal cavity size was mildly to moderately dilated. Left ventricular diastolic parameters are indeterminate.  2. Right ventricular systolic function is hyperdynamic. The right ventricular size is normal. Tricuspid regurgitation signal is inadequate for assessing PA pressure.  3. The mitral valve is normal in structure. Mild to moderate mitral valve regurgitation. No evidence of mitral stenosis.  4. The aortic valve was not well visualized. Aortic valve regurgitation is not visualized. No aortic stenosis is present. Comparison(s): No prior Echocardiogram. Conclusion(s)/Recommendation(s): Reaching out to primary team. FINDINGS  Left Ventricle: Left ventricular ejection fraction, by estimation, is 20 to 25%. The left ventricle has severely decreased function. Left ventricular endocardial border not optimally defined to evaluate regional wall motion. Definity contrast agent was given IV to delineate the left ventricular endocardial borders. The left ventricular internal cavity size was mildly to moderately dilated. Suboptimal image quality limits for assessment of left ventricular hypertrophy. Left ventricular  diastolic parameters are indeterminate. Right Ventricle: The right ventricular size is normal. No increase in right ventricular wall thickness. Right ventricular systolic function is hyperdynamic. Tricuspid regurgitation signal is inadequate for assessing PA pressure. Left Atrium: Left atrial size was normal in size. Right Atrium: Right atrial size was normal in size. Pericardium: Trivial pericardial effusion is present. The pericardial effusion is posterior to the left ventricle. Mitral Valve: The mitral valve is normal in structure. Mild to moderate mitral valve regurgitation. No evidence of mitral valve stenosis. Tricuspid Valve: The tricuspid valve is not well visualized. Tricuspid valve regurgitation is not demonstrated. No evidence of tricuspid stenosis. Aortic Valve: The aortic valve was not well visualized. Aortic valve regurgitation is not visualized. No aortic stenosis is present. Pulmonic Valve: The pulmonic valve was normal in structure. Pulmonic valve regurgitation is not visualized. No evidence of pulmonic stenosis. Aorta: The aortic root is normal in size and structure. IAS/Shunts: No atrial level shunt detected by color flow Doppler.  LEFT VENTRICLE PLAX 2D LVIDd:         5.70 cm LVIDs:         5.50 cm LV PW:         1.10 cm LV IVS:        1.20  cm LVOT diam:     2.10 cm LVOT Area:     3.46 cm  RIGHT VENTRICLE RV Basal diam:  3.60 cm RV Mid diam:    2.80 cm RV S prime:     14.50 cm/s TAPSE (M-mode): 2.1 cm LEFT ATRIUM             Index        RIGHT ATRIUM           Index LA diam:        4.60 cm 2.24 cm/m   RA Area:     19.30 cm LA Vol (A2C):   56.7 ml 27.60 ml/m  RA Volume:   60.70 ml  29.54 ml/m LA Vol (A4C):   56.1 ml 27.30 ml/m LA Biplane Vol: 63.5 ml 30.90 ml/m   SHUNTS Systemic Diam: 2.10 cm Riley Lam MD Electronically signed by Riley Lam MD Signature Date/Time: 02/26/2023/3:25:57 PM    Final      Medications:     Current Medications:  enoxaparin (LOVENOX)  injection  40 mg Subcutaneous Q24H   furosemide  40 mg Intravenous BID   ipratropium  0.5 mg Nebulization BID   levalbuterol  0.63 mg Nebulization BID   montelukast  10 mg Oral QHS   predniSONE  60 mg Oral Daily    Infusions:     Patient Profile   47 y/o AAF w/ h/o asthma and obesity admitted w/ new systolic heart failure.   Assessment/Plan   1. Acute Systolic Heart Failure - new diagnosis - Echo EF 20-25%, RV hyperdynamic  - Etiology uncertain. BPs have been normotensive. Hs trops negative  - continue diuresis today. Will screen for FASTR Trial  - plan [facility] likey Friday  - cMRI if cath unrevealing  - start spiro 12.5 mg daily  - start losartan 12.5 mg daily (BP too soft for Entresto)  - start digoxin 0.125 mg daily  - no ? blocker yet w/ acute decompensation. If added in future, would consider bisoprolol given asthma.    2. Acute Asthma Exacerbation  - per IM   3. Morbid obesity - Body mass index is 49.07 kg/m. - consider GLP1RA   Length of Stay: 1  Brittainy Simmons, PA-C  02/27/2023, 11:30 AM  Advanced Heart Failure Team Pager [phone] (M-F; 7a - 5p)  Please contact [facility] for night-coverage after hours (4p -7a ) and weekends on [email or web address]   Patient seen and examined with the above-signed Advanced Practice Provider and/or Housestaff. I personally reviewed laboratory data, imaging studies and relevant notes. I independently examined the patient and formulated the important aspects of the plan. I have edited the note to reflect any of my changes or salient points. I have personally discussed the plan with the patient and/or family.  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  CXR with pulmonary edema. BNP ~1K. Echo read as EF 20-25%. (I suspect more like 30-35%).  Remains SOB at rest. No CP. Hstrop normal. ECG with sinus tach.   General:  Obese woman sitting up on side of bed with hacking  cough HEENT: normal Neck: supple. Unable to see JVP. Carotids 2+ bilat; no bruits. No lymphadenopathy or thryomegaly appreciated. Cor: PMI nondisplaced. Regular rate & rhythm. No rubs, gallops or murmurs. Lungs: clear Abdomen: obese soft, nontender, nondistended. No hepatosplenomegaly. No bruits or masses. Good bowel sounds. Extremities: no cyanosis, clubbing, rash, 2-3+ edema into thigh Neuro: alert & orientedx3,  cranial nerves grossly intact. moves all 4 extremities w/o difficulty. Affect pleasant  New onset systolic HF with marked volume overload. Etiology of LV dysfunction unclear. Diurese with IV lasix. Start GDMT. Plane UNNA boots.   Will need R/L cath on Friday followed by cMRI.   Arvilla Meres, MD  3:05 PM

## 2023-02-27 NOTE — Progress Notes (Signed)
PROGRESS NOTE    ANALLELY Oconnor  VWU:[id number] DOB: [birth year] DOA: 02/26/2023 PCP: System, Provider Not In   Brief Narrative:  No notes on file    Assessment and Plan: No notes have been filed under this hospital service. Service: Hospitalist  Acute Systolic CHF -Patient with no documented history of CHF or prior echo results in the chart presenting with 1 month history of progressively worsening dyspnea, orthopnea, bilateral lower extremity edema, and weight gain.   -BNP went from 919.3 -> 868.7 -Chest x-ray showing central vascular congestion, BNP 919 -Continue IV diuresis with Lasix but increase the dose to 50 mg BID dosing  Intake/Output Summary (Last 24 hours) at 02/27/2023 1324 Last data filed at 02/27/2023 1233 Gross per 24 hour  Intake 240 ml  Output 1650 ml  Net -1410 ml  -Echocardiogram ordered and showed "Left ventricular ejection fraction, by best estimation, is 20 to 25%. The left ventricle has severely decreased function. Left ventricular  endocardial border not optimally defined to evaluate regional wall motion despite contrast administration. The left ventricular internal cavity size was mildly to moderately dilated. Left ventricular diastolic parameters are indeterminate. Right ventricular systolic function is hyperdynamic. The right ventricular size is normal. Tricuspid regurgitation signal is inadequate for assessing PA pressure. The mitral valve is normal in structure. Mild to moderate mitral valve regurgitation. No evidence of mitral stenosis. The aortic valve was not well visualized. Aortic valve regurgitation is not visualized. No aortic stenosis is present." -Fluid and salt restriction to be continued. -Patient still clearly is symptomatic with conversational dyspnea although she is not oxygen dependent and is on room air. -Given new onset CHF we have consulted Cardiology for further evaluation recommendations -Heart failure team has been consulted and they are  recommending a right and left heart cath tomorrow as well as a cardiac MRI if the cath is unrevealing -They started the patient on spironolactone 12.5 mg p.o. daily, losartan 12.5 mg p.o. daily given that her blood pressure is too soft for Entresto at this time and also started digoxin 0.125 mg p.o. daily -Currently the patient has not been started on a beta-blocker given her acute decompensation but if it is added in the future they are going to consider bisoprolol given the patient's asthma -UDS negative    Acute asthma exacerbation -Overall wheezing has improved, however still has clearly conversational dyspnea and tachycardia -Continue prednisone 60, DuoNebs every 6 hours and Singulair SpO2: 97 % O2 Flow Rate (L/min): 2 L/min; Currently patient is on room air -Continue supplemental oxygen via nasal cannula wean O2 as tolerated Continuous pulse oximetry maintain O2 saturations greater than 90% -Will add guaifenesin 1200 mg p.o. twice daily flutter valve, incentive spirometry -Respiratory virus panel by PCR was negative for influenza A and B as well as respiratory syncytial virus and SARS-CoV-2 -Have added Xopenex and Atrovent twice daily as well as every 6 as needed for wheezing and shortness of breath -Patient will need ambulatory home O2 screen prior to discharge as well as repeat chest x-ray -Repeat CXR this AM done and showed "Mild cardiomegaly is noted with central pulmonary vascular congestion. No consolidative process is noted. Bony thorax is unremarkable."   Leukocytosis -WBC Trend: -In the setting of Steroid Demargination Recent Labs  Lab 02/26/23 0106 02/27/23 0100  WBC 9.0 11.1*  -Continue to Monitor and Trend and repeat CBC in the AM  Metabolic Acidosis -Mild. CO2 is now 21, AG is 9, Chloride Level is now 106 -Continue to Monitor and  Trend and repeat CMP in the AM  Morbid Obesity -Complicates overall prognosis and care -Estimated body mass index is 49.07 kg/m as  calculated from the following:   Height as of this encounter: 5\' 2"  (1.575 m).   Weight as of this encounter: 121.7 kg.  -Weight Loss and Dietary Counseling given   DVT prophylaxis: enoxaparin (LOVENOX) injection 40 mg Start: 02/26/23 1400    Code Status: Full Code Family Communication: No family at bedside  Disposition Plan:  Level of care: Telemetry Cardiac Status is: Inpatient Remains inpatient appropriate because: His further clinical improvement and cardiology been consulted for further evaluation   Consultants:  Cardiology  Procedures:  ECHOCARDIOGRAM IMPRESSIONS     1. Left ventricular ejection fraction, by best estimation, is 20 to 25%.  The left ventricle has severely decreased function. Left ventricular  endocardial border not optimally defined to evaluate regional wall motion  despite contrast administration. The  left ventricular internal cavity size was mildly to moderately dilated.  Left ventricular diastolic parameters are indeterminate.   2. Right ventricular systolic function is hyperdynamic. The right  ventricular size is normal. Tricuspid regurgitation signal is inadequate  for assessing PA pressure.   3. The mitral valve is normal in structure. Mild to moderate mitral valve  regurgitation. No evidence of mitral stenosis.   4. The aortic valve was not well visualized. Aortic valve regurgitation  is not visualized. No aortic stenosis is present.   Comparison(s): No prior Echocardiogram.   Conclusion(s)/Recommendation(s): Reaching out to primary team.   FINDINGS   Left Ventricle: Left ventricular ejection fraction, by estimation, is 20  to 25%. The left ventricle has severely decreased function. Left  ventricular endocardial border not optimally defined to evaluate regional  wall motion. Definity contrast agent was  given IV to delineate the left ventricular endocardial borders. The left  ventricular internal cavity size was mildly to moderately  dilated.  Suboptimal image quality limits for assessment of left ventricular  hypertrophy. Left ventricular diastolic  parameters are indeterminate.   Right Ventricle: The right ventricular size is normal. No increase in  right ventricular wall thickness. Right ventricular systolic function is  hyperdynamic. Tricuspid regurgitation signal is inadequate for assessing  PA pressure.   Left Atrium: Left atrial size was normal in size.   Right Atrium: Right atrial size was normal in size.   Pericardium: Trivial pericardial effusion is present. The pericardial  effusion is posterior to the left ventricle.   Mitral Valve: The mitral valve is normal in structure. Mild to moderate  mitral valve regurgitation. No evidence of mitral valve stenosis.   Tricuspid Valve: The tricuspid valve is not well visualized. Tricuspid  valve regurgitation is not demonstrated. No evidence of tricuspid  stenosis.   Aortic Valve: The aortic valve was not well visualized. Aortic valve  regurgitation is not visualized. No aortic stenosis is present.   Pulmonic Valve: The pulmonic valve was normal in structure. Pulmonic valve  regurgitation is not visualized. No evidence of pulmonic stenosis.   Aorta: The aortic root is normal in size and structure.   IAS/Shunts: No atrial level shunt detected by color flow Doppler.     LEFT VENTRICLE  PLAX 2D  LVIDd:         5.70 cm  LVIDs:         5.50 cm  LV PW:         1.10 cm  LV IVS:        1.20 cm  LVOT diam:  2.10 cm  LVOT Area:     3.46 cm     RIGHT VENTRICLE  RV Basal diam:  3.60 cm  RV Mid diam:    2.80 cm  RV S prime:     14.50 cm/s  TAPSE (M-mode): 2.1 cm   LEFT ATRIUM             Index        RIGHT ATRIUM           Index  LA diam:        4.60 cm 2.24 cm/m   RA Area:     19.30 cm  LA Vol (A2C):   56.7 ml 27.60 ml/m  RA Volume:   60.70 ml  29.54 ml/m  LA Vol (A4C):   56.1 ml 27.30 ml/m  LA Biplane Vol: 63.5 ml 30.90 ml/m     SHUNTS   Systemic Diam: 2.10 cm   Antimicrobials:  Anti-infectives (From admission, onward)    None       Subjective: Seen and examined at bedside and she was still little dyspneic on examination.  Thinks she is doing a little bit better but continues to cough up yellowish-green sputum.  No nausea or vomiting.  Denies any lightheadedness or dizziness.  No other concerns at this time thinks her legs are still little bit swollen.  Objective: Vitals:   02/26/23 2119 02/27/23 0016 02/27/23 0513 02/27/23 0514  BP:  122/86  108/81  Pulse:  (!) 111  98  Resp:  20 20 (!) 25  Temp:  98.3 F (36.8 C) 98.3 F (36.8 C)   TempSrc:  Oral    SpO2: 100% 100% 98%   Weight:  121.7 kg    Height:        Intake/Output Summary (Last 24 hours) at 02/27/2023 0820 Last data filed at 02/26/2023 1858 Gross per 24 hour  Intake --  Output 1300 ml  Net -1300 ml   Filed Weights   02/26/23 1333 02/27/23 0016  Weight: 122.2 kg 121.7 kg   Examination: Physical Exam:  Constitutional: WN/WD morbidly obese African-American female in mild respiratory distress Respiratory: Diminished to auscultation bilaterally with some coarse breath sounds and some crackles noted.  No appreciable wheezing or rales.  No appreciable rhonchi noted.  Has normal respiratory effort but is little dyspneic while conversing Cardiovascular: RRR, no murmurs / rubs / gallops. S1 and S2 auscultated.  1+ lower extremity edema. 2+ pedal pulses. No carotid bruits.  Abdomen: Soft, non-tender, distended secondary to body habitus. No masses palpated.  GU: Deferred. Musculoskeletal: No clubbing / cyanosis of digits/nails. No joint deformity upper and lower extremities.  Skin: No rashes, lesions, ulcers on a limited skin evaluation. No induration; Warm and dry.  Neurologic: CN 2-12 grossly intact with no focal deficits. Romberg sign and cerebellar reflexes not assessed.  Psychiatric: Normal judgment and insight. Alert and oriented x 3. Normal mood and  appropriate affect.   Data Reviewed: I have personally reviewed following labs and imaging studies  CBC: Recent Labs  Lab 02/26/23 0106 02/27/23 0100  WBC 9.0 11.1*  NEUTROABS  --  8.7*  HGB 13.4 13.7  HCT 40.1 41.4  MCV 85.3 84.8  PLT 277 313   Basic Metabolic Panel: Recent Labs  Lab 02/26/23 0106 02/27/23 0100  NA 139 136  K 4.2 4.4  CL 107 106  CO2 20* 21*  GLUCOSE 109* 115*  BUN 18 18  CREATININE 0.94 0.94  CALCIUM 9.1 9.2  GFR: Estimated Creatinine Clearance: 93.9 mL/min (by C-G formula based on SCr of 0.94 mg/dL). Liver Function Tests: Recent Labs  Lab 02/27/23 0100  AST 24  ALT 20  ALKPHOS 44  BILITOT 0.8  PROT 7.3  ALBUMIN 3.5   No results for input(s): "LIPASE", "AMYLASE" in the last 168 hours. No results for input(s): "AMMONIA" in the last 168 hours. Coagulation Profile: No results for input(s): "INR", "PROTIME" in the last 168 hours. Cardiac Enzymes: No results for input(s): "CKTOTAL", "CKMB", "CKMBINDEX", "TROPONINI" in the last 168 hours. BNP (last 3 results) No results for input(s): "PROBNP" in the last 8760 hours. HbA1C: No results for input(s): "HGBA1C" in the last 72 hours. CBG: No results for input(s): "GLUCAP" in the last 168 hours. Lipid Profile: No results for input(s): "CHOL", "HDL", "LDLCALC", "TRIG", "CHOLHDL", "LDLDIRECT" in the last 72 hours. Thyroid Function Tests: No results for input(s): "TSH", "T4TOTAL", "FREET4", "T3FREE", "THYROIDAB" in the last 72 hours. Anemia Panel: No results for input(s): "VITAMINB12", "FOLATE", "FERRITIN", "TIBC", "IRON", "RETICCTPCT" in the last 72 hours. Sepsis Labs: No results for input(s): "PROCALCITON", "LATICACIDVEN" in the last 168 hours.  Recent Results (from the past 240 hour(s))  Resp panel by RT-PCR (RSV, Flu A&B, Covid) Anterior Nasal Swab     Status: None   Collection Time: 02/26/23  1:15 AM   Specimen: Anterior Nasal Swab  Result Value Ref Range Status   SARS Coronavirus 2 by RT  PCR NEGATIVE NEGATIVE Final   Influenza A by PCR NEGATIVE NEGATIVE Final   Influenza B by PCR NEGATIVE NEGATIVE Final    Comment: (NOTE) The Xpert Xpress SARS-CoV-2/FLU/RSV plus assay is intended as an aid in the diagnosis of influenza from Nasopharyngeal swab specimens and should not be used as a sole basis for treatment. Nasal washings and aspirates are unacceptable for Xpert Xpress SARS-CoV-2/FLU/RSV testing.  Fact Sheet for Patients: BloggerCourse.com  Fact Sheet for Healthcare Providers: SeriousBroker.it  This test is not yet approved or cleared by the [place] FDA and has been authorized for detection and/or diagnosis of SARS-CoV-2 by FDA under an Emergency Use Authorization (EUA). This EUA will remain in effect (meaning this test can be used) for the duration of the COVID-19 declaration under Section 564(b)(1) of the Act, 21 U.S.C. section 360bbb-3(b)(1), unless the authorization is terminated or revoked.     Resp Syncytial Virus by PCR NEGATIVE NEGATIVE Final    Comment: (NOTE) Fact Sheet for Patients: BloggerCourse.com  Fact Sheet for Healthcare Providers: SeriousBroker.it  This test is not yet approved or cleared by the [place] FDA and has been authorized for detection and/or diagnosis of SARS-CoV-2 by FDA under an Emergency Use Authorization (EUA). This EUA will remain in effect (meaning this test can be used) for the duration of the COVID-19 declaration under Section 564(b)(1) of the Act, 21 U.S.C. section 360bbb-3(b)(1), unless the authorization is terminated or revoked.  Performed at [facility], [address] [place], [place] [address]      Radiology Studies: ECHOCARDIOGRAM COMPLETE  Result Date: 02/26/2023    ECHOCARDIOGRAM REPORT   Patient Name:   BRAD MCGAUGHY Date of Exam: 02/26/2023 Medical Rec #:  [id number]       Height:        62.0 in Accession #:    [id number]      Weight:       237.0 lb Date of Birth:  [birth year]      BSA:  2.055 m Patient Age:    46 years        BP:           125/85 mmHg Patient Gender: F               HR:           118 bpm. Exam Location:  Inpatient Procedure: 2D Echo, Cardiac Doppler, Color Doppler and Intracardiac            Opacification Agent Indications:    CHF  History:        Patient has no prior history of Echocardiogram examinations.                 CHF. Asthma.  Sonographer:    Milbert Coulter Referring Phys: [id number] John Giovanni  Sonographer Comments: Patient is obese. Image acquisition challenging due to patient body habitus. IMPRESSIONS  1. Left ventricular ejection fraction, by best estimation, is 20 to 25%. The left ventricle has severely decreased function. Left ventricular endocardial border not optimally defined to evaluate regional wall motion despite contrast administration. The left ventricular internal cavity size was mildly to moderately dilated. Left ventricular diastolic parameters are indeterminate.  2. Right ventricular systolic function is hyperdynamic. The right ventricular size is normal. Tricuspid regurgitation signal is inadequate for assessing PA pressure.  3. The mitral valve is normal in structure. Mild to moderate mitral valve regurgitation. No evidence of mitral stenosis.  4. The aortic valve was not well visualized. Aortic valve regurgitation is not visualized. No aortic stenosis is present. Comparison(s): No prior Echocardiogram. Conclusion(s)/Recommendation(s): Reaching out to primary team. FINDINGS  Left Ventricle: Left ventricular ejection fraction, by estimation, is 20 to 25%. The left ventricle has severely decreased function. Left ventricular endocardial border not optimally defined to evaluate regional wall motion. Definity contrast agent was given IV to delineate the left ventricular endocardial borders. The left ventricular internal cavity size was mildly to  moderately dilated. Suboptimal image quality limits for assessment of left ventricular hypertrophy. Left ventricular diastolic parameters are indeterminate. Right Ventricle: The right ventricular size is normal. No increase in right ventricular wall thickness. Right ventricular systolic function is hyperdynamic. Tricuspid regurgitation signal is inadequate for assessing PA pressure. Left Atrium: Left atrial size was normal in size. Right Atrium: Right atrial size was normal in size. Pericardium: Trivial pericardial effusion is present. The pericardial effusion is posterior to the left ventricle. Mitral Valve: The mitral valve is normal in structure. Mild to moderate mitral valve regurgitation. No evidence of mitral valve stenosis. Tricuspid Valve: The tricuspid valve is not well visualized. Tricuspid valve regurgitation is not demonstrated. No evidence of tricuspid stenosis. Aortic Valve: The aortic valve was not well visualized. Aortic valve regurgitation is not visualized. No aortic stenosis is present. Pulmonic Valve: The pulmonic valve was normal in structure. Pulmonic valve regurgitation is not visualized. No evidence of pulmonic stenosis. Aorta: The aortic root is normal in size and structure. IAS/Shunts: No atrial level shunt detected by color flow Doppler.  LEFT VENTRICLE PLAX 2D LVIDd:         5.70 cm LVIDs:         5.50 cm LV PW:         1.10 cm LV IVS:        1.20 cm LVOT diam:     2.10 cm LVOT Area:     3.46 cm  RIGHT VENTRICLE RV Basal diam:  3.60 cm RV Mid diam:    2.80 cm RV S  prime:     14.50 cm/s TAPSE (M-mode): 2.1 cm LEFT ATRIUM             Index        RIGHT ATRIUM           Index LA diam:        4.60 cm 2.24 cm/m   RA Area:     19.30 cm LA Vol (A2C):   56.7 ml 27.60 ml/m  RA Volume:   60.70 ml  29.54 ml/m LA Vol (A4C):   56.1 ml 27.30 ml/m LA Biplane Vol: 63.5 ml 30.90 ml/m   SHUNTS Systemic Diam: 2.10 cm Riley Lam MD Electronically signed by Riley Lam MD Signature  Date/Time: 02/26/2023/3:25:57 PM    Final    DG Chest Port 1 View  Result Date: 02/26/2023 CLINICAL DATA:  Shortness of breath EXAM: PORTABLE CHEST 1 VIEW COMPARISON:  07/04/2016 FINDINGS: Cardiac shadow is enlarged. Central vascular congestion is noted without significant edema. No focal infiltrate is noted. No bony abnormality is seen. IMPRESSION: Changes consistent with CHF. Electronically Signed   By: Alcide Clever M.D.   On: 02/26/2023 01:21    Scheduled Meds:  enoxaparin (LOVENOX) injection  40 mg Subcutaneous Q24H   furosemide  40 mg Intravenous Daily   ipratropium  0.5 mg Nebulization BID   levalbuterol  0.63 mg Nebulization BID   montelukast  10 mg Oral QHS   predniSONE  60 mg Oral Daily   Continuous Infusions:   LOS: 1 day   Marguerita Merles, DO [facility] Available via Epic secure chat 7am-7pm After these hours, please refer to coverage provider listed on [email or web address] 02/27/2023, 8:20 AM

## 2023-02-27 NOTE — Progress Notes (Signed)
   Heart Failure Stewardship Pharmacist Progress Note   PCP: System, Provider Not In PCP-Cardiologist: None    HPI:  47 YO female with a PMH of asthma, allergic rhinitis, chronic bronchitis, obesity.   She presents to the ED 4/30 AM with complaints of shortness of breath, cough, wheezing, and bilateral lower extremity edema x1 month. She denies a history of CHF or other heart issues. Volume overloaded on exam with rales and significant bilateral lower extremity edema. CXR showing central vascular congestion. Newly diagnosed HFrEF with 4/30 echo showing EF 20-25% and mild-moderate MV regurg. Planning for [facility] on Friday, 5/3.   Current HF Medications: Diuretic: furosemide IV 40 mg BID  Prior to admission HF Medications: None  Pertinent Lab Values: Serum creatinine 0.94, BUN 18, Potassium 4.4, Sodium 136, BNP 868  Vital Signs: Weight: 268 lbs (admission weight: 269 lbs) Blood pressure: 100s/80s  Heart rate: 110s  I/O: -4L yesterday; net -4.7L  Medication Assistance / Insurance Benefits Check: Does the patient have prescription insurance?  No - insurance starting 6/1 Type of insurance plan:TBD  Outpatient Pharmacy:  Prior to admission outpatient pharmacy: [facility] - [address], [place] Is the patient willing to use [facility] pharmacy at discharge? Pending Is the patient willing to transition their outpatient pharmacy to utilize a [facility] outpatient pharmacy?   Pending    Assessment: 1. Acute systolic CHF (LVEF 20-25%), pending ischemic evaluation. NYHA class III-IV symptoms. - Agree with increasing IV furosemide to 80mg  BID  - BP limiting addition ARNI - Consider initiating spironolactone 12.5 mg daily and low-dose losartan today; monitor K  - Consider initiating SGLT2i at discharge  - Holding beta blocker for now given acute decompensation--consider digoxin for now  - Can consider addition of ARNI and SGLT2i but will plan to use free trial cards at discharge  until pt acquires active insurance    Plan: 1) Medication changes recommended at this time: - Start spironolactone 12.5 mg daily - Start losartan 12.5 mg daily  - Increase IV furosemide to 80 mg BID - F/u digoxin start   2) Patient assistance: - None currently   3) Inpatient HF taking over, will sign off for now     Cherylin Mylar, PharmD PGY1 Pharmacy Resident 5/1/202411:51 AM

## 2023-02-27 NOTE — Progress Notes (Signed)
CN placed new urine specimen cup and urine collection into bathroom for urine sample. CN notified patient to notify nursing staff when urine sample can be obtained. Patient verbalize understanding. NT notified.

## 2023-02-28 ENCOUNTER — Inpatient Hospital Stay (HOSPITAL_COMMUNITY): Payer: Self-pay

## 2023-02-28 DIAGNOSIS — D72829 Elevated white blood cell count, unspecified: Secondary | ICD-10-CM

## 2023-02-28 DIAGNOSIS — E8809 Other disorders of plasma-protein metabolism, not elsewhere classified: Secondary | ICD-10-CM

## 2023-02-28 LAB — COMPREHENSIVE METABOLIC PANEL
ALT: 19 U/L (ref 0–44)
AST: 19 U/L (ref 15–41)
Albumin: 3.4 g/dL — ABNORMAL LOW (ref 3.5–5.0)
Alkaline Phosphatase: 37 U/L — ABNORMAL LOW (ref 38–126)
Anion gap: 10 (ref 5–15)
BUN: 20 mg/dL (ref 6–20)
CO2: 22 mmol/L (ref 22–32)
Calcium: 8.8 mg/dL — ABNORMAL LOW (ref 8.9–10.3)
Chloride: 104 mmol/L (ref 98–111)
Creatinine, Ser: 0.96 mg/dL (ref 0.44–1.00)
GFR, Estimated: >60 mL/min (ref >60)
Glucose, Bld: 106 mg/dL — ABNORMAL HIGH (ref 70–99)
Potassium: 3.5 mmol/L (ref 3.5–5.1)
Sodium: 136 mmol/L (ref 135–145)
Total Bilirubin: 0.8 mg/dL (ref 0.3–1.2)
Total Protein: 6.4 g/dL — ABNORMAL LOW (ref 6.5–8.1)

## 2023-02-28 LAB — CBC WITH DIFFERENTIAL/PLATELET
Abs Immature Granulocytes: 0.03 10*3/uL (ref 0.00–0.07)
Basophils Absolute: 0 10*3/uL (ref 0.0–0.1)
Basophils Relative: 0 %
Eosinophils Absolute: 0 10*3/uL (ref 0.0–0.5)
Eosinophils Relative: 0 %
HCT: 40.2 % (ref 36.0–46.0)
Hemoglobin: 13 g/dL (ref 12.0–15.0)
Immature Granulocytes: 0 %
Lymphocytes Relative: 16 %
Lymphs Abs: 1.8 10*3/uL (ref 0.7–4.0)
MCH: 27.5 pg (ref 26.0–34.0)
MCHC: 32.3 g/dL (ref 30.0–36.0)
MCV: 85.2 fL (ref 80.0–100.0)
Monocytes Absolute: 0.8 10*3/uL (ref 0.1–1.0)
Monocytes Relative: 7 %
Neutro Abs: 8.8 10*3/uL — ABNORMAL HIGH (ref 1.7–7.7)
Neutrophils Relative %: 77 %
Platelets: 298 10*3/uL (ref 150–400)
RBC: 4.72 MIL/uL (ref 3.87–5.11)
RDW: 14.6 % (ref 11.5–15.5)
WBC: 11.4 10*3/uL — ABNORMAL HIGH (ref 4.0–10.5)
nRBC: 0 % (ref 0.0–0.2)

## 2023-02-28 LAB — BRAIN NATRIURETIC PEPTIDE: B Natriuretic Peptide: 432.8 pg/mL — ABNORMAL HIGH (ref 0.0–100.0)

## 2023-02-28 LAB — PHOSPHORUS: Phosphorus: 4.5 mg/dL (ref 2.5–4.6)

## 2023-02-28 LAB — MAGNESIUM: Magnesium: 2.3 mg/dL (ref 1.7–2.4)

## 2023-02-28 MED ORDER — METOLAZONE 2.5 MG PO TABS
2.5000 mg | ORAL_TABLET | Freq: Once | ORAL | Status: AC
  Administered 2023-02-28: 2.5 mg via ORAL
  Filled 2023-02-28: qty 1

## 2023-02-28 MED ORDER — DIGOXIN 125 MCG PO TABS
0.1250 mg | ORAL_TABLET | Freq: Every day | ORAL | Status: DC
  Administered 2023-02-28 – 2023-03-04 (×5): 0.125 mg via ORAL
  Filled 2023-02-28 (×5): qty 1

## 2023-02-28 MED ORDER — SODIUM CHLORIDE 0.9% FLUSH: 3.0000 mL | INTRAVENOUS | Status: DC | PRN

## 2023-02-28 MED ORDER — SPIRONOLACTONE 25 MG PO TABS
25.0000 mg | ORAL_TABLET | Freq: Every day | ORAL | Status: DC
  Administered 2023-03-01 – 2023-03-04 (×4): 25 mg via ORAL
  Filled 2023-02-28 (×4): qty 1

## 2023-02-28 MED ORDER — SODIUM CHLORIDE 0.9% FLUSH
3.0000 mL | Freq: Two times a day (BID) | INTRAVENOUS | Status: DC
  Administered 2023-02-28 – 2023-03-03 (×4): 3 mL via INTRAVENOUS

## 2023-02-28 MED ORDER — POTASSIUM CHLORIDE CRYS ER 20 MEQ PO TBCR
40.0000 meq | EXTENDED_RELEASE_TABLET | Freq: Two times a day (BID) | ORAL | Status: AC
  Administered 2023-02-28 (×2): 40 meq via ORAL
  Filled 2023-02-28 (×2): qty 2

## 2023-02-28 MED ORDER — POTASSIUM CHLORIDE CRYS ER 20 MEQ PO TBCR
40.0000 meq | EXTENDED_RELEASE_TABLET | Freq: Once | ORAL | Status: AC
  Administered 2023-02-28: 40 meq via ORAL
  Filled 2023-02-28: qty 2

## 2023-02-28 NOTE — Plan of Care (Signed)
?  Problem: Education: ?Goal: Ability to demonstrate management of disease process will improve ?Outcome: Progressing ?  ?Problem: Activity: ?Goal: Capacity to carry out activities will improve ?Outcome: Progressing ?  ?Problem: Cardiac: ?Goal: Ability to achieve and maintain adequate cardiopulmonary perfusion will improve ?Outcome: Progressing ?  ?

## 2023-02-28 NOTE — Progress Notes (Signed)
Mobility Specialist Progress Note:   02/28/23 1100  Mobility  Activity Ambulated independently in hallway  Level of Assistance Independent  Assistive Device None  Distance Ambulated (ft) 400 ft  Activity Response Tolerated well  Mobility Referral Yes  $Mobility charge 1 Mobility   Pt agreeable to mobility session. No physical assistance required. SpO2 88-92% on RA with good signal. Pt back sitting EOB with all needs met.   Addison Lank Mobility Specialist Please contact via SecureChat or  Rehab office at [phone]

## 2023-02-28 NOTE — Progress Notes (Signed)
   02/28/23 0850  Assess: MEWS Score  BP 120/70  MAP (mmHg) 85  Pulse Rate (!) 110  ECG Heart Rate (!) 111  Resp 20  SpO2 100 %  O2 Device Room Air  Assess: MEWS Score  MEWS Temp 0  MEWS Systolic 0  MEWS Pulse 2  MEWS RR 0  MEWS LOC 0  MEWS Score 2  MEWS Score Color Yellow  Assess: SIRS CRITERIA  SIRS Temperature  0  SIRS Pulse 1  SIRS Respirations  0  SIRS WBC 0  SIRS Score Sum  1   Patient is a chronic yellow MEWS due to elevated HR.

## 2023-02-28 NOTE — H&P (View-Only) (Signed)
  Advanced Heart Failure Rounding Note  PCP-Cardiologist: None   Subjective:    Breathing improved. Reports less coughing today.  3.2L in UOP yesterday. Wt trending down but remains fluid overloaded. CMP pending.     Objective:   Weight Range: 120.6 kg Body mass index is 48.63 kg/m.   Vital Signs:   Temp:  [97.6 F (36.4 C)-98.2 F (36.8 C)] 97.6 F (36.4 C) (05/02 0711) Pulse Rate:  [92-109] 102 (05/02 0711) Resp:  [20] 20 (05/02 0711) BP: (105-143)/(66-92) 105/66 (05/02 0711) SpO2:  [91 %-99 %] 99 % (05/02 0711) Weight:  [120.6 kg] 120.6 kg (05/02 0415) Last BM Date : 02/26/23  Weight change: Filed Weights   02/26/23 1333 02/27/23 0016 02/28/23 0415  Weight: 122.2 kg 121.7 kg 120.6 kg    Intake/Output:   Intake/Output Summary (Last 24 hours) at 02/28/2023 0822 Last data filed at 02/28/2023 0400 Gross per 24 hour  Intake 480 ml  Output 3200 ml  Net -2720 ml      Physical Exam    General:  morbidly obese, No resp difficulty HEENT: Normal Neck: Supple. Thick neck, JVD not well visualized. Carotids 2+ bilat; no bruits. No lymphadenopathy or thyromegaly appreciated. Cor: PMI nondisplaced. Regular rhythm tachy rate. No rubs, gallops or murmurs. Lungs: decreased BS at the bases bilaterally  Abdomen: obese, soft, nontender, nondistended. No hepatosplenomegaly. No bruits or masses. Good bowel sounds. Extremities: No cyanosis, clubbing, rash, 2+ b/l LE edema Neuro: Alert & orientedx3, cranial nerves grossly intact. moves all 4 extremities w/o difficulty. Affect pleasant   Telemetry   Sinus tach low 100s   EKG    N/A   Labs    CBC Recent Labs    02/27/23 0100 02/28/23 0106  WBC 11.1* 11.4*  NEUTROABS 8.7* 8.8*  HGB 13.7 13.0  HCT 41.4 40.2  MCV 84.8 85.2  PLT 313 298   Basic Metabolic Panel Recent Labs    02/26/23 0106 02/27/23 0100  NA 139 136  K 4.2 4.4  CL 107 106  CO2 20* 21*  GLUCOSE 109* 115*  BUN 18 18  CREATININE 0.94 0.94   CALCIUM 9.1 9.2   Liver Function Tests Recent Labs    02/27/23 0100  AST 24  ALT 20  ALKPHOS 44  BILITOT 0.8  PROT 7.3  ALBUMIN 3.5   No results for input(s): "LIPASE", "AMYLASE" in the last 72 hours. Cardiac Enzymes No results for input(s): "CKTOTAL", "CKMB", "CKMBINDEX", "TROPONINI" in the last 72 hours.  BNP: BNP (last 3 results) Recent Labs    02/26/23 0106 02/27/23 0100 02/28/23 0106  BNP 919.3* 868.7* 432.8*    ProBNP (last 3 results) No results for input(s): "PROBNP" in the last 8760 hours.   D-Dimer No results for input(s): "DDIMER" in the last 72 hours. Hemoglobin A1C No results for input(s): "HGBA1C" in the last 72 hours. Fasting Lipid Panel No results for input(s): "CHOL", "HDL", "LDLCALC", "TRIG", "CHOLHDL", "LDLDIRECT" in the last 72 hours. Thyroid Function Tests No results for input(s): "TSH", "T4TOTAL", "T3FREE", "THYROIDAB" in the last 72 hours.  Invalid input(s): "FREET3"  Other results:   Imaging    DG CHEST PORT 1 VIEW  Result Date: 02/27/2023 CLINICAL DATA:  Shortness of breath. EXAM: PORTABLE CHEST 1 VIEW COMPARISON:  February 26, 2023. FINDINGS: Mild cardiomegaly is noted with central pulmonary vascular congestion. No consolidative process is noted. Bony thorax is unremarkable. IMPRESSION: Mild cardiomegaly with central pulmonary vascular congestion. Electronically Signed   By: James  Green Jr   M.D.   On: 02/27/2023 10:14     Medications:     Scheduled Medications:  enoxaparin (LOVENOX) injection  40 mg Subcutaneous Q24H   furosemide  80 mg Intravenous BID   guaiFENesin  1,200 mg Oral BID   ipratropium  0.5 mg Nebulization BID   levalbuterol  0.63 mg Nebulization BID   losartan  12.5 mg Oral Daily   montelukast  10 mg Oral QHS   predniSONE  60 mg Oral Daily   spironolactone  12.5 mg Oral Daily    Infusions:   PRN Medications: acetaminophen **OR** acetaminophen, ipratropium, levalbuterol    Patient Profile   47 y/o AAF w/  h/o asthma and obesity admitted w/ new systolic heart failure.   Assessment/Plan   1. Acute Systolic Heart Failure - new diagnosis - Echo EF 20-25% (looks closer to 30-35%), RV hyperdynamic  - Etiology uncertain. BPs have been normotensive. Hs trops negative  - remains volume up. Continue IV Lasix 80 mg bid  - plan R/LHC tomorrow   - cMRI if cath unrevealing  - increase spiro to 25 mg daily  - continue losartan 12.5 mg daily   - continue digoxin 0.125 mg daily  - no ? blocker yet w/ acute decompensation. If added in future, would consider bisoprolol given asthma.     2. Acute Asthma Exacerbation  - per IM    3. Morbid obesity - Body mass index is 49.07 kg/m. - consider GLP1RA   Length of Stay: 2  Carolyn Simmons, PA-C  02/28/2023, 8:22 AM  Advanced Heart Failure Team Pager [phone] (M-F; 7a - 5p)  Please contact [facility] for night-coverage after hours (5p -7a ) and weekends on [email or web address]  Patient seen and examined with the above-signed Advanced Practice Provider and/or Housestaff. I personally reviewed laboratory data, imaging studies and relevant notes. I independently examined the patient and formulated the important aspects of the plan. I have edited the note to reflect any of my changes or salient points. I have personally discussed the plan with the patient and/or family.  Diuresing well with IV diuresis. Breathing better. No orthopnea or PND.   General:  Obese. Sitting up on side of bed. No resp difficulty HEENT: normal Neck: supple. Unable to see JVP Carotids 2+ bilat; no bruits. No lymphadenopathy or thryomegaly appreciated. Cor: PMI nondisplaced. Regular rate & rhythm. No rubs, gallops or murmurs. Lungs: clear Abdomen: obese soft, nontender, nondistended. No hepatosplenomegaly. No bruits or masses. Good bowel sounds. Extremities: no cyanosis, clubbing, rash, edema Neuro: alert & orientedx3, cranial nerves grossly intact. moves all 4 extremities w/o  difficulty. Affect pleasant  Diuresing well. Continue to titrate GDMT. Plan R/L cath tomorrow +/- cMRI.  Carolyn Basnett, MD  6:34 PM    

## 2023-02-28 NOTE — Progress Notes (Signed)
PROGRESS NOTE    Carolyn Oconnor  ZOX:[id number] DOB: 1976/01/20 DOA: 02/26/2023 PCP: System, Provider Not In   Brief Narrative:  The patient Carolyn Oconnor is a 47 y.o. female with medical history significant of asthma, allergic rhinitis, chronic bronchitis, obesity presented to ED with complaints of shortness of breath, cough, wheezing, and bilateral lower extremity edema.  Patient got admitted for suspected CHF exacerbation, asthma exacerbation.  He has been initiated on diuresis with breathing treatments patient is reporting some improvement.  Although she is on room air patient still has conversational dyspnea and tachycardia even at rest.  Further workup reveals acute systolic CHF and cardiology consulted and planning for right and left heart cath on Friday followed by a cardiac MRI.  Cardiology is now diuresing her given her volume overload  Assessment and Plan:  Acute Systolic CHF -Patient with no documented history of CHF or prior echo results in the chart presenting with 1 month history of progressively worsening dyspnea, orthopnea, bilateral lower extremity edema, and weight gain.   -BNP went from 919.3 -> 868.7 -> 432.8 -Chest x-ray showing central vascular congestion, BNP 919 -Continue IV diuresis with Lasix and had increased from IV 40 to twice daily dosing but cardiology had increased it further to IV 80 twice daily and recommending continuing it today  Intake/Output Summary (Last 24 hours) at 02/28/2023 1055 Last data filed at 02/28/2023 0900 Gross per 24 hour  Intake 340 ml  Output 2950 ml  Net -2610 ml   -Echocardiogram ordered and showed "Left ventricular ejection fraction, by best estimation, is 20 to 25%. The left ventricle has severely decreased function. Left ventricular  endocardial border not optimally defined to evaluate regional wall motion despite contrast administration. The left ventricular internal cavity size was mildly to moderately dilated. Left ventricular  diastolic parameters are indeterminate. Right ventricular systolic function is hyperdynamic. The right ventricular size is normal. Tricuspid regurgitation signal is inadequate for assessing PA pressure. The mitral valve is normal in structure. Mild to moderate mitral valve regurgitation. No evidence of mitral stenosis. The aortic valve was not well visualized. Aortic valve regurgitation is not visualized. No aortic stenosis is present." -Fluid and salt restriction to be continued at 1200 mL -Patient still clearly is symptomatic with conversational dyspnea although she is not oxygen dependent and is on room air. -Given new onset CHF we have consulted Cardiology for further evaluation recommendations -Heart failure team has been consulted and they are recommending a right and left heart cath tomorrow as well as a cardiac MRI if the cath is unrevealing -They started the patient on spironolactone 12.5 mg p.o. daily, losartan 12.5 mg p.o. daily given that her blood pressure is too soft for Entresto at this time and also started digoxin 0.125 mg p.o. daily; Now Cardiology has increased Spironolactone to 25 mg po Daily -Currently the patient has not been started on a beta-blocker given her acute decompensation but if it is added in the future they are going to consider bisoprolol given the patient's asthma -UDS negative  -Cardiology consulted and planning a right and left heart cath tomorrow and they are recommending a cardiac MRI if the cath is unrevealing   Acute asthma exacerbation -Overall wheezing has improved, however still has clearly conversational dyspnea and tachycardia -Continue prednisone 60, DuoNebs every 6 hours and Singulair -SpO2: 100 % O2 Flow Rate (L/min): 2 L/min; Currently patient is on room air -Continue supplemental oxygen via nasal cannula wean O2 as tolerated Continuous pulse oximetry maintain  O2 saturations greater than 90% -Will add guaifenesin 1200 mg p.o. twice daily flutter  valve, incentive spirometry -Respiratory virus panel by PCR was negative for influenza A and B as well as respiratory syncytial virus and SARS-CoV-2 -Have added Xopenex and Atrovent twice daily as well as every 6 as needed for wheezing and shortness of breath -Patient will need ambulatory home O2 screen prior to discharge as well as repeat chest x-ray -Repeat CXR this AM done and showed "Cardiomegaly and vascular congestion with improved edema from 2 days ago. No acute cardiopulmonary process."   Leukocytosis -WBC Trend: -In the setting of Steroid Demargination Recent Labs  Lab 02/26/23 0106 02/27/23 0100 02/28/23 0106  WBC 9.0 11.1* 11.4*  -Continue to Monitor and Trend and repeat CBC in the AM   Metabolic Acidosis -Mild and improved. CO2 was 21, AG was 9, Chloride Level was 106 and now improved as CO2 is 22, anion gap is 10, chloride level is 104 -Continue to Monitor and Trend and repeat CMP in the AM   Hypoalbuminemia -Patient's Albumin Trend: Recent Labs  Lab 02/27/23 0100 02/28/23 0944  ALBUMIN 3.5 3.4*  -Continue to Monitor and Trend and repeat CMP in the AM  Morbid Obesity -Complicates overall prognosis and care -Estimated body mass index is 48.63 kg/m as calculated from the following:   Height as of this encounter: 5\' 2"  (1.575 m).   Weight as of this encounter: 120.6 kg.  -Weight Loss and Dietary Counseling given  DVT prophylaxis: enoxaparin (LOVENOX) injection 40 mg Start: 02/26/23 1400    Code Status: Full Code Family Communication: No family currently at bedside  Disposition Plan:  Level of care: Telemetry Cardiac Status is: Inpatient Remains inpatient appropriate because: Needs further cardiac evaluation and clearance and plans for right left heart cath in the morning followed by cardiac MRI of cath is unrevealing   Consultants:  Cardiology  Procedures:  ECHOCARDIOGRAM IMPRESSIONS     1. Left ventricular ejection fraction, by best estimation, is  20 to 25%.  The left ventricle has severely decreased function. Left ventricular  endocardial border not optimally defined to evaluate regional wall motion  despite contrast administration. The  left ventricular internal cavity size was mildly to moderately dilated.  Left ventricular diastolic parameters are indeterminate.   2. Right ventricular systolic function is hyperdynamic. The right  ventricular size is normal. Tricuspid regurgitation signal is inadequate  for assessing PA pressure.   3. The mitral valve is normal in structure. Mild to moderate mitral valve  regurgitation. No evidence of mitral stenosis.   4. The aortic valve was not well visualized. Aortic valve regurgitation  is not visualized. No aortic stenosis is present.   Comparison(s): No prior Echocardiogram.   Conclusion(s)/Recommendation(s): Reaching out to primary team.   FINDINGS   Left Ventricle: Left ventricular ejection fraction, by estimation, is 20  to 25%. The left ventricle has severely decreased function. Left  ventricular endocardial border not optimally defined to evaluate regional  wall motion. Definity contrast agent was  given IV to delineate the left ventricular endocardial borders. The left  ventricular internal cavity size was mildly to moderately dilated.  Suboptimal image quality limits for assessment of left ventricular  hypertrophy. Left ventricular diastolic  parameters are indeterminate.   Right Ventricle: The right ventricular size is normal. No increase in  right ventricular wall thickness. Right ventricular systolic function is  hyperdynamic. Tricuspid regurgitation signal is inadequate for assessing  PA pressure.   Left Atrium: Left atrial size  was normal in size.   Right Atrium: Right atrial size was normal in size.   Pericardium: Trivial pericardial effusion is present. The pericardial  effusion is posterior to the left ventricle.   Mitral Valve: The mitral valve is normal in  structure. Mild to moderate  mitral valve regurgitation. No evidence of mitral valve stenosis.   Tricuspid Valve: The tricuspid valve is not well visualized. Tricuspid  valve regurgitation is not demonstrated. No evidence of tricuspid  stenosis.   Aortic Valve: The aortic valve was not well visualized. Aortic valve  regurgitation is not visualized. No aortic stenosis is present.   Pulmonic Valve: The pulmonic valve was normal in structure. Pulmonic valve  regurgitation is not visualized. No evidence of pulmonic stenosis.   Aorta: The aortic root is normal in size and structure.   IAS/Shunts: No atrial level shunt detected by color flow Doppler.     LEFT VENTRICLE  PLAX 2D  LVIDd:         5.70 cm  LVIDs:         5.50 cm  LV PW:         1.10 cm  LV IVS:        1.20 cm  LVOT diam:     2.10 cm  LVOT Area:     3.46 cm     RIGHT VENTRICLE  RV Basal diam:  3.60 cm  RV Mid diam:    2.80 cm  RV S prime:     14.50 cm/s  TAPSE (M-mode): 2.1 cm   LEFT ATRIUM             Index        RIGHT ATRIUM           Index  LA diam:        4.60 cm 2.24 cm/m   RA Area:     19.30 cm  LA Vol (A2C):   56.7 ml 27.60 ml/m  RA Volume:   60.70 ml  29.54 ml/m  LA Vol (A4C):   56.1 ml 27.30 ml/m  LA Biplane Vol: 63.5 ml 30.90 ml/m     SHUNTS  Systemic Diam: 2.10 cm   Antimicrobials:  Anti-infectives (From admission, onward)    None       Subjective: Seen and examined at bedside thinks her shortness of breath is getting little bit better.  Still has some dyspnea on conversation but not as much.  Continues remain volume overloaded and has lower extremity slight swelling but thinks she is getting better.  No nausea or vomiting.  Denies any chest pain or lightheadedness or dizziness and plans for cardiac cath on Friday she is understandable and agreeable to plan of care.  Objective: Vitals:   02/28/23 0711 02/28/23 0850 02/28/23 0949 02/28/23 0951  BP: 105/66 120/70    Pulse: (!) 102 (!)  110 (!) 112 (!) 109  Resp: 20 20    Temp: 97.6 F (36.4 C)     TempSrc: Oral     SpO2: 99% 100%    Weight:      Height:        Intake/Output Summary (Last 24 hours) at 02/28/2023 1052 Last data filed at 02/28/2023 0900 Gross per 24 hour  Intake 340 ml  Output 2950 ml  Net -2610 ml   Filed Weights   02/26/23 1333 02/27/23 0016 02/28/23 0415  Weight: 122.2 kg 121.7 kg 120.6 kg   Examination: Physical Exam:  Constitutional: WN/WD morbidly obese African-American female  in no acute distress appears calm Respiratory: Diminished to auscultation bilaterally with some coarse breath sounds and some slight crackles., no wheezing, rales, rhonchi. Normal respiratory effort and patient is not tachypenic. No accessory muscle use.  Unlabored breathing and not wearing supplemental oxygen nasal cannula Cardiovascular: RRR, no murmurs / rubs / gallops. S1 and S2 auscultated.  Has 1+ lower extremity pitting edema Abdomen: Soft, non-tender, distended secondary to body habitus. Bowel sounds positive.  GU: Deferred. Musculoskeletal: No clubbing / cyanosis of digits/nails. No joint deformity upper and lower extremities.  Skin: No rashes, lesions, ulcers on limited skin evaluation. No induration; Warm and dry.  Neurologic: CN 2-12 grossly intact with no focal deficits. Romberg sign and cerebellar reflexes not assessed.  Psychiatric: Normal judgment and insight. Alert and oriented x 3. Normal mood and appropriate affect.   Data Reviewed: I have personally reviewed following labs and imaging studies  CBC: Recent Labs  Lab 02/26/23 0106 02/27/23 0100 02/28/23 0106  WBC 9.0 11.1* 11.4*  NEUTROABS  --  8.7* 8.8*  HGB 13.4 13.7 13.0  HCT 40.1 41.4 40.2  MCV 85.3 84.8 85.2  PLT 277 313 298   Basic Metabolic Panel: Recent Labs  Lab 02/26/23 0106 02/27/23 0100 02/28/23 0944  NA 139 136 136  K 4.2 4.4 3.5  CL 107 106 104  CO2 20* 21* 22  GLUCOSE 109* 115* 106*  BUN 18 18 20   CREATININE 0.94  0.94 0.96  CALCIUM 9.1 9.2 8.8*  MG  --   --  2.3  PHOS  --   --  4.5   GFR: Estimated Creatinine Clearance: 91.5 mL/min (by C-G formula based on SCr of 0.96 mg/dL). Liver Function Tests: Recent Labs  Lab 02/27/23 0100 02/28/23 0944  AST 24 19  ALT 20 19  ALKPHOS 44 37*  BILITOT 0.8 0.8  PROT 7.3 6.4*  ALBUMIN 3.5 3.4*   No results for input(s): "LIPASE", "AMYLASE" in the last 168 hours. No results for input(s): "AMMONIA" in the last 168 hours. Coagulation Profile: No results for input(s): "INR", "PROTIME" in the last 168 hours. Cardiac Enzymes: No results for input(s): "CKTOTAL", "CKMB", "CKMBINDEX", "TROPONINI" in the last 168 hours. BNP (last 3 results) No results for input(s): "PROBNP" in the last 8760 hours. HbA1C: No results for input(s): "HGBA1C" in the last 72 hours. CBG: No results for input(s): "GLUCAP" in the last 168 hours. Lipid Profile: No results for input(s): "CHOL", "HDL", "LDLCALC", "TRIG", "CHOLHDL", "LDLDIRECT" in the last 72 hours. Thyroid Function Tests: No results for input(s): "TSH", "T4TOTAL", "FREET4", "T3FREE", "THYROIDAB" in the last 72 hours. Anemia Panel: No results for input(s): "VITAMINB12", "FOLATE", "FERRITIN", "TIBC", "IRON", "RETICCTPCT" in the last 72 hours. Sepsis Labs: No results for input(s): "PROCALCITON", "LATICACIDVEN" in the last 168 hours.  Recent Results (from the past 240 hour(s))  Resp panel by RT-PCR (RSV, Flu A&B, Covid) Anterior Nasal Swab     Status: None   Collection Time: 02/26/23  1:15 AM   Specimen: Anterior Nasal Swab  Result Value Ref Range Status   SARS Coronavirus 2 by RT PCR NEGATIVE NEGATIVE Final   Influenza A by PCR NEGATIVE NEGATIVE Final   Influenza B by PCR NEGATIVE NEGATIVE Final    Comment: (NOTE) The Xpert Xpress SARS-CoV-2/FLU/RSV plus assay is intended as an aid in the diagnosis of influenza from Nasopharyngeal swab specimens and should not be used as a sole basis for treatment. Nasal washings  and aspirates are unacceptable for Xpert Xpress SARS-CoV-2/FLU/RSV testing.  Fact  Sheet for Patients: BloggerCourse.com  Fact Sheet for Healthcare Providers: SeriousBroker.it  This test is not yet approved or cleared by the [place] FDA and has been authorized for detection and/or diagnosis of SARS-CoV-2 by FDA under an Emergency Use Authorization (EUA). This EUA will remain in effect (meaning this test can be used) for the duration of the COVID-19 declaration under Section 564(b)(1) of the Act, 21 U.S.C. section 360bbb-3(b)(1), unless the authorization is terminated or revoked.     Resp Syncytial Virus by PCR NEGATIVE NEGATIVE Final    Comment: (NOTE) Fact Sheet for Patients: BloggerCourse.com  Fact Sheet for Healthcare Providers: SeriousBroker.it  This test is not yet approved or cleared by the [place] FDA and has been authorized for detection and/or diagnosis of SARS-CoV-2 by FDA under an Emergency Use Authorization (EUA). This EUA will remain in effect (meaning this test can be used) for the duration of the COVID-19 declaration under Section 564(b)(1) of the Act, 21 U.S.C. section 360bbb-3(b)(1), unless the authorization is terminated or revoked.  Performed at [facility], [address] [place], [place] [address]   Respiratory (~20 pathogens) panel by PCR     Status: None   Collection Time: 02/27/23  1:51 PM   Specimen: Nasopharyngeal Swab; Respiratory  Result Value Ref Range Status   Adenovirus NOT DETECTED NOT DETECTED Final   Coronavirus 229E NOT DETECTED NOT DETECTED Final    Comment: (NOTE) The Coronavirus on the Respiratory Panel, DOES NOT test for the novel  Coronavirus (2019 nCoV)    Coronavirus HKU1 NOT DETECTED NOT DETECTED Final   Coronavirus NL63 NOT DETECTED NOT DETECTED Final   Coronavirus OC43 NOT DETECTED NOT DETECTED Final    Metapneumovirus NOT DETECTED NOT DETECTED Final   Rhinovirus / Enterovirus NOT DETECTED NOT DETECTED Final   Influenza A NOT DETECTED NOT DETECTED Final   Influenza B NOT DETECTED NOT DETECTED Final   Parainfluenza Virus 1 NOT DETECTED NOT DETECTED Final   Parainfluenza Virus 2 NOT DETECTED NOT DETECTED Final   Parainfluenza Virus 3 NOT DETECTED NOT DETECTED Final   Parainfluenza Virus 4 NOT DETECTED NOT DETECTED Final   Respiratory Syncytial Virus NOT DETECTED NOT DETECTED Final   Bordetella pertussis NOT DETECTED NOT DETECTED Final   Bordetella Parapertussis NOT DETECTED NOT DETECTED Final   Chlamydophila pneumoniae NOT DETECTED NOT DETECTED Final   Mycoplasma pneumoniae NOT DETECTED NOT DETECTED Final    Comment: Performed at [facility], [address] [place], [place] [address]    Radiology Studies: DG CHEST PORT 1 VIEW  Result Date: 02/28/2023 CLINICAL DATA:  Shortness of breath with cough.  History of asthma. EXAM: PORTABLE CHEST 1 VIEW COMPARISON:  Radiographs 02/27/2023, 02/26/2023 and 07/04/2016. FINDINGS: 0624 hours. Stable cardiomegaly and vascular congestion. Edema seen on the examination of 2 days ago has improved. There is no confluent airspace disease, pneumothorax or significant pleural effusion. The bones appear unremarkable. Telemetry leads overlie the chest. IMPRESSION: Cardiomegaly and vascular congestion with improved edema from 2 days ago. No acute cardiopulmonary process. Electronically Signed   By: Carey Bullocks M.D.   On: 02/28/2023 08:34   DG CHEST PORT 1 VIEW  Result Date: 02/27/2023 CLINICAL DATA:  Shortness of breath. EXAM: PORTABLE CHEST 1 VIEW COMPARISON:  February 26, 2023. FINDINGS: Mild cardiomegaly is noted with central pulmonary vascular congestion. No consolidative process is noted. Bony thorax is unremarkable. IMPRESSION: Mild cardiomegaly with central pulmonary vascular congestion. Electronically Signed   By: Lupita Raider  M.D.   On: 02/27/2023  10:14   ECHOCARDIOGRAM COMPLETE  Result Date: 02/26/2023    ECHOCARDIOGRAM REPORT   Patient Name:   Carolyn Oconnor Date of Exam: 02/26/2023 Medical Rec #:  [id number]       Height:       62.0 in Accession #:    [id number]      Weight:       237.0 lb Date of Birth:  1976/02/23      BSA:          2.055 m Patient Age:    46 years        BP:           125/85 mmHg Patient Gender: F               HR:           118 bpm. Exam Location:  Inpatient Procedure: 2D Echo, Cardiac Doppler, Color Doppler and Intracardiac            Opacification Agent Indications:    CHF  History:        Patient has no prior history of Echocardiogram examinations.                 CHF. Asthma.  Sonographer:    Milbert Coulter Referring Phys: [id number] John Giovanni  Sonographer Comments: Patient is obese. Image acquisition challenging due to patient body habitus. IMPRESSIONS  1. Left ventricular ejection fraction, by best estimation, is 20 to 25%. The left ventricle has severely decreased function. Left ventricular endocardial border not optimally defined to evaluate regional wall motion despite contrast administration. The left ventricular internal cavity size was mildly to moderately dilated. Left ventricular diastolic parameters are indeterminate.  2. Right ventricular systolic function is hyperdynamic. The right ventricular size is normal. Tricuspid regurgitation signal is inadequate for assessing PA pressure.  3. The mitral valve is normal in structure. Mild to moderate mitral valve regurgitation. No evidence of mitral stenosis.  4. The aortic valve was not well visualized. Aortic valve regurgitation is not visualized. No aortic stenosis is present. Comparison(s): No prior Echocardiogram. Conclusion(s)/Recommendation(s): Reaching out to primary team. FINDINGS  Left Ventricle: Left ventricular ejection fraction, by estimation, is 20 to 25%. The left ventricle has severely decreased function. Left ventricular endocardial border not optimally  defined to evaluate regional wall motion. Definity contrast agent was given IV to delineate the left ventricular endocardial borders. The left ventricular internal cavity size was mildly to moderately dilated. Suboptimal image quality limits for assessment of left ventricular hypertrophy. Left ventricular diastolic parameters are indeterminate. Right Ventricle: The right ventricular size is normal. No increase in right ventricular wall thickness. Right ventricular systolic function is hyperdynamic. Tricuspid regurgitation signal is inadequate for assessing PA pressure. Left Atrium: Left atrial size was normal in size. Right Atrium: Right atrial size was normal in size. Pericardium: Trivial pericardial effusion is present. The pericardial effusion is posterior to the left ventricle. Mitral Valve: The mitral valve is normal in structure. Mild to moderate mitral valve regurgitation. No evidence of mitral valve stenosis. Tricuspid Valve: The tricuspid valve is not well visualized. Tricuspid valve regurgitation is not demonstrated. No evidence of tricuspid stenosis. Aortic Valve: The aortic valve was not well visualized. Aortic valve regurgitation is not visualized. No aortic stenosis is present. Pulmonic Valve: The pulmonic valve was normal in structure. Pulmonic valve regurgitation is not visualized. No evidence of pulmonic stenosis. Aorta: The aortic root is normal in size and structure. IAS/Shunts: No  atrial level shunt detected by color flow Doppler.  LEFT VENTRICLE PLAX 2D LVIDd:         5.70 cm LVIDs:         5.50 cm LV PW:         1.10 cm LV IVS:        1.20 cm LVOT diam:     2.10 cm LVOT Area:     3.46 cm  RIGHT VENTRICLE RV Basal diam:  3.60 cm RV Mid diam:    2.80 cm RV S prime:     14.50 cm/s TAPSE (M-mode): 2.1 cm LEFT ATRIUM             Index        RIGHT ATRIUM           Index LA diam:        4.60 cm 2.24 cm/m   RA Area:     19.30 cm LA Vol (A2C):   56.7 ml 27.60 ml/m  RA Volume:   60.70 ml  29.54  ml/m LA Vol (A4C):   56.1 ml 27.30 ml/m LA Biplane Vol: 63.5 ml 30.90 ml/m   SHUNTS Systemic Diam: 2.10 cm Riley Lam MD Electronically signed by Riley Lam MD Signature Date/Time: 02/26/2023/3:25:57 PM    Final     Scheduled Meds:  digoxin  0.125 mg Oral Daily   enoxaparin (LOVENOX) injection  40 mg Subcutaneous Q24H   furosemide  80 mg Intravenous BID   guaiFENesin  1,200 mg Oral BID   ipratropium  0.5 mg Nebulization BID   levalbuterol  0.63 mg Nebulization BID   losartan  12.5 mg Oral Daily   montelukast  10 mg Oral QHS   predniSONE  60 mg Oral Daily   [START ON 03/01/2023] spironolactone  25 mg Oral Daily   Continuous Infusions:   LOS: 2 days   Carolyn Merles, DO [facility] Available via Epic secure chat 7am-7pm After these hours, please refer to coverage provider listed on [email or web address] 02/28/2023, 10:52 AM

## 2023-02-28 NOTE — Progress Notes (Signed)
Orthopedic Tech Progress Note Patient Details:  Carolyn Oconnor Jan 06, 1976 [id number]  Ortho tech service aware of unna boot order. RN Lafonda Mosses is understanding of the circumstances that we have to respond to all trauma pages, ED orders, and splinting requests etc first due to their higher acuity needs which can cause a delay in routine orders on the floors. Pt will receive unna boots today, 5/2.  Patient ID: Carolyn Oconnor, female   DOB: November 22, 1975, 47 y.o.   MRN: [id number]  Docia Furl 02/28/2023, 5:44 PM

## 2023-02-28 NOTE — Progress Notes (Addendum)
Advanced Heart Failure Rounding Note  PCP-Cardiologist: None   Subjective:    Breathing improved. Reports less coughing today.  3.2L in UOP yesterday. Wt trending down but remains fluid overloaded. CMP pending.     Objective:   Weight Range: 120.6 kg Body mass index is 48.63 kg/m.   Vital Signs:   Temp:  [97.6 F (36.4 C)-98.2 F (36.8 C)] 97.6 F (36.4 C) (05/02 0711) Pulse Rate:  [92-109] 102 (05/02 0711) Resp:  [20] 20 (05/02 0711) BP: (105-143)/(66-92) 105/66 (05/02 0711) SpO2:  [91 %-99 %] 99 % (05/02 0711) Weight:  [120.6 kg] 120.6 kg (05/02 0415) Last BM Date : 02/26/23  Weight change: Filed Weights   02/26/23 1333 02/27/23 0016 02/28/23 0415  Weight: 122.2 kg 121.7 kg 120.6 kg    Intake/Output:   Intake/Output Summary (Last 24 hours) at 02/28/2023 6962 Last data filed at 02/28/2023 0400 Gross per 24 hour  Intake 480 ml  Output 3200 ml  Net -2720 ml      Physical Exam    General:  morbidly obese, No resp difficulty HEENT: Normal Neck: Supple. Thick neck, JVD not well visualized. Carotids 2+ bilat; no bruits. No lymphadenopathy or thyromegaly appreciated. Cor: PMI nondisplaced. Regular rhythm tachy rate. No rubs, gallops or murmurs. Lungs: decreased BS at the bases bilaterally  Abdomen: obese, soft, nontender, nondistended. No hepatosplenomegaly. No bruits or masses. Good bowel sounds. Extremities: No cyanosis, clubbing, rash, 2+ b/l LE edema Neuro: Alert & orientedx3, cranial nerves grossly intact. moves all 4 extremities w/o difficulty. Affect pleasant   Telemetry   Sinus tach low 100s   EKG    N/A   Labs    CBC Recent Labs    02/27/23 0100 02/28/23 0106  WBC 11.1* 11.4*  NEUTROABS 8.7* 8.8*  HGB 13.7 13.0  HCT 41.4 40.2  MCV 84.8 85.2  PLT 313 298   Basic Metabolic Panel Recent Labs    95/28/41 0106 02/27/23 0100  NA 139 136  K 4.2 4.4  CL 107 106  CO2 20* 21*  GLUCOSE 109* 115*  BUN 18 18  CREATININE 0.94 0.94   CALCIUM 9.1 9.2   Liver Function Tests Recent Labs    02/27/23 0100  AST 24  ALT 20  ALKPHOS 44  BILITOT 0.8  PROT 7.3  ALBUMIN 3.5   No results for input(s): "LIPASE", "AMYLASE" in the last 72 hours. Cardiac Enzymes No results for input(s): "CKTOTAL", "CKMB", "CKMBINDEX", "TROPONINI" in the last 72 hours.  BNP: BNP (last 3 results) Recent Labs    02/26/23 0106 02/27/23 0100 02/28/23 0106  BNP 919.3* 868.7* 432.8*    ProBNP (last 3 results) No results for input(s): "PROBNP" in the last 8760 hours.   D-Dimer No results for input(s): "DDIMER" in the last 72 hours. Hemoglobin A1C No results for input(s): "HGBA1C" in the last 72 hours. Fasting Lipid Panel No results for input(s): "CHOL", "HDL", "LDLCALC", "TRIG", "CHOLHDL", "LDLDIRECT" in the last 72 hours. Thyroid Function Tests No results for input(s): "TSH", "T4TOTAL", "T3FREE", "THYROIDAB" in the last 72 hours.  Invalid input(s): "FREET3"  Other results:   Imaging    DG CHEST PORT 1 VIEW  Result Date: 02/27/2023 CLINICAL DATA:  Shortness of breath. EXAM: PORTABLE CHEST 1 VIEW COMPARISON:  February 26, 2023. FINDINGS: Mild cardiomegaly is noted with central pulmonary vascular congestion. No consolidative process is noted. Bony thorax is unremarkable. IMPRESSION: Mild cardiomegaly with central pulmonary vascular congestion. Electronically Signed   By: Lupita Raider  M.D.   On: 02/27/2023 10:14     Medications:     Scheduled Medications:  enoxaparin (LOVENOX) injection  40 mg Subcutaneous Q24H   furosemide  80 mg Intravenous BID   guaiFENesin  1,200 mg Oral BID   ipratropium  0.5 mg Nebulization BID   levalbuterol  0.63 mg Nebulization BID   losartan  12.5 mg Oral Daily   montelukast  10 mg Oral QHS   predniSONE  60 mg Oral Daily   spironolactone  12.5 mg Oral Daily    Infusions:   PRN Medications: acetaminophen **OR** acetaminophen, ipratropium, levalbuterol    Patient Profile   47 y/o AAF w/  h/o asthma and obesity admitted w/ new systolic heart failure.   Assessment/Plan   1. Acute Systolic Heart Failure - new diagnosis - Echo EF 20-25% (looks closer to 30-35%), RV hyperdynamic  - Etiology uncertain. BPs have been normotensive. Hs trops negative  - remains volume up. Continue IV Lasix 80 mg bid  - plan [facility] tomorrow   - cMRI if cath unrevealing  - increase spiro to 25 mg daily  - continue losartan 12.5 mg daily   - continue digoxin 0.125 mg daily  - no ? blocker yet w/ acute decompensation. If added in future, would consider bisoprolol given asthma.     2. Acute Asthma Exacerbation  - per IM    3. Morbid obesity - Body mass index is 49.07 kg/m. - consider GLP1RA   Length of Stay: 2  Robbie Lis, PA-C  02/28/2023, 8:22 AM  Advanced Heart Failure Team Pager [phone] (M-F; 7a - 5p)  Please contact [facility] for night-coverage after hours (5p -7a ) and weekends on [email or web address]  Patient seen and examined with the above-signed Advanced Practice Provider and/or Housestaff. I personally reviewed laboratory data, imaging studies and relevant notes. I independently examined the patient and formulated the important aspects of the plan. I have edited the note to reflect any of my changes or salient points. I have personally discussed the plan with the patient and/or family.  Diuresing well with IV diuresis. Breathing better. No orthopnea or PND.   General:  Obese. Sitting up on side of bed. No resp difficulty HEENT: normal Neck: supple. Unable to see JVP Carotids 2+ bilat; no bruits. No lymphadenopathy or thryomegaly appreciated. Cor: PMI nondisplaced. Regular rate & rhythm. No rubs, gallops or murmurs. Lungs: clear Abdomen: obese soft, nontender, nondistended. No hepatosplenomegaly. No bruits or masses. Good bowel sounds. Extremities: no cyanosis, clubbing, rash, edema Neuro: alert & orientedx3, cranial nerves grossly intact. moves all 4 extremities w/o  difficulty. Affect pleasant  Diuresing well. Continue to titrate GDMT. Plan R/L cath tomorrow +/- cMRI.  Arvilla Meres, MD  6:34 PM

## 2023-02-28 NOTE — Progress Notes (Signed)
Orthopedic Tech Progress Note Patient Details:  Carolyn Oconnor June 01, 1976 [id number]  Unna boots applied to BLE  Ortho Devices Type of Ortho Device: Radio broadcast assistant Ortho Device/Splint Location: BLE Ortho Device/Splint Interventions: Ordered, Application, Adjustment   Post Interventions Patient Tolerated: Well Instructions Provided: Care of device  Carolyn Oconnor 02/28/2023, 8:46 PM

## 2023-03-01 ENCOUNTER — Inpatient Hospital Stay (HOSPITAL_COMMUNITY): Payer: Self-pay

## 2023-03-01 ENCOUNTER — Encounter (HOSPITAL_COMMUNITY)
Admission: EM | Disposition: A | Discharge status: Home / Self Care | Payer: Self-pay | Source: Home / Self Care | Attending: Internal Medicine

## 2023-03-01 ENCOUNTER — Other Ambulatory Visit (HOSPITAL_COMMUNITY): Payer: Self-pay

## 2023-03-01 ENCOUNTER — Telehealth (HOSPITAL_COMMUNITY): Payer: Self-pay | Admitting: Pharmacy Technician

## 2023-03-01 HISTORY — PX: RIGHT/LEFT HEART CATH AND CORONARY ANGIOGRAPHY: CATH118266

## 2023-03-01 LAB — CBC WITH DIFFERENTIAL/PLATELET
Abs Immature Granulocytes: 0.06 10*3/uL (ref 0.00–0.07)
Basophils Absolute: 0 10*3/uL (ref 0.0–0.1)
Basophils Relative: 0 %
Eosinophils Absolute: 0 10*3/uL (ref 0.0–0.5)
Eosinophils Relative: 0 %
HCT: 46.7 % — ABNORMAL HIGH (ref 36.0–46.0)
Hemoglobin: 15.5 g/dL — ABNORMAL HIGH (ref 12.0–15.0)
Immature Granulocytes: 0 %
Lymphocytes Relative: 18 %
Lymphs Abs: 2.5 10*3/uL (ref 0.7–4.0)
MCH: 27.7 pg (ref 26.0–34.0)
MCHC: 33.2 g/dL (ref 30.0–36.0)
MCV: 83.5 fL (ref 80.0–100.0)
Monocytes Absolute: 1.3 10*3/uL — ABNORMAL HIGH (ref 0.1–1.0)
Monocytes Relative: 9 %
Neutro Abs: 10.1 10*3/uL — ABNORMAL HIGH (ref 1.7–7.7)
Neutrophils Relative %: 73 %
Platelets: 357 10*3/uL (ref 150–400)
RBC: 5.59 MIL/uL — ABNORMAL HIGH (ref 3.87–5.11)
RDW: 14.6 % (ref 11.5–15.5)
WBC: 13.9 10*3/uL — ABNORMAL HIGH (ref 4.0–10.5)
nRBC: 0 % (ref 0.0–0.2)

## 2023-03-01 LAB — POCT I-STAT EG7
Acid-Base Excess: 0 mmol/L (ref 0.0–2.0)
Acid-Base Excess: 1 mmol/L (ref 0.0–2.0)
Acid-Base Excess: 2 mmol/L (ref 0.0–2.0)
Bicarbonate: 22.8 mmol/L (ref 20.0–28.0)
Bicarbonate: 25.2 mmol/L (ref 20.0–28.0)
Bicarbonate: 25.6 mmol/L (ref 20.0–28.0)
Calcium, Ion: 1.06 mmol/L — ABNORMAL LOW (ref 1.15–1.40)
Calcium, Ion: 1.16 mmol/L (ref 1.15–1.40)
Calcium, Ion: 1.2 mmol/L (ref 1.15–1.40)
HCT: 44 % (ref 36.0–46.0)
HCT: 46 % (ref 36.0–46.0)
HCT: 47 % — ABNORMAL HIGH (ref 36.0–46.0)
Hemoglobin: 15 g/dL (ref 12.0–15.0)
Hemoglobin: 15.6 g/dL — ABNORMAL HIGH (ref 12.0–15.0)
Hemoglobin: 16 g/dL — ABNORMAL HIGH (ref 12.0–15.0)
O2 Saturation: 72 %
O2 Saturation: 73 %
O2 Saturation: 95 %
Potassium: 3.8 mmol/L (ref 3.5–5.1)
Potassium: 4.2 mmol/L (ref 3.5–5.1)
Potassium: 4.3 mmol/L (ref 3.5–5.1)
Sodium: 138 mmol/L (ref 135–145)
Sodium: 139 mmol/L (ref 135–145)
Sodium: 140 mmol/L (ref 135–145)
TCO2: 24 mmol/L (ref 22–32)
TCO2: 26 mmol/L (ref 22–32)
TCO2: 27 mmol/L (ref 22–32)
pCO2, Ven: 31.9 mmHg — ABNORMAL LOW (ref 44–60)
pCO2, Ven: 36.9 mmHg — ABNORMAL LOW (ref 44–60)
pCO2, Ven: 36.9 mmHg — ABNORMAL LOW (ref 44–60)
pH, Ven: 7.441 — ABNORMAL HIGH (ref 7.25–7.43)
pH, Ven: 7.449 — ABNORMAL HIGH (ref 7.25–7.43)
pH, Ven: 7.462 — ABNORMAL HIGH (ref 7.25–7.43)
pO2, Ven: 36 mmHg (ref 32–45)
pO2, Ven: 37 mmHg (ref 32–45)
pO2, Ven: 68 mmHg — ABNORMAL HIGH (ref 32–45)

## 2023-03-01 LAB — MAGNESIUM: Magnesium: 2.5 mg/dL — ABNORMAL HIGH (ref 1.7–2.4)

## 2023-03-01 LAB — BRAIN NATRIURETIC PEPTIDE: B Natriuretic Peptide: 421 pg/mL — ABNORMAL HIGH (ref 0.0–100.0)

## 2023-03-01 LAB — COMPREHENSIVE METABOLIC PANEL
ALT: 24 U/L (ref 0–44)
AST: 24 U/L (ref 15–41)
Albumin: 4 g/dL (ref 3.5–5.0)
Alkaline Phosphatase: 43 U/L (ref 38–126)
Anion gap: 11 (ref 5–15)
BUN: 23 mg/dL — ABNORMAL HIGH (ref 6–20)
CO2: 22 mmol/L (ref 22–32)
Calcium: 9.6 mg/dL (ref 8.9–10.3)
Chloride: 103 mmol/L (ref 98–111)
Creatinine, Ser: 1.08 mg/dL — ABNORMAL HIGH (ref 0.44–1.00)
GFR, Estimated: >60 mL/min (ref >60)
Glucose, Bld: 97 mg/dL (ref 70–99)
Potassium: 4.7 mmol/L (ref 3.5–5.1)
Sodium: 136 mmol/L (ref 135–145)
Total Bilirubin: 0.8 mg/dL (ref 0.3–1.2)
Total Protein: 7.6 g/dL (ref 6.5–8.1)

## 2023-03-01 LAB — RENAL FUNCTION PANEL
Albumin: 3.8 g/dL (ref 3.5–5.0)
Anion gap: 12 (ref 5–15)
BUN: 19 mg/dL (ref 6–20)
CO2: 23 mmol/L (ref 22–32)
Calcium: 9.4 mg/dL (ref 8.9–10.3)
Chloride: 102 mmol/L (ref 98–111)
Creatinine, Ser: 1.04 mg/dL — ABNORMAL HIGH (ref 0.44–1.00)
GFR, Estimated: >60 mL/min (ref >60)
Glucose, Bld: 108 mg/dL — ABNORMAL HIGH (ref 70–99)
Phosphorus: 4.4 mg/dL (ref 2.5–4.6)
Potassium: 3.8 mmol/L (ref 3.5–5.1)
Sodium: 137 mmol/L (ref 135–145)

## 2023-03-01 LAB — PHOSPHORUS: Phosphorus: 4.9 mg/dL — ABNORMAL HIGH (ref 2.5–4.6)

## 2023-03-01 LAB — PREGNANCY, URINE: Preg Test, Ur: NEGATIVE

## 2023-03-01 LAB — TSH: TSH: 2.339 u[IU]/mL (ref 0.350–4.500)

## 2023-03-01 SURGERY — RIGHT/LEFT HEART CATH AND CORONARY ANGIOGRAPHY
Anesthesia: LOCAL

## 2023-03-01 MED ORDER — FUROSEMIDE 10 MG/ML IJ SOLN
80.0000 mg | Freq: Once | INTRAMUSCULAR | Status: AC
  Administered 2023-03-01: 80 mg via INTRAVENOUS
  Filled 2023-03-01: qty 8

## 2023-03-01 MED ORDER — SODIUM CHLORIDE 0.9 % IV SOLN: INTRAVENOUS | Status: DC

## 2023-03-01 MED ORDER — FENTANYL CITRATE (PF) 100 MCG/2ML IJ SOLN
INTRAMUSCULAR | Status: AC
  Filled 2023-03-01: qty 2

## 2023-03-01 MED ORDER — FUROSEMIDE 10 MG/ML IJ SOLN
12.0000 mg/h | INTRAVENOUS | Status: DC
  Administered 2023-03-01 (×2): 12 mg/h via INTRAVENOUS
  Filled 2023-03-01 (×3): qty 20

## 2023-03-01 MED ORDER — LIDOCAINE HCL (PF) 1 % IJ SOLN
INTRAMUSCULAR | Status: AC
  Filled 2023-03-01: qty 30

## 2023-03-01 MED ORDER — MIDAZOLAM HCL 2 MG/2ML IJ SOLN
INTRAMUSCULAR | Status: DC | PRN
  Administered 2023-03-01: 2 mg via INTRAVENOUS

## 2023-03-01 MED ORDER — VERAPAMIL HCL 2.5 MG/ML IV SOLN
INTRAVENOUS | Status: AC
  Filled 2023-03-01: qty 2

## 2023-03-01 MED ORDER — HEPARIN SODIUM (PORCINE) 1000 UNIT/ML IJ SOLN
INTRAMUSCULAR | Status: DC | PRN
  Administered 2023-03-01: 5000 [IU] via INTRAVENOUS

## 2023-03-01 MED ORDER — METOLAZONE 2.5 MG PO TABS
2.5000 mg | ORAL_TABLET | Freq: Once | ORAL | Status: AC
  Administered 2023-03-01: 2.5 mg via ORAL
  Filled 2023-03-01: qty 1

## 2023-03-01 MED ORDER — IOHEXOL 350 MG/ML SOLN
INTRAVENOUS | Status: DC | PRN
  Administered 2023-03-01: 40 mL

## 2023-03-01 MED ORDER — HYDRALAZINE HCL 20 MG/ML IJ SOLN: 10.0000 mg | INTRAMUSCULAR | Status: AC | PRN

## 2023-03-01 MED ORDER — FENTANYL CITRATE (PF) 100 MCG/2ML IJ SOLN
INTRAMUSCULAR | Status: DC | PRN
  Administered 2023-03-01: 25 ug via INTRAVENOUS

## 2023-03-01 MED ORDER — ENOXAPARIN SODIUM 40 MG/0.4ML IJ SOSY
40.0000 mg | PREFILLED_SYRINGE | INTRAMUSCULAR | Status: DC
  Administered 2023-03-02 – 2023-03-04 (×3): 40 mg via SUBCUTANEOUS
  Filled 2023-03-01 (×3): qty 0.4

## 2023-03-01 MED ORDER — POTASSIUM CHLORIDE CRYS ER 20 MEQ PO TBCR
40.0000 meq | EXTENDED_RELEASE_TABLET | Freq: Two times a day (BID) | ORAL | Status: AC
  Administered 2023-03-01 (×2): 40 meq via ORAL
  Filled 2023-03-01 (×2): qty 2

## 2023-03-01 MED ORDER — IVABRADINE 2.5 MG HALF TABLET
2.5000 mg | ORAL_TABLET | Freq: Two times a day (BID) | ORAL | Status: DC
  Administered 2023-03-01 – 2023-03-04 (×5): 2.5 mg via ORAL
  Filled 2023-03-01 (×8): qty 1

## 2023-03-01 MED ORDER — METOLAZONE 5 MG PO TABS: 5.0000 mg | ORAL_TABLET | Freq: Once | ORAL | Status: DC

## 2023-03-01 MED ORDER — SODIUM CHLORIDE 0.9% FLUSH
3.0000 mL | Freq: Two times a day (BID) | INTRAVENOUS | Status: DC
  Administered 2023-03-01 – 2023-03-03 (×5): 3 mL via INTRAVENOUS

## 2023-03-01 MED ORDER — VERAPAMIL HCL 2.5 MG/ML IV SOLN
INTRAVENOUS | Status: DC | PRN
  Administered 2023-03-01: 10 mL via INTRA_ARTERIAL

## 2023-03-01 MED ORDER — SODIUM CHLORIDE 0.9 % IV SOLN: 250.0000 mL | INTRAVENOUS | Status: DC | PRN

## 2023-03-01 MED ORDER — METHOCARBAMOL 500 MG PO TABS
500.0000 mg | ORAL_TABLET | Freq: Four times a day (QID) | ORAL | Status: DC | PRN
  Administered 2023-03-01 – 2023-03-03 (×5): 500 mg via ORAL
  Filled 2023-03-01 (×5): qty 1

## 2023-03-01 MED ORDER — LIDOCAINE HCL (PF) 1 % IJ SOLN
INTRAMUSCULAR | Status: DC | PRN
  Administered 2023-03-01 (×2): 2 mL

## 2023-03-01 MED ORDER — MIDAZOLAM HCL 2 MG/2ML IJ SOLN
INTRAMUSCULAR | Status: AC
  Filled 2023-03-01: qty 2

## 2023-03-01 MED ORDER — SODIUM CHLORIDE 0.9% FLUSH: 3.0000 mL | INTRAVENOUS | Status: DC | PRN

## 2023-03-01 MED ORDER — HEPARIN (PORCINE) IN NACL 1000-0.9 UT/500ML-% IV SOLN
INTRAVENOUS | Status: DC | PRN
  Administered 2023-03-01 (×2): 500 mL

## 2023-03-01 SURGICAL SUPPLY — 12 items
CATH 5FR JL3.5 JR4 ANG PIG MP (CATHETERS) IMPLANT
CATH BALLN WEDGE 5F 110CM (CATHETERS) IMPLANT
CATH SWAN GANZ 7F STRAIGHT (CATHETERS) IMPLANT
DEVICE RAD COMP TR BAND LRG (VASCULAR PRODUCTS) IMPLANT
GLIDESHEATH SLEND SS 6F .021 (SHEATH) IMPLANT
GLIDESHEATH SLENDER 7FR .021G (SHEATH) IMPLANT
GUIDEWIRE INQWIRE 1.5J.035X260 (WIRE) IMPLANT
INQWIRE 1.5J .035X260CM (WIRE) ×1
PACK CARDIAC CATHETERIZATION (CUSTOM PROCEDURE TRAY) IMPLANT
SHEATH GLIDE SLENDER 4/5FR (SHEATH) IMPLANT
SHEATH PROBE COVER 6X72 (BAG) IMPLANT
TRANSDUCER W/STOPCOCK (MISCELLANEOUS) IMPLANT

## 2023-03-01 NOTE — Interval H&P Note (Signed)
History and Physical Interval Note:  03/01/2023 8:17 AM  Carolyn Oconnor  has presented today for surgery, with the diagnosis of HF.  The various methods of treatment have been discussed with the patient and family. After consideration of risks, benefits and other options for treatment, the patient has consented to  Procedure(s): RIGHT/LEFT HEART CATH AND CORONARY ANGIOGRAPHY (N/A) as a surgical intervention.  The patient's history has been reviewed, patient examined, no change in status, stable for surgery.  I have reviewed the patient's chart and labs.  Questions were answered to the patient's satisfaction.     [name]

## 2023-03-01 NOTE — Telephone Encounter (Signed)
Pharmacy Patient Advocate Encounter  Insurance verification completed.    The patient is not insured at this time.  The patient is currently admitted and ran test claims for the following: Corlanor.  Copays and coinsurance results were relayed to Inpatient clinical team.

## 2023-03-01 NOTE — TOC Initial Note (Addendum)
Transition of Care [facility]) - Initial/Assessment Note    Patient Details  Name: Carolyn Oconnor MRN: [id number] Date of Birth: April 17, 1976  [facility]) CM/SW Contact:    Elliot Cousin, RN Phone Number: [phone] 03/01/2023, 5:36 PM  Clinical Narrative:  HF TOC CM spoke to pt, boyfriend and father at bedside. SO take her to appts. Provided pt with scale and bag to carry medications. Pt has HF Booklet at bedside. Reviewed with patient daily weight, and low sodium diet. Limiting or avoiding fried fatty foods, and sugar. Pt wants to lose weight. SO states he will work with her on exercising and eating healthier at home. Will use HF funds for medications if dc during week. Will use MATCH if dc on weekend. Pt has follow up Orpah Clinton NP at [facility] on 5/10 at 1100.                Sent pt to [employer] and disability screening.   Expected Discharge Plan: Home/Self Care Barriers to Discharge: Continued Medical Work up   Patient Goals and CMS Choice Patient states their goals for this hospitalization and ongoing recovery are:: wants to remain independent          Expected Discharge Plan and Services   Discharge Planning Services: CM Consult   Living arrangements for the past 2 months: Single Family Home                                      Prior Living Arrangements/Services Living arrangements for the past 2 months: Single Family Home Lives with:: Significant Other Patient language and need for interpreter reviewed:: Yes Do you feel safe going back to the place where you live?: Yes      Need for Family Participation in Patient Care: No (Comment) Care giver support system in place?: Yes (comment)   Criminal Activity/Legal Involvement Pertinent to Current Situation/Hospitalization: No - Comment as needed  Activities of Daily Living Home Assistive Devices/Equipment: Nebulizer ADL Screening (condition at time of  admission) Patient's cognitive ability adequate to safely complete daily activities?: Yes Is the patient deaf or have difficulty hearing?: No Does the patient have difficulty seeing, even when wearing glasses/contacts?: No Does the patient have difficulty concentrating, remembering, or making decisions?: No Patient able to express need for assistance with ADLs?: Yes Does the patient have difficulty dressing or bathing?: No Independently performs ADLs?: Yes (appropriate for developmental age) Does the patient have difficulty walking or climbing stairs?: No Weakness of Legs: None Weakness of Arms/Hands: None  Permission Sought/Granted Permission sought to share information with : Case Manager, Family Supports, PCP Permission granted to share information with : Yes, Verbal Permission Granted  Share Information with NAME: Brene Sessions     Permission granted to share info w Relationship: sister  Permission granted to share info w Contact Information: [phone]  Emotional Assessment Appearance:: Appears stated age Attitude/Demeanor/Rapport: Engaged Affect (typically observed): Accepting Orientation: : Oriented to Self, Oriented to Place, Oriented to  Time, Oriented to Situation   Psych Involvement: No (comment)  Admission diagnosis:  Acute CHF (congestive heart failure) (HCC) [I50.9] Acute systolic congestive heart failure (HCC) [I50.21] Patient Active Problem List   Diagnosis Date Noted   Acute systolic congestive heart failure (HCC) 02/27/2023   Acute asthma exacerbation 02/26/2023   Severe persistent asthma 03/09/2016   Chronic bronchitis (HCC) 03/09/2016  Allergic rhinitis 03/09/2016   PCP:  System, Provider Not In Pharmacy:   [facility] [address] ([address]), [place] - [address] [place] [address] Phone: [phone] Fax: [phone]  [facility] [address] [place] [address] Phone:  [phone] Fax: [phone]     Social Determinants of Health (SDOH) Social History: SDOH Screenings   Food Insecurity: No Food Insecurity (02/26/2023)  Housing: Low Risk  (02/27/2023)  Transportation Needs: No Transportation Needs (02/27/2023)  Utilities: Not At Risk (02/26/2023)  Alcohol Screen: Low Risk  (02/27/2023)  Financial Resource Strain: High Risk (02/27/2023)  Tobacco Use: Medium Risk (02/27/2023)   SDOH Interventions: Housing Interventions: Intervention Not Indicated Transportation Interventions: Intervention Not Indicated Alcohol Usage Interventions: Intervention Not Indicated (Score <7) Financial Strain Interventions: Other (Comment)   Readmission Risk Interventions     No data to display

## 2023-03-01 NOTE — Progress Notes (Addendum)
Markedly elevated filling pressures on RHC this am. Will give 80 mg lasix IV and start lasix gtt at 12/hr. Depending on how she diureses today, will give dose of metolazone this afternoon.  Remains tachycardic. Start corlanor 2.5 mg BID

## 2023-03-01 NOTE — Progress Notes (Signed)
Advanced Heart Failure Rounding Note  PCP-Cardiologist: None   Subjective:    Diuresing well. Breathing improved. Still coughing. No orthopnea or PND. Remains tachycardic   Objective:   Weight Range: 118.4 kg Body mass index is 47.76 kg/m.   Vital Signs:   Temp:  [98 F (36.7 C)-98.6 F (37 C)] 98 F (36.7 C) (05/03 0714) Pulse Rate:  [0-140] 126 (05/03 0857) Resp:  [12-37] 30 (05/03 0857) BP: (107-140)/(73-106) 128/85 (05/03 0857) SpO2:  [91 %-99 %] 95 % (05/03 0857) Weight:  [118.4 kg] 118.4 kg (05/03 0348) Last BM Date : 02/28/23  Weight change: Filed Weights   02/27/23 0016 02/28/23 0415 03/01/23 0348  Weight: 121.7 kg 120.6 kg 118.4 kg    Intake/Output:   Intake/Output Summary (Last 24 hours) at 03/01/2023 0909 Last data filed at 03/01/2023 0741 Gross per 24 hour  Intake 131.26 ml  Output 3850 ml  Net -3718.74 ml       Physical Exam    General:  morbidly obese, No resp difficulty HEENT: normal Neck: supple. Neck thick. Hard to see jvp Carotids 2+ bilat; no bruits. No lymphadenopathy or thryomegaly appreciated. Cor: Regular tachy Lungs: clear Abdomen: obese soft, nontender, nondistended. No hepatosplenomegaly. No bruits or masses. Good bowel sounds. Extremities: no cyanosis, clubbing, rash, 1+ edema Neuro: alert & orientedx3, cranial nerves grossly intact. moves all 4 extremities w/o difficulty. Affect pleasant   Telemetry   Sinus tach 110-120s Personally reviewed   Labs    CBC Recent Labs    02/28/23 0106 03/01/23 0051  WBC 11.4* 13.9*  NEUTROABS 8.8* 10.1*  HGB 13.0 15.5*  HCT 40.2 46.7*  MCV 85.2 83.5  PLT 298 357    Basic Metabolic Panel Recent Labs    16/10/96 0944 03/01/23 0051  NA 136 136  K 3.5 4.7  CL 104 103  CO2 22 22  GLUCOSE 106* 97  BUN 20 23*  CREATININE 0.96 1.08*  CALCIUM 8.8* 9.6  MG 2.3 2.5*  PHOS 4.5 4.9*    Liver Function Tests Recent Labs    02/28/23 0944 03/01/23 0051  AST 19 24  ALT 19 24   ALKPHOS 37* 43  BILITOT 0.8 0.8  PROT 6.4* 7.6  ALBUMIN 3.4* 4.0    No results for input(s): "LIPASE", "AMYLASE" in the last 72 hours. Cardiac Enzymes No results for input(s): "CKTOTAL", "CKMB", "CKMBINDEX", "TROPONINI" in the last 72 hours.  BNP: BNP (last 3 results) Recent Labs    02/27/23 0100 02/28/23 0106 03/01/23 0051  BNP 868.7* 432.8* 421.0*     ProBNP (last 3 results) No results for input(s): "PROBNP" in the last 8760 hours.   D-Dimer No results for input(s): "DDIMER" in the last 72 hours. Hemoglobin A1C No results for input(s): "HGBA1C" in the last 72 hours. Fasting Lipid Panel No results for input(s): "CHOL", "HDL", "LDLCALC", "TRIG", "CHOLHDL", "LDLDIRECT" in the last 72 hours. Thyroid Function Tests No results for input(s): "TSH", "T4TOTAL", "T3FREE", "THYROIDAB" in the last 72 hours.  Invalid input(s): "FREET3"  Other results:   Imaging    DG CHEST PORT 1 VIEW  Result Date: 03/01/2023 CLINICAL DATA:  045409 SOB (shortness of breath) on exertion [id number] EXAM: PORTABLE CHEST 1 VIEW COMPARISON:  Radiograph 02/28/2023 FINDINGS: Unchanged enlarged cardiac silhouette. There are moderate social opacities, similar to prior exam. No new airspace disease. No large effusion or evidence of pneumothorax. Bones are unchanged. IMPRESSION: Unchanged cardiomegaly with pulmonary vascular congestion/interstitial edema. No new airspace disease. Electronically Signed  By: Caprice Renshaw M.D.   On: 03/01/2023 08:50     Medications:     Scheduled Medications:  [MAR Hold] digoxin  0.125 mg Oral Daily   [MAR Hold] enoxaparin (LOVENOX) injection  40 mg Subcutaneous Q24H   [MAR Hold] furosemide  80 mg Intravenous BID   [MAR Hold] guaiFENesin  1,200 mg Oral BID   [MAR Hold] ipratropium  0.5 mg Nebulization BID   [MAR Hold] levalbuterol  0.63 mg Nebulization BID   [MAR Hold] losartan  12.5 mg Oral Daily   [MAR Hold] montelukast  10 mg Oral QHS   [MAR Hold] predniSONE  60 mg  Oral Daily   [MAR Hold] sodium chloride flush  3 mL Intravenous Q12H   [MAR Hold] spironolactone  25 mg Oral Daily    Infusions:  sodium chloride 10 mL/hr at 03/01/23 0741    PRN Medications: [MAR Hold] acetaminophen **OR** [MAR Hold] acetaminophen, [MAR Hold] ipratropium, [MAR Hold] levalbuterol, sodium chloride flush    Patient Profile   47 y/o AAF w/ h/o asthma and obesity admitted w/ new systolic heart failure.   Assessment/Plan   1. Acute Systolic Heart Failure - new diagnosis - Echo EF 20-25% (looks closer to 30-35%), RV hyperdynamic  - Etiology uncertain. BPs have been normotensive. Hs trops negative  - Diuresing well but still volume overloaded and tachy.  - remains volume up. Continue IV Lasix 80 mg bid  - plan R/LHC today - cMRI if cath unrevealing  - continue spiro 25 mg daily  - continue losartan 12.5 mg daily   - continue digoxin 0.125 mg daily  - no ? blocker yet w/ acute decompensation. If added in future, would consider bisoprolol given asthma.     2. Acute Asthma Exacerbation  - per IM    3. Morbid obesity - Body mass index is Body mass index is 47.76 kg/m. - consider GLP1RA   Length of Stay: 3  Arvilla Meres, MD  03/01/2023, 9:09 AM  Advanced Heart Failure Team Pager [phone] (M-F; 7a - 5p)  Please contact [facility] for night-coverage after hours (5p -7a ) and weekends on [email or web address]

## 2023-03-01 NOTE — Progress Notes (Signed)
PROGRESS NOTE    Carolyn Oconnor  WUJ:[id number] DOB: 08-Feb-1976 DOA: 02/26/2023 PCP: System, Provider Not In   Brief Narrative:  The patient Carolyn Oconnor is a 47 y.o. female with medical history significant of asthma, allergic rhinitis, chronic bronchitis, obesity presented to ED with complaints of shortness of breath, cough, wheezing, and bilateral lower extremity edema.  Patient got admitted for suspected CHF exacerbation, asthma exacerbation.  He has been initiated on diuresis with breathing treatments patient is reporting some improvement.  Although she is on room air patient still has conversational dyspnea and tachycardia even at rest.  Further workup reveals acute systolic CHF and cardiology consulted and took the patient right and left heart cath today.    Cath was done and showed markedly elevated filling pressures on the right heart cath this morning and also showed normal coronaries however she is shown to have severe NICM EF ~20% along with marked volume overload and preserved cardiac output. Cardiology is now diuresing her given her volume overload and starting her on IV Lasix drip at 12 mg/h depending on how she diuresis today they may give her dose of metolazone this afternoon.  Cardiac team is starting her on Corlanor 2.5 mg p.o. twice daily and holding off from a cardiac MRI today.  Assessment and Plan:  Acute Systolic CHF Tachycardia -Patient with no documented history of CHF or prior echo results in the chart presenting with 1 month history of progressively worsening dyspnea, orthopnea, bilateral lower extremity edema, and weight gain.   -BNP went from 919.3 -> 868.7 -> 432.8 and is now 421.0 -Chest x-ray showing central vascular congestion, BNP 919 -Continue IV diuresis with Lasix and had increased from IV 40 to twice daily dosing but cardiology had increased it further to IV 80 twice daily and recommending continuing it today but now changing her to a Lasix drip on  03/01/2023  Intake/Output Summary (Last 24 hours) at 03/01/2023 1522 Last data filed at 03/01/2023 1500 Gross per 24 hour  Intake 22.28 ml  Output 3000 ml  Net -2977.72 ml     -Echocardiogram ordered and showed "Left ventricular ejection fraction, by best estimation, is 20 to 25%. The left ventricle has severely decreased function. Left ventricular  endocardial border not optimally defined to evaluate regional wall motion despite contrast administration. The left ventricular internal cavity size was mildly to moderately dilated. Left ventricular diastolic parameters are indeterminate. Right ventricular systolic function is hyperdynamic. The right ventricular size is normal. Tricuspid regurgitation signal is inadequate for assessing PA pressure. The mitral valve is normal in structure. Mild to moderate mitral valve regurgitation. No evidence of mitral stenosis. The aortic valve was not well visualized. Aortic valve regurgitation is not visualized. No aortic stenosis is present." -Fluid and salt restriction to be continued at 1200 mL -Patient still clearly is symptomatic with conversational dyspnea although she is not oxygen dependent and is on room air. -Given new onset CHF we have consulted Cardiology for further evaluation recommendations -Heart failure team has been consulted and they are recommending a right and left heart cath tomorrow as well as a cardiac MRI if the cath is unrevealing -They started the patient on spironolactone 12.5 mg p.o. daily, losartan 12.5 mg p.o. daily given that her blood pressure is too soft for Entresto at this time and also started digoxin 0.125 mg p.o. daily; Now Cardiology has increased Spironolactone to 25 mg po Daily -Currently the patient has not been started on a beta-blocker given her acute  decompensation but if it is added in the future they are going to consider bisoprolol given the patient's asthma -UDS negative  -TSH was 2.339 -Cardiology consulted and planning  a right and left heart cath tomorrow and it was done today and it showed severe nonischemic cardiomyopathy with an EF of around 20%, normal coronaries, marked volume overload but preserved cardiac output and markedly elevated filling pressures on right heart cath.  Cardiology is now initiating on Lasix drip and diuresing and starting her on possibly metolazone -Given her tachycardia cardiology started her on Corlanor 2.5 mL p.o. twice daily  Acute asthma exacerbation -Overall wheezing has improved, however still has clearly conversational dyspnea and tachycardia -Continue prednisone 60, DuoNebs every 6 hours and Singulair -SpO2: 98 % O2 Flow Rate (L/min): 2 L/min -Continue supplemental oxygen via nasal cannula wean O2 as tolerated Continuous pulse oximetry maintain O2 saturations greater than 90% -Will add guaifenesin 1200 mg p.o. twice daily flutter valve, incentive spirometry -Respiratory virus panel by PCR was negative for influenza A and B as well as respiratory syncytial virus and SARS-CoV-2 -Have added Xopenex and Atrovent twice daily as well as every 6 as needed for wheezing and shortness of breath -Patient will need ambulatory home O2 screen prior to discharge as well as repeat chest x-ray -Repeat CXR this AM done and showed "Unchanged cardiomegaly with pulmonary vascular congestion/interstitial edema. No new airspace disease."   Leukocytosis -WBC Trend: -In the setting of Steroid Demargination Recent Labs  Lab 02/26/23 0106 02/27/23 0100 02/28/23 0106 03/01/23 0051  WBC 9.0 11.1* 11.4* 13.9*  -Continue to Monitor and Trend and repeat CBC in the AM   Metabolic Acidosis -Mild and improved. CO2 was 21, AG was 9, Chloride Level was 106 and now improved as CO2 is 23, anion gap is 12, chloride level is 102 -Continue to Monitor and Trend and repeat CMP in the AM   Hypoalbuminemia -Patient's Albumin Trend: Recent Labs  Lab 02/27/23 0100 02/28/23 0944 03/01/23 0051  03/01/23 1220  ALBUMIN 3.5 3.4* 4.0 3.8  -Continue to Monitor and Trend and repeat CMP in the AM   Morbid Obesity -Complicates overall prognosis and care -Estimated body mass index is 47.76 kg/m as calculated from the following:   Height as of this encounter: 5\' 2"  (1.575 m).   Weight as of this encounter: 118.4 kg.  -Weight Loss and Dietary Counseling given  DVT prophylaxis: enoxaparin (LOVENOX) injection 40 mg Start: 03/02/23 0800    Code Status: Full Code Family Communication: No family present at bedside   Disposition Plan:  Level of care: Telemetry Cardiac Status is: Inpatient Remains inpatient appropriate because: Remains significantly volume overloaded and needs further clinical improvement and clearance by the cardiologist    Consultants:  Cardiology  Procedures:  ECHOCARDIOGRAM IMPRESSIONS     1. Left ventricular ejection fraction, by best estimation, is 20 to 25%.  The left ventricle has severely decreased function. Left ventricular  endocardial border not optimally defined to evaluate regional wall motion  despite contrast administration. The  left ventricular internal cavity size was mildly to moderately dilated.  Left ventricular diastolic parameters are indeterminate.   2. Right ventricular systolic function is hyperdynamic. The right  ventricular size is normal. Tricuspid regurgitation signal is inadequate  for assessing PA pressure.   3. The mitral valve is normal in structure. Mild to moderate mitral valve  regurgitation. No evidence of mitral stenosis.   4. The aortic valve was not well visualized. Aortic valve regurgitation  is not  visualized. No aortic stenosis is present.   Comparison(s): No prior Echocardiogram.   Conclusion(s)/Recommendation(s): Reaching out to primary team.   FINDINGS   Left Ventricle: Left ventricular ejection fraction, by estimation, is 20  to 25%. The left ventricle has severely decreased function. Left  ventricular  endocardial border not optimally defined to evaluate regional  wall motion. Definity contrast agent was  given IV to delineate the left ventricular endocardial borders. The left  ventricular internal cavity size was mildly to moderately dilated.  Suboptimal image quality limits for assessment of left ventricular  hypertrophy. Left ventricular diastolic  parameters are indeterminate.   Right Ventricle: The right ventricular size is normal. No increase in  right ventricular wall thickness. Right ventricular systolic function is  hyperdynamic. Tricuspid regurgitation signal is inadequate for assessing  PA pressure.   Left Atrium: Left atrial size was normal in size.   Right Atrium: Right atrial size was normal in size.   Pericardium: Trivial pericardial effusion is present. The pericardial  effusion is posterior to the left ventricle.   Mitral Valve: The mitral valve is normal in structure. Mild to moderate  mitral valve regurgitation. No evidence of mitral valve stenosis.   Tricuspid Valve: The tricuspid valve is not well visualized. Tricuspid  valve regurgitation is not demonstrated. No evidence of tricuspid  stenosis.   Aortic Valve: The aortic valve was not well visualized. Aortic valve  regurgitation is not visualized. No aortic stenosis is present.   Pulmonic Valve: The pulmonic valve was normal in structure. Pulmonic valve  regurgitation is not visualized. No evidence of pulmonic stenosis.   Aorta: The aortic root is normal in size and structure.   IAS/Shunts: No atrial level shunt detected by color flow Doppler.     LEFT VENTRICLE  PLAX 2D  LVIDd:         5.70 cm  LVIDs:         5.50 cm  LV PW:         1.10 cm  LV IVS:        1.20 cm  LVOT diam:     2.10 cm  LVOT Area:     3.46 cm     RIGHT VENTRICLE  RV Basal diam:  3.60 cm  RV Mid diam:    2.80 cm  RV S prime:     14.50 cm/s  TAPSE (M-mode): 2.1 cm   LEFT ATRIUM             Index        RIGHT ATRIUM            Index  LA diam:        4.60 cm 2.24 cm/m   RA Area:     19.30 cm  LA Vol (A2C):   56.7 ml 27.60 ml/m  RA Volume:   60.70 ml  29.54 ml/m  LA Vol (A4C):   56.1 ml 27.30 ml/m  LA Biplane Vol: 63.5 ml 30.90 ml/m     SHUNTS  Systemic Diam: 2.10 cm   CARDIAC CATH   The left ventricular ejection fraction is less than 25% by visual estimate.   Findings:   Ao = 133/93 LV =  134/35 RA = 13 RV = 65/24 PA = 59/42 (48) PCW = 33 Fick cardiac output/index = 6.2/2.9 Thermo CO/CI = 6.3/2.9 PVR = 2.4 WU Ao sat = 95% PA sat = 73%, 73% PAPi = 1.3   Assessment: 1. Severe NICM EF ~20% 2. Normal coronary  arteries 3. Marked volume overload  4. Preserved cardiac output   Plan/Discussion:    She remains tenuous. Will need further diuresis and titration of GDMT. Plan cMRI next week.   Antimicrobials:  Anti-infectives (From admission, onward)    None       Subjective: Seen and examined at bedside and after catheter was complaining of some lower extremity cramping and pain.  No nausea or vomiting.  Denies any lightheadedness or dizziness.  Still remains a little bit dyspneic.  No other concerns or complaints this time.  Objective: Vitals:   03/01/23 0857 03/01/23 0923 03/01/23 1142 03/01/23 1222  BP: 128/85 (!) 126/96  96/67  Pulse: (!) 126 (!) 113 97 97  Resp: (!) 30 (!) 22 18 19   Temp:  98.2 F (36.8 C)  98.2 F (36.8 C)  TempSrc:  Oral  Oral  SpO2: 95% 96%  98%  Weight:      Height:        Intake/Output Summary (Last 24 hours) at 03/01/2023 1515 Last data filed at 03/01/2023 1500 Gross per 24 hour  Intake 22.28 ml  Output 3000 ml  Net -2977.72 ml   Filed Weights   02/27/23 0016 02/28/23 0415 03/01/23 0348  Weight: 121.7 kg 120.6 kg 118.4 kg   Examination: Physical Exam:  Constitutional: WN/WD morbidly obese African-American female in no acute distress appears anxious Respiratory: Diminished to auscultation bilaterally with coarse breath sounds and has some  crackles, no wheezing, rales, rhonchi. Normal respiratory effort and patient is not tachypenic. No accessory muscle use. A little dyspneic on conversation  Cardiovascular: RRR, no murmurs / rubs / gallops. S1 and S2 auscultated. 1+ LE edema and has UNNA boots on  Abdomen: Soft, non-tender, Distended 2/2 body habitus. Bowel sounds positive.  GU: Deferred. Musculoskeletal: No clubbing / cyanosis of digits/nails. No joint deformity upper and lower extremities. Skin: No rashes, lesions, ulcers on a limited skin evaluation. No induration; Warm and dry.  Neurologic: CN 2-12 grossly intact with no focal deficits. Romberg sign and cerebellar reflexes not assessed.  Psychiatric: Normal judgment and insight. Alert and oriented x 3. Normal mood and appropriate affect.   Data Reviewed: I have personally reviewed following labs and imaging studies  CBC: Recent Labs  Lab 02/26/23 0106 02/27/23 0100 02/28/23 0106 03/01/23 0051  WBC 9.0 11.1* 11.4* 13.9*  NEUTROABS  --  8.7* 8.8* 10.1*  HGB 13.4 13.7 13.0 15.5*  HCT 40.1 41.4 40.2 46.7*  MCV 85.3 84.8 85.2 83.5  PLT 277 313 298 357   Basic Metabolic Panel: Recent Labs  Lab 02/26/23 0106 02/27/23 0100 02/28/23 0944 03/01/23 0051 03/01/23 1220  NA 139 136 136 136 137  K 4.2 4.4 3.5 4.7 3.8  CL 107 106 104 103 102  CO2 20* 21* 22 22 23   GLUCOSE 109* 115* 106* 97 108*  BUN 18 18 20  23* 19  CREATININE 0.94 0.94 0.96 1.08* 1.04*  CALCIUM 9.1 9.2 8.8* 9.6 9.4  MG  --   --  2.3 2.5*  --   PHOS  --   --  4.5 4.9* 4.4   GFR: Estimated Creatinine Clearance: 83.5 mL/min (A) (by C-G formula based on SCr of 1.04 mg/dL (H)). Liver Function Tests: Recent Labs  Lab 02/27/23 0100 02/28/23 0944 03/01/23 0051 03/01/23 1220  AST 24 19 24   --   ALT 20 19 24   --   ALKPHOS 44 37* 43  --   BILITOT 0.8 0.8 0.8  --   PROT  7.3 6.4* 7.6  --   ALBUMIN 3.5 3.4* 4.0 3.8   No results for input(s): "LIPASE", "AMYLASE" in the last 168 hours. No results for  input(s): "AMMONIA" in the last 168 hours. Coagulation Profile: No results for input(s): "INR", "PROTIME" in the last 168 hours. Cardiac Enzymes: No results for input(s): "CKTOTAL", "CKMB", "CKMBINDEX", "TROPONINI" in the last 168 hours. BNP (last 3 results) No results for input(s): "PROBNP" in the last 8760 hours. HbA1C: No results for input(s): "HGBA1C" in the last 72 hours. CBG: No results for input(s): "GLUCAP" in the last 168 hours. Lipid Profile: No results for input(s): "CHOL", "HDL", "LDLCALC", "TRIG", "CHOLHDL", "LDLDIRECT" in the last 72 hours. Thyroid Function Tests: Recent Labs    03/01/23 1220  TSH 2.339   Anemia Panel: No results for input(s): "VITAMINB12", "FOLATE", "FERRITIN", "TIBC", "IRON", "RETICCTPCT" in the last 72 hours. Sepsis Labs: No results for input(s): "PROCALCITON", "LATICACIDVEN" in the last 168 hours.  Recent Results (from the past 240 hour(s))  Resp panel by RT-PCR (RSV, Flu A&B, Covid) Anterior Nasal Swab     Status: None   Collection Time: 02/26/23  1:15 AM   Specimen: Anterior Nasal Swab  Result Value Ref Range Status   SARS Coronavirus 2 by RT PCR NEGATIVE NEGATIVE Final   Influenza A by PCR NEGATIVE NEGATIVE Final   Influenza B by PCR NEGATIVE NEGATIVE Final    Comment: (NOTE) The Xpert Xpress SARS-CoV-2/FLU/RSV plus assay is intended as an aid in the diagnosis of influenza from Nasopharyngeal swab specimens and should not be used as a sole basis for treatment. Nasal washings and aspirates are unacceptable for Xpert Xpress SARS-CoV-2/FLU/RSV testing.  Fact Sheet for Patients: BloggerCourse.com  Fact Sheet for Healthcare Providers: SeriousBroker.it  This test is not yet approved or cleared by the [place] FDA and has been authorized for detection and/or diagnosis of SARS-CoV-2 by FDA under an Emergency Use Authorization (EUA). This EUA will remain in effect (meaning this test can  be used) for the duration of the COVID-19 declaration under Section 564(b)(1) of the Act, 21 U.S.C. section 360bbb-3(b)(1), unless the authorization is terminated or revoked.     Resp Syncytial Virus by PCR NEGATIVE NEGATIVE Final    Comment: (NOTE) Fact Sheet for Patients: BloggerCourse.com  Fact Sheet for Healthcare Providers: SeriousBroker.it  This test is not yet approved or cleared by the [place] FDA and has been authorized for detection and/or diagnosis of SARS-CoV-2 by FDA under an Emergency Use Authorization (EUA). This EUA will remain in effect (meaning this test can be used) for the duration of the COVID-19 declaration under Section 564(b)(1) of the Act, 21 U.S.C. section 360bbb-3(b)(1), unless the authorization is terminated or revoked.  Performed at [facility], [address], [place], [place] [address]   Respiratory (~20 pathogens) panel by PCR     Status: None   Collection Time: 02/27/23  1:51 PM   Specimen: Nasopharyngeal Swab; Respiratory  Result Value Ref Range Status   Adenovirus NOT DETECTED NOT DETECTED Final   Coronavirus 229E NOT DETECTED NOT DETECTED Final    Comment: (NOTE) The Coronavirus on the Respiratory Panel, DOES NOT test for the novel  Coronavirus (2019 nCoV)    Coronavirus HKU1 NOT DETECTED NOT DETECTED Final   Coronavirus NL63 NOT DETECTED NOT DETECTED Final   Coronavirus OC43 NOT DETECTED NOT DETECTED Final   Metapneumovirus NOT DETECTED NOT DETECTED Final   Rhinovirus / Enterovirus NOT DETECTED NOT DETECTED Final   Influenza A NOT  DETECTED NOT DETECTED Final   Influenza B NOT DETECTED NOT DETECTED Final   Parainfluenza Virus 1 NOT DETECTED NOT DETECTED Final   Parainfluenza Virus 2 NOT DETECTED NOT DETECTED Final   Parainfluenza Virus 3 NOT DETECTED NOT DETECTED Final   Parainfluenza Virus 4 NOT DETECTED NOT DETECTED Final   Respiratory Syncytial Virus NOT DETECTED NOT  DETECTED Final   Bordetella pertussis NOT DETECTED NOT DETECTED Final   Bordetella Parapertussis NOT DETECTED NOT DETECTED Final   Chlamydophila pneumoniae NOT DETECTED NOT DETECTED Final   Mycoplasma pneumoniae NOT DETECTED NOT DETECTED Final    Comment: Performed at [facility], [address], [place], [place] [address]    Radiology Studies: CARDIAC CATHETERIZATION  Result Date: 03/01/2023   The left ventricular ejection fraction is less than 25% by visual estimate. Findings: Ao = 133/93 LV =  134/35 RA = 13 RV = 65/24 PA = 59/42 (48) PCW = 33 Fick cardiac output/index = 6.2/2.9 Thermo CO/CI = 6.3/2.9 PVR = 2.4 WU Ao sat = 95% PA sat = 73%, 73% PAPi = 1.3 Assessment: 1. Severe NICM EF ~20% 2. Normal coronary arteries 3. Marked volume overload 4. Preserved cardiac output Plan/Discussion: She remains tenuous. Will need further diuresis and titration of GDMT. Plan cMRI next week. Arvilla Meres, MD 9:38 AM  DG CHEST PORT 1 VIEW  Result Date: 03/01/2023 CLINICAL DATA:  [id number] SOB (shortness of breath) on exertion [id number] EXAM: PORTABLE CHEST 1 VIEW COMPARISON:  Radiograph 02/28/2023 FINDINGS: Unchanged enlarged cardiac silhouette. There are moderate social opacities, similar to prior exam. No new airspace disease. No large effusion or evidence of pneumothorax. Bones are unchanged. IMPRESSION: Unchanged cardiomegaly with pulmonary vascular congestion/interstitial edema. No new airspace disease. Electronically Signed   By: Caprice Renshaw M.D.   On: 03/01/2023 08:50   DG CHEST PORT 1 VIEW  Result Date: 02/28/2023 CLINICAL DATA:  Shortness of breath with cough.  History of asthma. EXAM: PORTABLE CHEST 1 VIEW COMPARISON:  Radiographs 02/27/2023, 02/26/2023 and 07/04/2016. FINDINGS: 0624 hours. Stable cardiomegaly and vascular congestion. Edema seen on the examination of 2 days ago has improved. There is no confluent airspace disease, pneumothorax or significant pleural effusion. The bones appear  unremarkable. Telemetry leads overlie the chest. IMPRESSION: Cardiomegaly and vascular congestion with improved edema from 2 days ago. No acute cardiopulmonary process. Electronically Signed   By: Carey Bullocks M.D.   On: 02/28/2023 08:34    Scheduled Meds:  digoxin  0.125 mg Oral Daily   [START ON 03/02/2023] enoxaparin (LOVENOX) injection  40 mg Subcutaneous Q24H   guaiFENesin  1,200 mg Oral BID   ipratropium  0.5 mg Nebulization BID   ivabradine  2.5 mg Oral BID WC   levalbuterol  0.63 mg Nebulization BID   losartan  12.5 mg Oral Daily   montelukast  10 mg Oral QHS   potassium chloride  40 mEq Oral BID   predniSONE  60 mg Oral Daily   sodium chloride flush  3 mL Intravenous Q12H   sodium chloride flush  3 mL Intravenous Q12H   spironolactone  25 mg Oral Daily   Continuous Infusions:  sodium chloride     furosemide (LASIX) 200 mg in dextrose 5 % 100 mL (2 mg/mL) infusion 12 mg/hr (03/01/23 1200)    LOS: 3 days   Marguerita Merles, DO [facility] Available via Epic secure chat 7am-7pm After these hours, please refer to coverage provider listed on [email or web address] 03/01/2023, 3:15 PM

## 2023-03-02 ENCOUNTER — Inpatient Hospital Stay (HOSPITAL_COMMUNITY): Payer: Self-pay

## 2023-03-02 DIAGNOSIS — I428 Other cardiomyopathies: Secondary | ICD-10-CM

## 2023-03-02 LAB — COMPREHENSIVE METABOLIC PANEL
ALT: 35 U/L (ref 0–44)
AST: 32 U/L (ref 15–41)
Albumin: 4.4 g/dL (ref 3.5–5.0)
Alkaline Phosphatase: 50 U/L (ref 38–126)
Anion gap: 17 — ABNORMAL HIGH (ref 5–15)
BUN: 34 mg/dL — ABNORMAL HIGH (ref 6–20)
CO2: 22 mmol/L (ref 22–32)
Calcium: 10.4 mg/dL — ABNORMAL HIGH (ref 8.9–10.3)
Chloride: 93 mmol/L — ABNORMAL LOW (ref 98–111)
Creatinine, Ser: 1.29 mg/dL — ABNORMAL HIGH (ref 0.44–1.00)
GFR, Estimated: 52 mL/min — ABNORMAL LOW (ref >60)
Glucose, Bld: 104 mg/dL — ABNORMAL HIGH (ref 70–99)
Potassium: 5.2 mmol/L — ABNORMAL HIGH (ref 3.5–5.1)
Sodium: 132 mmol/L — ABNORMAL LOW (ref 135–145)
Total Bilirubin: 0.6 mg/dL (ref 0.3–1.2)
Total Protein: 8 g/dL (ref 6.5–8.1)

## 2023-03-02 LAB — CBC WITH DIFFERENTIAL/PLATELET
Abs Immature Granulocytes: 0.05 10*3/uL (ref 0.00–0.07)
Basophils Absolute: 0 10*3/uL (ref 0.0–0.1)
Basophils Relative: 0 %
Eosinophils Absolute: 0 10*3/uL (ref 0.0–0.5)
Eosinophils Relative: 0 %
HCT: 47.8 % — ABNORMAL HIGH (ref 36.0–46.0)
Hemoglobin: 16.4 g/dL — ABNORMAL HIGH (ref 12.0–15.0)
Immature Granulocytes: 0 %
Lymphocytes Relative: 16 %
Lymphs Abs: 2.3 10*3/uL (ref 0.7–4.0)
MCH: 27.9 pg (ref 26.0–34.0)
MCHC: 34.3 g/dL (ref 30.0–36.0)
MCV: 81.4 fL (ref 80.0–100.0)
Monocytes Absolute: 1 10*3/uL (ref 0.1–1.0)
Monocytes Relative: 7 %
Neutro Abs: 10.9 10*3/uL — ABNORMAL HIGH (ref 1.7–7.7)
Neutrophils Relative %: 77 %
Platelets: 433 10*3/uL — ABNORMAL HIGH (ref 150–400)
RBC: 5.87 MIL/uL — ABNORMAL HIGH (ref 3.87–5.11)
RDW: 14.4 % (ref 11.5–15.5)
WBC: 14.3 10*3/uL — ABNORMAL HIGH (ref 4.0–10.5)
nRBC: 0 % (ref 0.0–0.2)

## 2023-03-02 LAB — MAGNESIUM
Magnesium: 2.6 mg/dL — ABNORMAL HIGH (ref 1.7–2.4)
Magnesium: 2.5 mg/dL — ABNORMAL HIGH (ref 1.7–2.4)

## 2023-03-02 LAB — BASIC METABOLIC PANEL
Anion gap: 19 — ABNORMAL HIGH (ref 5–15)
BUN: 38 mg/dL — ABNORMAL HIGH (ref 6–20)
CO2: 23 mmol/L (ref 22–32)
Calcium: 9.9 mg/dL (ref 8.9–10.3)
Chloride: 93 mmol/L — ABNORMAL LOW (ref 98–111)
Creatinine, Ser: 1.45 mg/dL — ABNORMAL HIGH (ref 0.44–1.00)
GFR, Estimated: 45 mL/min — ABNORMAL LOW (ref >60)
Glucose, Bld: 96 mg/dL (ref 70–99)
Potassium: 4.1 mmol/L (ref 3.5–5.1)
Sodium: 135 mmol/L (ref 135–145)

## 2023-03-02 LAB — POTASSIUM: Potassium: 4.3 mmol/L (ref 3.5–5.1)

## 2023-03-02 LAB — PHOSPHORUS: Phosphorus: 7.4 mg/dL — ABNORMAL HIGH (ref 2.5–4.6)

## 2023-03-02 LAB — BRAIN NATRIURETIC PEPTIDE: B Natriuretic Peptide: 251.6 pg/mL — ABNORMAL HIGH (ref 0.0–100.0)

## 2023-03-02 MED ORDER — FUROSEMIDE 10 MG/ML IJ SOLN
80.0000 mg | Freq: Two times a day (BID) | INTRAMUSCULAR | Status: DC
  Administered 2023-03-02 – 2023-03-03 (×2): 80 mg via INTRAVENOUS
  Filled 2023-03-02 (×3): qty 8

## 2023-03-02 NOTE — Progress Notes (Signed)
PROGRESS NOTE    Carolyn Oconnor  WUJ:[id number] DOB: 06/26/1976 DOA: 02/26/2023 PCP: System, Provider Not In   Brief Narrative:  The patient Carolyn Oconnor is a 47 y.o. female with medical history significant of asthma, allergic rhinitis, chronic bronchitis, obesity presented to ED with complaints of shortness of breath, cough, wheezing, and bilateral lower extremity edema.  Patient got admitted for suspected CHF exacerbation, asthma exacerbation.  He has been initiated on diuresis with breathing treatments patient is reporting some improvement.  Although she is on room air patient still has conversational dyspnea and tachycardia even at rest.  Further workup reveals acute systolic CHF and cardiology consulted and took the patient right and left heart cath today.     Cath was done and showed markedly elevated filling pressures on the right heart cath this morning and also showed normal coronaries however she is shown to have severe NICM EF ~20% along with marked volume overload and preserved cardiac output. Cardiology was diuresing her given her volume overload and starting her on IV Lasix drip at 12 mg/hr outpatient had an adequate response and complained of significant cramping and was for her Lasix drip to be stopped so this was discontinued and cardiology is now changing her back to IV Lasix twice daily  Assessment and Plan:  Acute Systolic CHF Tachycardia NICM -Patient with no documented history of CHF or prior echo results in the chart presenting with 1 month history of progressively worsening dyspnea, orthopnea, bilateral lower extremity edema, and weight gain.   -BNP went from 919.3 -> 868.7 -> 432.8 and is now 421.0 -> 256.1 -Chest x-ray showing central vascular congestion, BNP 919 -Continue IV diuresis with Lasix and had increased from IV 40 to twice daily dosing but cardiology had increased it further to IV 80 twice daily and recommending continuing it today but now changing her to a  Lasix drip on 03/01/2023 this was stopped by cardiology given the patient adamantly demanded to have it removed given her cramping and also had her Unna boot removed.  Etiology is not changing back to IV twice daily Lasix 80 mg on 03/02/2023 Intake/Output Summary (Last 24 hours) at 03/02/2023 1659 Last data filed at 03/02/2023 1500 Gross per 24 hour  Intake 623.9 ml  Output 4350 ml  Net -3726.1 ml  -Echocardiogram ordered and showed "Left ventricular ejection fraction, by best estimation, is 20 to 25%. The left ventricle has severely decreased function. Left ventricular  endocardial border not optimally defined to evaluate regional wall motion despite contrast administration. The left ventricular internal cavity size was mildly to moderately dilated. Left ventricular diastolic parameters are indeterminate. Right ventricular systolic function is hyperdynamic. The right ventricular size is normal. Tricuspid regurgitation signal is inadequate for assessing PA pressure. The mitral valve is normal in structure. Mild to moderate mitral valve regurgitation. No evidence of mitral stenosis. The aortic valve was not well visualized. Aortic valve regurgitation is not visualized. No aortic stenosis is present." -Fluid and salt restriction to be continued at 1200 mL -Patient still clearly is symptomatic with conversational dyspnea although she is not oxygen dependent and is on room air. -Given new onset CHF we have consulted Cardiology for further evaluation recommendations -Heart failure team has been consulted and they are recommending a right and left heart cath tomorrow as well as a cardiac MRI if the cath is unrevealing -They started the patient on spironolactone 12.5 mg p.o. daily, losartan 12.5 mg p.o. daily given that her blood pressure is too  soft for Entresto at this time and also started digoxin 0.125 mg p.o. daily; Now Cardiology has increased Spironolactone to 25 mg po Daily added losartan 12.5 mg p.o.  daily -Currently the patient has not been started on a beta-blocker given her acute decompensation but if it is added in the future they are going to consider bisoprolol given the patient's asthma -UDS negative  -TSH was 2.339 -Cardiology consulted and planning a right and left heart cath tomorrow and it was done today and it showed severe nonischemic cardiomyopathy with an EF of around 20%, normal coronaries, marked volume overload but preserved cardiac output and markedly elevated filling pressures on right heart cath.  Cardiology is now initiating on Lasix drip and diuresing and starting her on possibly metolazone -Given her tachycardia cardiology started her on Corlanor 2.5 mL p.o. twice daily   Acute Asthma Exacerbation -Overall wheezing has improved, however still has clearly conversational dyspnea and tachycardia -Continue prednisone 60, DuoNebs every 6 hours and Singulair -SpO2: 98 % O2 Flow Rate (L/min): 2 L/min -Continue supplemental oxygen via nasal cannula wean O2 as tolerated Continuous pulse oximetry maintain O2 saturations greater than 90% -Will add guaifenesin 1200 mg p.o. twice daily flutter valve, incentive spirometry -Respiratory virus panel by PCR was negative for influenza A and B as well as respiratory syncytial virus and SARS-CoV-2 -Have added Xopenex and Atrovent twice daily as well as every 6 as needed for wheezing and shortness of breath -Patient will need ambulatory home O2 screen prior to discharge as well as repeat chest x-ray -Repeat CXR this AM done and showed "Lung volumes are normal. No consolidative airspace disease. No pleural effusions. Diffuse peribronchial cuffing and interstitial prominence. No pneumothorax. Cephalization of the pulmonary vasculature. Mild cardiomegaly. Upper mediastinal contours are within normal limits allowing for patient rotation to the right."  AKI -In the setting of Lasix and diuresis.  BUN/Cr Trend: Recent Labs  Lab 02/26/23 0106  02/27/23 0100 02/28/23 0944 03/01/23 0051 03/01/23 1220 03/02/23 0105 03/02/23 1048  BUN 18 18 20  23* 19 34* 38*  CREATININE 0.94 0.94 0.96 1.08* 1.04* 1.29* 1.45*  -Lasix drip is now stopping and cardiology is now adjusting that IV twice daily -Avoid Nephrotoxic Medications, Contrast Dyes if able, Hypotension and Dehydration to Ensure Adequate Renal Perfusion and will need to Renally Adjust Meds -Continue to Monitor and Trend Renal Function carefully and repeat CMP in the AM    Leukocytosis -WBC Trend: -In the setting of Steroid Demargination Recent Labs  Lab 02/26/23 0106 02/27/23 0100 02/28/23 0106 03/01/23 0051 03/02/23 0105  WBC 9.0 11.1* 11.4* 13.9* 14.3*  -Continue to Monitor and Trend and repeat CBC in the AM   Metabolic Acidosis -Mild and improved. CO2 was 21, AG was 9, Chloride Level was 106 and now improved as CO2 is 23, anion gap is 19 chloride level is 93 -Continue to Monitor and Trend and repeat CMP in the AM  Thromobocytosis -Patient's Platelet Count went from 357 -> 433 -Continue to Monitor and Trend and repeat CBC in the AM   Hypoalbuminemia -Patient's Albumin Trend: Recent Labs  Lab 02/27/23 0100 02/28/23 0944 03/01/23 0051 03/01/23 1220 03/02/23 0105  ALBUMIN 3.5 3.4* 4.0 3.8 4.4  -Continue to Monitor and Trend and repeat CMP in the AM   Morbid Obesity -Complicates overall prognosis and care -Estimated body mass index is 46.02 kg/m as calculated from the following:   Height as of this encounter: 5\' 2"  (1.575 m).   Weight as of this encounter:  114.1 kg.  -Weight Loss and Dietary Counseling given  DVT prophylaxis: enoxaparin (LOVENOX) injection 40 mg Start: 03/02/23 0800    Code Status: Full Code Family Communication: No Family currently at bedside  Disposition Plan:  Level of care: Telemetry Cardiac Status is: Inpatient Remains inpatient appropriate because: His further clinical improvement and clearance by cardiology   Consultants:   Cardiology  Procedures:  ECHOCARDIOGRAM IMPRESSIONS     1. Left ventricular ejection fraction, by best estimation, is 20 to 25%.  The left ventricle has severely decreased function. Left ventricular  endocardial border not optimally defined to evaluate regional wall motion  despite contrast administration. The  left ventricular internal cavity size was mildly to moderately dilated.  Left ventricular diastolic parameters are indeterminate.   2. Right ventricular systolic function is hyperdynamic. The right  ventricular size is normal. Tricuspid regurgitation signal is inadequate  for assessing PA pressure.   3. The mitral valve is normal in structure. Mild to moderate mitral valve  regurgitation. No evidence of mitral stenosis.   4. The aortic valve was not well visualized. Aortic valve regurgitation  is not visualized. No aortic stenosis is present.   Comparison(s): No prior Echocardiogram.   Conclusion(s)/Recommendation(s): Reaching out to primary team.   FINDINGS   Left Ventricle: Left ventricular ejection fraction, by estimation, is 20  to 25%. The left ventricle has severely decreased function. Left  ventricular endocardial border not optimally defined to evaluate regional  wall motion. Definity contrast agent was  given IV to delineate the left ventricular endocardial borders. The left  ventricular internal cavity size was mildly to moderately dilated.  Suboptimal image quality limits for assessment of left ventricular  hypertrophy. Left ventricular diastolic  parameters are indeterminate.   Right Ventricle: The right ventricular size is normal. No increase in  right ventricular wall thickness. Right ventricular systolic function is  hyperdynamic. Tricuspid regurgitation signal is inadequate for assessing  PA pressure.   Left Atrium: Left atrial size was normal in size.   Right Atrium: Right atrial size was normal in size.   Pericardium: Trivial pericardial  effusion is present. The pericardial  effusion is posterior to the left ventricle.   Mitral Valve: The mitral valve is normal in structure. Mild to moderate  mitral valve regurgitation. No evidence of mitral valve stenosis.   Tricuspid Valve: The tricuspid valve is not well visualized. Tricuspid  valve regurgitation is not demonstrated. No evidence of tricuspid  stenosis.   Aortic Valve: The aortic valve was not well visualized. Aortic valve  regurgitation is not visualized. No aortic stenosis is present.   Pulmonic Valve: The pulmonic valve was normal in structure. Pulmonic valve  regurgitation is not visualized. No evidence of pulmonic stenosis.   Aorta: The aortic root is normal in size and structure.   IAS/Shunts: No atrial level shunt detected by color flow Doppler.     LEFT VENTRICLE  PLAX 2D  LVIDd:         5.70 cm  LVIDs:         5.50 cm  LV PW:         1.10 cm  LV IVS:        1.20 cm  LVOT diam:     2.10 cm  LVOT Area:     3.46 cm     RIGHT VENTRICLE  RV Basal diam:  3.60 cm  RV Mid diam:    2.80 cm  RV S prime:     14.50 cm/s  TAPSE (M-mode): 2.1 cm   LEFT ATRIUM             Index        RIGHT ATRIUM           Index  LA diam:        4.60 cm 2.24 cm/m   RA Area:     19.30 cm  LA Vol (A2C):   56.7 ml 27.60 ml/m  RA Volume:   60.70 ml  29.54 ml/m  LA Vol (A4C):   56.1 ml 27.30 ml/m  LA Biplane Vol: 63.5 ml 30.90 ml/m     SHUNTS  Systemic Diam: 2.10 cm    CARDIAC CATH   The left ventricular ejection fraction is less than 25% by visual estimate.   Findings:   Ao = 133/93 LV =  134/35 RA = 13 RV = 65/24 PA = 59/42 (48) PCW = 33 Fick cardiac output/index = 6.2/2.9 Thermo CO/CI = 6.3/2.9 PVR = 2.4 WU Ao sat = 95% PA sat = 73%, 73% PAPi = 1.3   Assessment: 1. Severe NICM EF ~20% 2. Normal coronary arteries 3. Marked volume overload  4. Preserved cardiac output  Antimicrobials:  Anti-infectives (From admission, onward)    None        Subjective: Seen and examined at bedside and she is complain of significant cramping and morning her Lasix drip stopped immediately.  Had a coughing fit this morning to.  Wanting her Unna boots off as well.  And is lightheadedness or dizziness.  No other concerns or complaints at this time but thinks her breathing is better.  Objective: Vitals:   03/02/23 0449 03/02/23 0818 03/02/23 0927 03/02/23 1648  BP: (!) 108/51  (!) 130/94 104/60  Pulse: (!) 110  (!) 117 (!) 106  Resp: 20   20  Temp: 97.9 F (36.6 C)  (!) 97.1 F (36.2 C) 98.7 F (37.1 C)  TempSrc: Oral  Oral Oral  SpO2:  98% 98% 98%  Weight:      Height:        Intake/Output Summary (Last 24 hours) at 03/02/2023 1657 Last data filed at 03/02/2023 1500 Gross per 24 hour  Intake 623.9 ml  Output 4350 ml  Net -3726.1 ml   Filed Weights   02/28/23 0415 03/01/23 0348 03/02/23 0440  Weight: 120.6 kg 118.4 kg 114.1 kg   Examination: Physical Exam:  Constitutional: WN/WD obese African-American female who appears uncomfortable and alert agitated Respiratory: Diminished to auscultation bilaterally with some coarse breath sounds and has some crackles., no wheezing, rales, rhonchi. Normal respiratory effort and patient is not tachypenic. No accessory muscle use.  Unlabored breathing Cardiovascular: RRR, no murmurs / rubs / gallops. S1 and S2 auscultated.  1+ lower extremity edema.  Abdomen: Soft, non-tender, distended secondary to body habitus. Bowel sounds positive.  GU: Deferred. Musculoskeletal: No clubbing / cyanosis of digits/nails. No joint deformity upper and lower extremities Skin: No rashes, lesions, ulcers. No induration; Warm and dry.  Neurologic: CN 2-12 grossly intact with no focal deficits.  Romberg sign and cerebellar reflexes not assessed.  Psychiatric: Normal judgment and insight. Alert and oriented x 3.  Agitated mood and appropriate affect.   Data Reviewed: I have personally reviewed following labs and imaging  studies  CBC: Recent Labs  Lab 02/26/23 0106 02/27/23 0100 02/28/23 0106 03/01/23 0051 03/01/23 0840 03/01/23 0844 03/02/23 0105  WBC 9.0 11.1* 11.4* 13.9*  --   --  14.3*  NEUTROABS  --  8.7* 8.8* 10.1*  --   --  10.9*  HGB 13.4 13.7 13.0 15.5* 15.0 15.6*  16.0* 16.4*  HCT 40.1 41.4 40.2 46.7* 44.0 46.0  47.0* 47.8*  MCV 85.3 84.8 85.2 83.5  --   --  81.4  PLT 277 313 298 357  --   --  433*   Basic Metabolic Panel: Recent Labs  Lab 02/28/23 0944 03/01/23 0051 03/01/23 0840 03/01/23 0844 03/01/23 1220 03/02/23 0105 03/02/23 0600 03/02/23 1048  NA 136 136 140 139  138 137 132*  --  135  K 3.5 4.7 3.8 4.2  4.3 3.8 5.2* 4.3 4.1  CL 104 103  --   --  102 93*  --  93*  CO2 22 22  --   --  23 22  --  23  GLUCOSE 106* 97  --   --  108* 104*  --  96  BUN 20 23*  --   --  19 34*  --  38*  CREATININE 0.96 1.08*  --   --  1.04* 1.29*  --  1.45*  CALCIUM 8.8* 9.6  --   --  9.4 10.4*  --  9.9  MG 2.3 2.5*  --   --   --  2.6* 2.5*  --   PHOS 4.5 4.9*  --   --  4.4 7.4*  --   --    GFR: Estimated Creatinine Clearance: 58.6 mL/min (A) (by C-G formula based on SCr of 1.45 mg/dL (H)). Liver Function Tests: Recent Labs  Lab 02/27/23 0100 02/28/23 0944 03/01/23 0051 03/01/23 1220 03/02/23 0105  AST 24 19 24   --  32  ALT 20 19 24   --  35  ALKPHOS 44 37* 43  --  50  BILITOT 0.8 0.8 0.8  --  0.6  PROT 7.3 6.4* 7.6  --  8.0  ALBUMIN 3.5 3.4* 4.0 3.8 4.4   No results for input(s): "LIPASE", "AMYLASE" in the last 168 hours. No results for input(s): "AMMONIA" in the last 168 hours. Coagulation Profile: No results for input(s): "INR", "PROTIME" in the last 168 hours. Cardiac Enzymes: No results for input(s): "CKTOTAL", "CKMB", "CKMBINDEX", "TROPONINI" in the last 168 hours. BNP (last 3 results) No results for input(s): "PROBNP" in the last 8760 hours. HbA1C: No results for input(s): "HGBA1C" in the last 72 hours. CBG: No results for input(s): "GLUCAP" in the last 168  hours. Lipid Profile: No results for input(s): "CHOL", "HDL", "LDLCALC", "TRIG", "CHOLHDL", "LDLDIRECT" in the last 72 hours. Thyroid Function Tests: Recent Labs    03/01/23 1220  TSH 2.339   Anemia Panel: No results for input(s): "VITAMINB12", "FOLATE", "FERRITIN", "TIBC", "IRON", "RETICCTPCT" in the last 72 hours. Sepsis Labs: No results for input(s): "PROCALCITON", "LATICACIDVEN" in the last 168 hours.  Recent Results (from the past 240 hour(s))  Resp panel by RT-PCR (RSV, Flu A&B, Covid) Anterior Nasal Swab     Status: None   Collection Time: 02/26/23  1:15 AM   Specimen: Anterior Nasal Swab  Result Value Ref Range Status   SARS Coronavirus 2 by RT PCR NEGATIVE NEGATIVE Final   Influenza A by PCR NEGATIVE NEGATIVE Final   Influenza B by PCR NEGATIVE NEGATIVE Final    Comment: (NOTE) The Xpert Xpress SARS-CoV-2/FLU/RSV plus assay is intended as an aid in the diagnosis of influenza from Nasopharyngeal swab specimens and should not be used as a sole basis for treatment. Nasal washings and aspirates are unacceptable for Xpert Xpress SARS-CoV-2/FLU/RSV  testing.  Fact Sheet for Patients: BloggerCourse.com  Fact Sheet for Healthcare Providers: SeriousBroker.it  This test is not yet approved or cleared by the [place] FDA and has been authorized for detection and/or diagnosis of SARS-CoV-2 by FDA under an Emergency Use Authorization (EUA). This EUA will remain in effect (meaning this test can be used) for the duration of the COVID-19 declaration under Section 564(b)(1) of the Act, 21 U.S.C. section 360bbb-3(b)(1), unless the authorization is terminated or revoked.     Resp Syncytial Virus by PCR NEGATIVE NEGATIVE Final    Comment: (NOTE) Fact Sheet for Patients: BloggerCourse.com  Fact Sheet for Healthcare Providers: SeriousBroker.it  This test is not yet approved or  cleared by the [place] FDA and has been authorized for detection and/or diagnosis of SARS-CoV-2 by FDA under an Emergency Use Authorization (EUA). This EUA will remain in effect (meaning this test can be used) for the duration of the COVID-19 declaration under Section 564(b)(1) of the Act, 21 U.S.C. section 360bbb-3(b)(1), unless the authorization is terminated or revoked.  Performed at [facility], [address] [place], [place] [address]   Respiratory (~20 pathogens) panel by PCR     Status: None   Collection Time: 02/27/23  1:51 PM   Specimen: Nasopharyngeal Swab; Respiratory  Result Value Ref Range Status   Adenovirus NOT DETECTED NOT DETECTED Final   Coronavirus 229E NOT DETECTED NOT DETECTED Final    Comment: (NOTE) The Coronavirus on the Respiratory Panel, DOES NOT test for the novel  Coronavirus (2019 nCoV)    Coronavirus HKU1 NOT DETECTED NOT DETECTED Final   Coronavirus NL63 NOT DETECTED NOT DETECTED Final   Coronavirus OC43 NOT DETECTED NOT DETECTED Final   Metapneumovirus NOT DETECTED NOT DETECTED Final   Rhinovirus / Enterovirus NOT DETECTED NOT DETECTED Final   Influenza A NOT DETECTED NOT DETECTED Final   Influenza B NOT DETECTED NOT DETECTED Final   Parainfluenza Virus 1 NOT DETECTED NOT DETECTED Final   Parainfluenza Virus 2 NOT DETECTED NOT DETECTED Final   Parainfluenza Virus 3 NOT DETECTED NOT DETECTED Final   Parainfluenza Virus 4 NOT DETECTED NOT DETECTED Final   Respiratory Syncytial Virus NOT DETECTED NOT DETECTED Final   Bordetella pertussis NOT DETECTED NOT DETECTED Final   Bordetella Parapertussis NOT DETECTED NOT DETECTED Final   Chlamydophila pneumoniae NOT DETECTED NOT DETECTED Final   Mycoplasma pneumoniae NOT DETECTED NOT DETECTED Final    Comment: Performed at [facility], [address] [place], [place] [address]    Radiology Studies: DG CHEST PORT 1 VIEW  Result Date: 03/02/2023 CLINICAL DATA:  47 year old female with  history of shortness of breath. EXAM: PORTABLE CHEST 1 VIEW COMPARISON:  Chest x-ray 03/01/2023. FINDINGS: Lung volumes are normal. No consolidative airspace disease. No pleural effusions. Diffuse peribronchial cuffing and interstitial prominence. No pneumothorax. Cephalization of the pulmonary vasculature. Mild cardiomegaly. Upper mediastinal contours are within normal limits allowing for patient rotation to the right. IMPRESSION: 1. Similar appearance of the chest suggestive of resolving congestive heart failure, as above. Electronically Signed   By: Trudie Reed M.D.   On: 03/02/2023 10:08   CARDIAC CATHETERIZATION  Result Date: 03/01/2023   The left ventricular ejection fraction is less than 25% by visual estimate. Findings: Ao = 133/93 LV =  134/35 RA = 13 RV = 65/24 PA = 59/42 (48) PCW = 33 Fick cardiac output/index = 6.2/2.9 Thermo CO/CI = 6.3/2.9 PVR = 2.4 WU Ao sat = 95% PA  sat = 73%, 73% PAPi = 1.3 Assessment: 1. Severe NICM EF ~20% 2. Normal coronary arteries 3. Marked volume overload 4. Preserved cardiac output Plan/Discussion: She remains tenuous. Will need further diuresis and titration of GDMT. Plan cMRI next week. Arvilla Meres, MD 9:38 AM  DG CHEST PORT 1 VIEW  Result Date: 03/01/2023 CLINICAL DATA:  [id number] SOB (shortness of breath) on exertion [id number] EXAM: PORTABLE CHEST 1 VIEW COMPARISON:  Radiograph 02/28/2023 FINDINGS: Unchanged enlarged cardiac silhouette. There are moderate social opacities, similar to prior exam. No new airspace disease. No large effusion or evidence of pneumothorax. Bones are unchanged. IMPRESSION: Unchanged cardiomegaly with pulmonary vascular congestion/interstitial edema. No new airspace disease. Electronically Signed   By: Caprice Renshaw M.D.   On: 03/01/2023 08:50    Scheduled Meds:  digoxin  0.125 mg Oral Daily   enoxaparin (LOVENOX) injection  40 mg Subcutaneous Q24H   furosemide  80 mg Intravenous BID   guaiFENesin  1,200 mg Oral BID   ipratropium   0.5 mg Nebulization BID   ivabradine  2.5 mg Oral BID WC   levalbuterol  0.63 mg Nebulization BID   losartan  12.5 mg Oral Daily   montelukast  10 mg Oral QHS   predniSONE  60 mg Oral Daily   sodium chloride flush  3 mL Intravenous Q12H   sodium chloride flush  3 mL Intravenous Q12H   spironolactone  25 mg Oral Daily   Continuous Infusions:  sodium chloride      LOS: 4 days   Marguerita Merles, DO [facility] Available via Epic secure chat 7am-7pm After these hours, please refer to coverage provider listed on [email or web address] 03/02/2023, 4:57 PM

## 2023-03-02 NOTE — Progress Notes (Signed)
Bilateral [employer] removed.Tolerated well.

## 2023-03-02 NOTE — Progress Notes (Signed)
Rounding Note    Patient Name: Carolyn Oconnor Date of Encounter: 03/02/2023  [facility] Cardiologist: None   Subjective   Complaining of significant LE cramps with the IV lasix. Asking to have the IV drip stopped as her swelling has significantly improved and she "cannot take these cramps." Demands to have unna boots cut off as well.  RHC with elevated filling pressures but preserved CO/CI LHC with clean coronaries Net negatuve 4.3L overnight Wt down 261>251lbs  Inpatient Medications    Scheduled Meds:  digoxin  0.125 mg Oral Daily   enoxaparin (LOVENOX) injection  40 mg Subcutaneous Q24H   guaiFENesin  1,200 mg Oral BID   ipratropium  0.5 mg Nebulization BID   ivabradine  2.5 mg Oral BID WC   levalbuterol  0.63 mg Nebulization BID   losartan  12.5 mg Oral Daily   montelukast  10 mg Oral QHS   predniSONE  60 mg Oral Daily   sodium chloride flush  3 mL Intravenous Q12H   sodium chloride flush  3 mL Intravenous Q12H   spironolactone  25 mg Oral Daily   Continuous Infusions:  sodium chloride     furosemide (LASIX) 200 mg in dextrose 5 % 100 mL (2 mg/mL) infusion 12 mg/hr (03/01/23 2330)   PRN Meds: sodium chloride, acetaminophen **OR** acetaminophen, ipratropium, levalbuterol, methocarbamol, sodium chloride flush   Vital Signs    Vitals:   03/02/23 0440 03/02/23 0449 03/02/23 0818 03/02/23 0927  BP:  (!) 108/51  (!) 130/94  Pulse:  (!) 110  (!) 117  Resp:  20    Temp:  97.9 F (36.6 C)  (!) 97.1 F (36.2 C)  TempSrc: Oral Oral  Oral  SpO2:   98% 98%  Weight: 114.1 kg     Height:        Intake/Output Summary (Last 24 hours) at 03/02/2023 1016 Last data filed at 03/02/2023 0900 Gross per 24 hour  Intake 420.8 ml  Output 4850 ml  Net -4429.2 ml      03/02/2023    4:40 AM 03/01/2023    3:48 AM 02/28/2023    4:15 AM  Last 3 Weights  Weight (lbs) 251 lb 9.6 oz 261 lb 1.6 oz 265 lb 14.4 oz  Weight (kg) 114.125 kg 118.434 kg 120.611 kg      Telemetry     NSR, ST, brief run of NSVT - Personally Reviewed  ECG    No new tracing - Personally Reviewed  Physical Exam   GEN: NAD Neck: JVD difficult to assess due to body habitus Cardiac: Tachycardic, regular, no murmurs Respiratory: Coughing repeatedly but no wheezing or crackles GI: Soft, nontender, non-distended  MS: Trace edema, wrapped in unna boots Neuro:  Nonfocal  Psych: Normal affect   Labs    High Sensitivity Troponin:   Recent Labs  Lab 02/26/23 0106 02/26/23 0403  TROPONINIHS 16 16     Chemistry Recent Labs  Lab 02/28/23 0944 03/01/23 0051 03/01/23 0840 03/01/23 0844 03/01/23 1220 03/02/23 0105 03/02/23 0600  NA 136 136   < > 139  138 137 132*  --   K 3.5 4.7   < > 4.2  4.3 3.8 5.2* 4.3  CL 104 103  --   --  102 93*  --   CO2 22 22  --   --  23 22  --   GLUCOSE 106* 97  --   --  108* 104*  --   BUN 20  23*  --   --  19 34*  --   CREATININE 0.96 1.08*  --   --  1.04* 1.29*  --   CALCIUM 8.8* 9.6  --   --  9.4 10.4*  --   MG 2.3 2.5*  --   --   --  2.6* 2.5*  PROT 6.4* 7.6  --   --   --  8.0  --   ALBUMIN 3.4* 4.0  --   --  3.8 4.4  --   AST 19 24  --   --   --  32  --   ALT 19 24  --   --   --  35  --   ALKPHOS 37* 43  --   --   --  50  --   BILITOT 0.8 0.8  --   --   --  0.6  --   GFRNONAA >60 >60  --   --  >60 52*  --   ANIONGAP 10 11  --   --  12 17*  --    < > = values in this interval not displayed.    Lipids No results for input(s): "CHOL", "TRIG", "HDL", "LABVLDL", "LDLCALC", "CHOLHDL" in the last 168 hours.  Hematology Recent Labs  Lab 02/28/23 0106 03/01/23 0051 03/01/23 0840 03/01/23 0844 03/02/23 0105  WBC 11.4* 13.9*  --   --  14.3*  RBC 4.72 5.59*  --   --  5.87*  HGB 13.0 15.5* 15.0 15.6*  16.0* 16.4*  HCT 40.2 46.7* 44.0 46.0  47.0* 47.8*  MCV 85.2 83.5  --   --  81.4  MCH 27.5 27.7  --   --  27.9  MCHC 32.3 33.2  --   --  34.3  RDW 14.6 14.6  --   --  14.4  PLT 298 357  --   --  433*   Thyroid  Recent Labs  Lab  03/01/23 1220  TSH 2.339    BNP Recent Labs  Lab 02/27/23 0100 02/28/23 0106 03/01/23 0051  BNP 868.7* 432.8* 421.0*    DDimer No results for input(s): "DDIMER" in the last 168 hours.   Radiology    DG CHEST PORT 1 VIEW  Result Date: 03/02/2023 CLINICAL DATA:  47 year old female with history of shortness of breath. EXAM: PORTABLE CHEST 1 VIEW COMPARISON:  Chest x-ray 03/01/2023. FINDINGS: Lung volumes are normal. No consolidative airspace disease. No pleural effusions. Diffuse peribronchial cuffing and interstitial prominence. No pneumothorax. Cephalization of the pulmonary vasculature. Mild cardiomegaly. Upper mediastinal contours are within normal limits allowing for patient rotation to the right. IMPRESSION: 1. Similar appearance of the chest suggestive of resolving congestive heart failure, as above. Electronically Signed   By: Trudie Reed M.D.   On: 03/02/2023 10:08   CARDIAC CATHETERIZATION  Result Date: 03/01/2023   The left ventricular ejection fraction is less than 25% by visual estimate. Findings: Ao = 133/93 LV =  134/35 RA = 13 RV = 65/24 PA = 59/42 (48) PCW = 33 Fick cardiac output/index = 6.2/2.9 Thermo CO/CI = 6.3/2.9 PVR = 2.4 WU Ao sat = 95% PA sat = 73%, 73% PAPi = 1.3 Assessment: 1. Severe NICM EF ~20% 2. Normal coronary arteries 3. Marked volume overload 4. Preserved cardiac output Plan/Discussion: She remains tenuous. Will need further diuresis and titration of GDMT. Plan cMRI next week. Arvilla Meres, MD 9:38 AM  DG CHEST PORT 1 VIEW  Result Date: 03/01/2023 CLINICAL DATA:  161096 SOB (shortness of breath) on exertion [id number] EXAM: PORTABLE CHEST 1 VIEW COMPARISON:  Radiograph 02/28/2023 FINDINGS: Unchanged enlarged cardiac silhouette. There are moderate social opacities, similar to prior exam. No new airspace disease. No large effusion or evidence of pneumothorax. Bones are unchanged. IMPRESSION: Unchanged cardiomegaly with pulmonary vascular  congestion/interstitial edema. No new airspace disease. Electronically Signed   By: Caprice Renshaw M.D.   On: 03/01/2023 08:50    Cardiac Studies   Cardiac Studies & Procedures   CARDIAC CATHETERIZATION  CARDIAC CATHETERIZATION 03/01/2023  Narrative   The left ventricular ejection fraction is less than 25% by visual estimate.  Findings:  Ao = 133/93 LV =  134/35 RA = 13 RV = 65/24 PA = 59/42 (48) PCW = 33 Fick cardiac output/index = 6.2/2.9 Thermo CO/CI = 6.3/2.9 PVR = 2.4 WU Ao sat = 95% PA sat = 73%, 73% PAPi = 1.3  Assessment: 1. Severe NICM EF ~20% 2. Normal coronary arteries 3. Marked volume overload 4. Preserved cardiac output  Plan/Discussion:  She remains tenuous. Will need further diuresis and titration of GDMT. Plan cMRI next week.  Arvilla Meres, MD 9:38 AM  Findings Coronary Findings Diagnostic  Dominance: Right  Left Main Vessel is angiographically normal.  Left Anterior Descending Vessel is angiographically normal.  Left Circumflex Vessel is angiographically normal.  Right Coronary Artery Vessel is angiographically normal.  Intervention  No interventions have been documented.     ECHOCARDIOGRAM  ECHOCARDIOGRAM COMPLETE 02/26/2023  Narrative ECHOCARDIOGRAM REPORT    Patient Name:   KEMORAH AMELL Date of Exam: 02/26/2023 Medical Rec #:  [id number]       Height:       62.0 in Accession #:    [id number]      Weight:       237.0 lb Date of Birth:  [birth year]      BSA:          2.055 m Patient Age:    46 years        BP:           125/85 mmHg Patient Gender: F               HR:           118 bpm. Exam Location:  Inpatient  Procedure: 2D Echo, Cardiac Doppler, Color Doppler and Intracardiac Opacification Agent  Indications:    CHF  History:        Patient has no prior history of Echocardiogram examinations. CHF. Asthma.  Sonographer:    Milbert Coulter Referring Phys: [id number] John Giovanni   Sonographer Comments: Patient  is obese. Image acquisition challenging due to patient body habitus. IMPRESSIONS   1. Left ventricular ejection fraction, by best estimation, is 20 to 25%. The left ventricle has severely decreased function. Left ventricular endocardial border not optimally defined to evaluate regional wall motion despite contrast administration. The left ventricular internal cavity size was mildly to moderately dilated. Left ventricular diastolic parameters are indeterminate. 2. Right ventricular systolic function is hyperdynamic. The right ventricular size is normal. Tricuspid regurgitation signal is inadequate for assessing PA pressure. 3. The mitral valve is normal in structure. Mild to moderate mitral valve regurgitation. No evidence of mitral stenosis. 4. The aortic valve was not well visualized. Aortic valve regurgitation is not visualized. No aortic stenosis is present.  Comparison(s): No prior Echocardiogram.  Conclusion(s)/Recommendation(s): Reaching out to primary team.  FINDINGS Left Ventricle: Left ventricular ejection fraction, by estimation, is 20 to 25%. The  left ventricle has severely decreased function. Left ventricular endocardial border not optimally defined to evaluate regional wall motion. Definity contrast agent was given IV to delineate the left ventricular endocardial borders. The left ventricular internal cavity size was mildly to moderately dilated. Suboptimal image quality limits for assessment of left ventricular hypertrophy. Left ventricular diastolic parameters are indeterminate.  Right Ventricle: The right ventricular size is normal. No increase in right ventricular wall thickness. Right ventricular systolic function is hyperdynamic. Tricuspid regurgitation signal is inadequate for assessing PA pressure.  Left Atrium: Left atrial size was normal in size.  Right Atrium: Right atrial size was normal in size.  Pericardium: Trivial pericardial effusion is present. The pericardial  effusion is posterior to the left ventricle.  Mitral Valve: The mitral valve is normal in structure. Mild to moderate mitral valve regurgitation. No evidence of mitral valve stenosis.  Tricuspid Valve: The tricuspid valve is not well visualized. Tricuspid valve regurgitation is not demonstrated. No evidence of tricuspid stenosis.  Aortic Valve: The aortic valve was not well visualized. Aortic valve regurgitation is not visualized. No aortic stenosis is present.  Pulmonic Valve: The pulmonic valve was normal in structure. Pulmonic valve regurgitation is not visualized. No evidence of pulmonic stenosis.  Aorta: The aortic root is normal in size and structure.  IAS/Shunts: No atrial level shunt detected by color flow Doppler.   LEFT VENTRICLE PLAX 2D LVIDd:         5.70 cm LVIDs:         5.50 cm LV PW:         1.10 cm LV IVS:        1.20 cm LVOT diam:     2.10 cm LVOT Area:     3.46 cm   RIGHT VENTRICLE RV Basal diam:  3.60 cm RV Mid diam:    2.80 cm RV S prime:     14.50 cm/s TAPSE (M-mode): 2.1 cm  LEFT ATRIUM             Index        RIGHT ATRIUM           Index LA diam:        4.60 cm 2.24 cm/m   RA Area:     19.30 cm LA Vol (A2C):   56.7 ml 27.60 ml/m  RA Volume:   60.70 ml  29.54 ml/m LA Vol (A4C):   56.1 ml 27.30 ml/m LA Biplane Vol: 63.5 ml 30.90 ml/m  SHUNTS Systemic Diam: 2.10 cm  Riley Lam MD Electronically signed by Riley Lam MD Signature Date/Time: 02/26/2023/3:25:57 PM    Final              Patient Profile     47 y.o. female with history of obesity and asthma who presented with progress SOB, orthopnea and LE edema found to have newly diagnosed nonischemic CM with LVEF 20-25% for which AHF was consulted.  Assessment & Plan    #Acute Systolic HF: #Nonischemic CM: -Patient presented with worsening SOB, orthopnea and LE edema found to have BNP 919.3 consistent with newly diagnosed HF -TTE with LVEF 20-25% (appears ~30% on  my review on non-definity images), with normal/hyperdynamic RV function -LHC with normal coronaries -RHC with RAP 13, PA 59/42 (48), PCWP 33, CO 6.2/CI 2.9, RVR 2.4 WU, PA sat 73%  -Will plan for CMR for further work-up of NICM -Significant, brisk UOP to lasix gtt last night (net negative 4.5L; wt down 10lbs) -Given significant cramps and brisk  response to lasix gtt with bump in Cr/BUN and drop in Na, will change back to lasix 80mg  IV BID dosing for now. Will also repeat BMET to ensure numbers accurate given hemolyzed sample -Continue spiro 25mg  daily -Continue losartan 12.5mg  daily -Continue dig 0.125mg  daily -Not on BB due to acute decompensation; likely plan for bisoprolol given asthma -Okay to change unna boots to compression socks as patient demanding to have them removed  #Acute Asthma Exacerbation: -Per IM  #Morbid Obesity: -Consider [id number] as outpatient      For questions or updates, please contact [facility] Please consult [email or web address] for contact info under        Signed, Meriam Sprague, MD  03/02/2023, 10:16 AM

## 2023-03-02 NOTE — Progress Notes (Signed)
Rounding Note    Patient Name: Carolyn Oconnor Date of Encounter: 03/02/2023  [facility] Cardiologist: None   Subjective   Feels much better today. Cramps resolved. LE edema markedly improved.   Cr improved 1.45>1.23 BNP 251>152 Negative 740; 1700 recorded UOP Wt 251>248lbs  RHC with elevated filling pressures but preserved CO/CI LHC with clean coronaries Net negatuve 4.3L overnight Wt down 261>251lbs  Inpatient Medications    Scheduled Meds:  digoxin  0.125 mg Oral Daily   enoxaparin (LOVENOX) injection  40 mg Subcutaneous Q24H   furosemide  80 mg Intravenous BID   guaiFENesin  1,200 mg Oral BID   ipratropium  0.5 mg Nebulization BID   ivabradine  2.5 mg Oral BID WC   levalbuterol  0.63 mg Nebulization BID   losartan  12.5 mg Oral Daily   montelukast  10 mg Oral QHS   predniSONE  60 mg Oral Daily   sodium chloride flush  3 mL Intravenous Q12H   sodium chloride flush  3 mL Intravenous Q12H   spironolactone  25 mg Oral Daily   Continuous Infusions:  sodium chloride     PRN Meds: sodium chloride, acetaminophen **OR** acetaminophen, ipratropium, levalbuterol, methocarbamol, sodium chloride flush   Vital Signs    Vitals:   03/02/23 0449 03/02/23 0818 03/02/23 0927 03/02/23 1648  BP: (!) 108/51  (!) 130/94 104/60  Pulse: (!) 110  (!) 117 (!) 106  Resp: 20   20  Temp: 97.9 F (36.6 C)  (!) 97.1 F (36.2 C) 98.7 F (37.1 C)  TempSrc: Oral  Oral Oral  SpO2:  98% 98% 98%  Weight:      Height:        Intake/Output Summary (Last 24 hours) at 03/02/2023 1954 Last data filed at 03/02/2023 1700 Gross per 24 hour  Intake 745.9 ml  Output 3500 ml  Net -2754.1 ml       03/02/2023    4:40 AM 03/01/2023    3:48 AM 02/28/2023    4:15 AM  Last 3 Weights  Weight (lbs) 251 lb 9.6 oz 261 lb 1.6 oz 265 lb 14.4 oz  Weight (kg) 114.125 kg 118.434 kg 120.611 kg      Telemetry    NSR/ST - Personally Reviewed  ECG    No new tracing today - Personally  Reviewed  Physical Exam   GEN: NAD Neck: JVD difficult to assess due to body habitus Cardiac: Tachycardic, regular, no murmurs Respiratory: Clear bilaterally GI: Soft, nontender, non-distended  MS: Trace pedal edema, warm Neuro:  Nonfocal  Psych: Normal affect   Labs    High Sensitivity Troponin:   Recent Labs  Lab 02/26/23 0106 02/26/23 0403  TROPONINIHS 16 16      Chemistry Recent Labs  Lab 02/28/23 0944 03/01/23 0051 03/01/23 0840 03/01/23 1220 03/02/23 0105 03/02/23 0600 03/02/23 1048  NA 136 136   < > 137 132*  --  135  K 3.5 4.7   < > 3.8 5.2* 4.3 4.1  CL 104 103  --  102 93*  --  93*  CO2 22 22  --  23 22  --  23  GLUCOSE 106* 97  --  108* 104*  --  96  BUN 20 23*  --  19 34*  --  38*  CREATININE 0.96 1.08*  --  1.04* 1.29*  --  1.45*  CALCIUM 8.8* 9.6  --  9.4 10.4*  --  9.9  MG 2.3 2.5*  --   --  2.6* 2.5*  --   PROT 6.4* 7.6  --   --  8.0  --   --   ALBUMIN 3.4* 4.0  --  3.8 4.4  --   --   AST 19 24  --   --  32  --   --   ALT 19 24  --   --  35  --   --   ALKPHOS 37* 43  --   --  50  --   --   BILITOT 0.8 0.8  --   --  0.6  --   --   GFRNONAA >60 >60  --  >60 52*  --  45*  ANIONGAP 10 11  --  12 17*  --  19*   < > = values in this interval not displayed.     Lipids No results for input(s): "CHOL", "TRIG", "HDL", "LABVLDL", "LDLCALC", "CHOLHDL" in the last 168 hours.  Hematology Recent Labs  Lab 02/28/23 0106 03/01/23 0051 03/01/23 0840 03/01/23 0844 03/02/23 0105  WBC 11.4* 13.9*  --   --  14.3*  RBC 4.72 5.59*  --   --  5.87*  HGB 13.0 15.5* 15.0 15.6*  16.0* 16.4*  HCT 40.2 46.7* 44.0 46.0  47.0* 47.8*  MCV 85.2 83.5  --   --  81.4  MCH 27.5 27.7  --   --  27.9  MCHC 32.3 33.2  --   --  34.3  RDW 14.6 14.6  --   --  14.4  PLT 298 357  --   --  433*    Thyroid  Recent Labs  Lab 03/01/23 1220  TSH 2.339     BNP Recent Labs  Lab 02/28/23 0106 03/01/23 0051 03/02/23 1048  BNP 432.8* 421.0* 251.6*     DDimer No results  for input(s): "DDIMER" in the last 168 hours.   Radiology    DG CHEST PORT 1 VIEW  Result Date: 03/02/2023 CLINICAL DATA:  47 year old female with history of shortness of breath. EXAM: PORTABLE CHEST 1 VIEW COMPARISON:  Chest x-ray 03/01/2023. FINDINGS: Lung volumes are normal. No consolidative airspace disease. No pleural effusions. Diffuse peribronchial cuffing and interstitial prominence. No pneumothorax. Cephalization of the pulmonary vasculature. Mild cardiomegaly. Upper mediastinal contours are within normal limits allowing for patient rotation to the right. IMPRESSION: 1. Similar appearance of the chest suggestive of resolving congestive heart failure, as above. Electronically Signed   By: Trudie Reed M.D.   On: 03/02/2023 10:08   CARDIAC CATHETERIZATION  Result Date: 03/01/2023   The left ventricular ejection fraction is less than 25% by visual estimate. Findings: Ao = 133/93 LV =  134/35 RA = 13 RV = 65/24 PA = 59/42 (48) PCW = 33 Fick cardiac output/index = 6.2/2.9 Thermo CO/CI = 6.3/2.9 PVR = 2.4 WU Ao sat = 95% PA sat = 73%, 73% PAPi = 1.3 Assessment: 1. Severe NICM EF ~20% 2. Normal coronary arteries 3. Marked volume overload 4. Preserved cardiac output Plan/Discussion: She remains tenuous. Will need further diuresis and titration of GDMT. Plan cMRI next week. Arvilla Meres, MD 9:38 AM  DG CHEST PORT 1 VIEW  Result Date: 03/01/2023 CLINICAL DATA:  161096 SOB (shortness of breath) on exertion [id number] EXAM: PORTABLE CHEST 1 VIEW COMPARISON:  Radiograph 02/28/2023 FINDINGS: Unchanged enlarged cardiac silhouette. There are moderate social opacities, similar to prior exam. No new airspace disease. No large effusion or evidence of pneumothorax. Bones are unchanged. IMPRESSION: Unchanged cardiomegaly with pulmonary vascular  congestion/interstitial edema. No new airspace disease. Electronically Signed   By: Caprice Renshaw M.D.   On: 03/01/2023 08:50    Cardiac Studies   Cardiac Studies &  Procedures   CARDIAC CATHETERIZATION  CARDIAC CATHETERIZATION 03/01/2023  Narrative   The left ventricular ejection fraction is less than 25% by visual estimate.  Findings:  Ao = 133/93 LV =  134/35 RA = 13 RV = 65/24 PA = 59/42 (48) PCW = 33 Fick cardiac output/index = 6.2/2.9 Thermo CO/CI = 6.3/2.9 PVR = 2.4 WU Ao sat = 95% PA sat = 73%, 73% PAPi = 1.3  Assessment: 1. Severe NICM EF ~20% 2. Normal coronary arteries 3. Marked volume overload 4. Preserved cardiac output  Plan/Discussion:  She remains tenuous. Will need further diuresis and titration of GDMT. Plan cMRI next week.  Arvilla Meres, MD 9:38 AM  Findings Coronary Findings Diagnostic  Dominance: Right  Left Main Vessel is angiographically normal.  Left Anterior Descending Vessel is angiographically normal.  Left Circumflex Vessel is angiographically normal.  Right Coronary Artery Vessel is angiographically normal.  Intervention  No interventions have been documented.    ECHOCARDIOGRAM  ECHOCARDIOGRAM COMPLETE 02/26/2023  Narrative ECHOCARDIOGRAM REPORT    Patient Name:   Carolyn Oconnor Date of Exam: 02/26/2023 Medical Rec #:  [id number]       Height:       62.0 in Accession #:    [id number]      Weight:       237.0 lb Date of Birth:  1975/12/29      BSA:          2.055 m Patient Age:    46 years        BP:           125/85 mmHg Patient Gender: F               HR:           118 bpm. Exam Location:  Inpatient  Procedure: 2D Echo, Cardiac Doppler, Color Doppler and Intracardiac Opacification Agent  Indications:    CHF  History:        Patient has no prior history of Echocardiogram examinations. CHF. Asthma.  Sonographer:    Milbert Coulter Referring Phys: [id number] John Giovanni   Sonographer Comments: Patient is obese. Image acquisition challenging due to patient body habitus. IMPRESSIONS   1. Left ventricular ejection fraction, by best estimation, is 20 to 25%. The left  ventricle has severely decreased function. Left ventricular endocardial border not optimally defined to evaluate regional wall motion despite contrast administration. The left ventricular internal cavity size was mildly to moderately dilated. Left ventricular diastolic parameters are indeterminate. 2. Right ventricular systolic function is hyperdynamic. The right ventricular size is normal. Tricuspid regurgitation signal is inadequate for assessing PA pressure. 3. The mitral valve is normal in structure. Mild to moderate mitral valve regurgitation. No evidence of mitral stenosis. 4. The aortic valve was not well visualized. Aortic valve regurgitation is not visualized. No aortic stenosis is present.  Comparison(s): No prior Echocardiogram.  Conclusion(s)/Recommendation(s): Reaching out to primary team.  FINDINGS Left Ventricle: Left ventricular ejection fraction, by estimation, is 20 to 25%. The left ventricle has severely decreased function. Left ventricular endocardial border not optimally defined to evaluate regional wall motion. Definity contrast agent was given IV to delineate the left ventricular endocardial borders. The left ventricular internal cavity size was mildly to moderately dilated. Suboptimal image quality limits for assessment of left ventricular hypertrophy.  Left ventricular diastolic parameters are indeterminate.  Right Ventricle: The right ventricular size is normal. No increase in right ventricular wall thickness. Right ventricular systolic function is hyperdynamic. Tricuspid regurgitation signal is inadequate for assessing PA pressure.  Left Atrium: Left atrial size was normal in size.  Right Atrium: Right atrial size was normal in size.  Pericardium: Trivial pericardial effusion is present. The pericardial effusion is posterior to the left ventricle.  Mitral Valve: The mitral valve is normal in structure. Mild to moderate mitral valve regurgitation. No evidence of mitral  valve stenosis.  Tricuspid Valve: The tricuspid valve is not well visualized. Tricuspid valve regurgitation is not demonstrated. No evidence of tricuspid stenosis.  Aortic Valve: The aortic valve was not well visualized. Aortic valve regurgitation is not visualized. No aortic stenosis is present.  Pulmonic Valve: The pulmonic valve was normal in structure. Pulmonic valve regurgitation is not visualized. No evidence of pulmonic stenosis.  Aorta: The aortic root is normal in size and structure.  IAS/Shunts: No atrial level shunt detected by color flow Doppler.   LEFT VENTRICLE PLAX 2D LVIDd:         5.70 cm LVIDs:         5.50 cm LV PW:         1.10 cm LV IVS:        1.20 cm LVOT diam:     2.10 cm LVOT Area:     3.46 cm   RIGHT VENTRICLE RV Basal diam:  3.60 cm RV Mid diam:    2.80 cm RV S prime:     14.50 cm/s TAPSE (M-mode): 2.1 cm  LEFT ATRIUM             Index        RIGHT ATRIUM           Index LA diam:        4.60 cm 2.24 cm/m   RA Area:     19.30 cm LA Vol (A2C):   56.7 ml 27.60 ml/m  RA Volume:   60.70 ml  29.54 ml/m LA Vol (A4C):   56.1 ml 27.30 ml/m LA Biplane Vol: 63.5 ml 30.90 ml/m  SHUNTS Systemic Diam: 2.10 cm  Riley Lam MD Electronically signed by Riley Lam MD Signature Date/Time: 02/26/2023/3:25:57 PM    Final              Patient Profile     47 y.o. female with history of obesity and asthma who presented with progress SOB, orthopnea and LE edema found to have newly diagnosed nonischemic CM with LVEF 20-25% for which AHF was consulted.  Assessment & Plan    #Acute Systolic HF: #Nonischemic CM: -Patient presented with worsening SOB, orthopnea and LE edema found to have BNP 919.3 consistent with newly diagnosed HF -TTE with LVEF 20-25% (appears ~30% on my review on non-definity images), with normal/hyperdynamic RV function -LHC with normal coronaries -RHC with RAP 13, PA 59/42 (48), PCWP 33, CO 6.2/CI 2.9, RVR 2.4 WU,  PA sat 73%  -Will plan for CMR for further work-up of NICM -Weight significantly improved (down 21lbs) and BUN rising -Will change to lasix 80mg  PO daily tomorrow -Continue spiro 25mg  daily -Continue losartan 12.5mg  daily -Continue dig 0.125mg  daily -Not on BB due to acute decompensation; likely plan for bisoprolol given asthma -Changed from unna boots to compression stockings given patient preference and significant improvement in LE edema  #Acute Asthma Exacerbation: -Per IM  #Morbid Obesity: -Consider [id number] as outpatient  For questions or updates, please contact [facility] Please consult [email or web address] for contact info under        Signed, Meriam Sprague, MD  03/02/2023, 7:54 PM

## 2023-03-02 NOTE — Progress Notes (Signed)
Patient had 7 beats of V tach. Asymptomatic. Patient asleep at time. VSS. Patient complains of having pain/muscle cramps in side and chest. Patient said that she is making herself cough to relive the pain. Advised not make herself cough as it only cause chest muscle soreness and strain. Robaxin given previously for muscle spasms. On call cardiologist paged.

## 2023-03-03 ENCOUNTER — Inpatient Hospital Stay (HOSPITAL_COMMUNITY): Payer: Self-pay

## 2023-03-03 LAB — CBC WITH DIFFERENTIAL/PLATELET
Abs Immature Granulocytes: 0.04 10*3/uL (ref 0.00–0.07)
Basophils Absolute: 0 10*3/uL (ref 0.0–0.1)
Basophils Relative: 0 %
Eosinophils Absolute: 0 10*3/uL (ref 0.0–0.5)
Eosinophils Relative: 0 %
HCT: 46 % (ref 36.0–46.0)
Hemoglobin: 15.4 g/dL — ABNORMAL HIGH (ref 12.0–15.0)
Immature Granulocytes: 0 %
Lymphocytes Relative: 16 %
Lymphs Abs: 2 10*3/uL (ref 0.7–4.0)
MCH: 27.5 pg (ref 26.0–34.0)
MCHC: 33.5 g/dL (ref 30.0–36.0)
MCV: 82.1 fL (ref 80.0–100.0)
Monocytes Absolute: 1 10*3/uL (ref 0.1–1.0)
Monocytes Relative: 8 %
Neutro Abs: 9.2 10*3/uL — ABNORMAL HIGH (ref 1.7–7.7)
Neutrophils Relative %: 76 %
Platelets: 393 10*3/uL (ref 150–400)
RBC: 5.6 MIL/uL — ABNORMAL HIGH (ref 3.87–5.11)
RDW: 14.4 % (ref 11.5–15.5)
WBC: 12.1 10*3/uL — ABNORMAL HIGH (ref 4.0–10.5)
nRBC: 0 % (ref 0.0–0.2)

## 2023-03-03 LAB — COMPREHENSIVE METABOLIC PANEL
ALT: 31 U/L (ref 0–44)
AST: 29 U/L (ref 15–41)
Albumin: 3.8 g/dL (ref 3.5–5.0)
Alkaline Phosphatase: 44 U/L (ref 38–126)
Anion gap: 12 (ref 5–15)
BUN: 41 mg/dL — ABNORMAL HIGH (ref 6–20)
CO2: 25 mmol/L (ref 22–32)
Calcium: 9.3 mg/dL (ref 8.9–10.3)
Chloride: 95 mmol/L — ABNORMAL LOW (ref 98–111)
Creatinine, Ser: 1.23 mg/dL — ABNORMAL HIGH (ref 0.44–1.00)
GFR, Estimated: 55 mL/min — ABNORMAL LOW (ref >60)
Glucose, Bld: 122 mg/dL — ABNORMAL HIGH (ref 70–99)
Potassium: 4 mmol/L (ref 3.5–5.1)
Sodium: 132 mmol/L — ABNORMAL LOW (ref 135–145)
Total Bilirubin: 0.9 mg/dL (ref 0.3–1.2)
Total Protein: 7.4 g/dL (ref 6.5–8.1)

## 2023-03-03 LAB — BRAIN NATRIURETIC PEPTIDE: B Natriuretic Peptide: 152 pg/mL — ABNORMAL HIGH (ref 0.0–100.0)

## 2023-03-03 LAB — MAGNESIUM: Magnesium: 2.7 mg/dL — ABNORMAL HIGH (ref 1.7–2.4)

## 2023-03-03 LAB — PHOSPHORUS: Phosphorus: 6.7 mg/dL — ABNORMAL HIGH (ref 2.5–4.6)

## 2023-03-03 MED ORDER — PREDNISONE 50 MG PO TABS
50.0000 mg | ORAL_TABLET | Freq: Every day | ORAL | Status: DC
  Administered 2023-03-04: 50 mg via ORAL
  Filled 2023-03-03: qty 1

## 2023-03-03 MED ORDER — FUROSEMIDE 40 MG PO TABS
80.0000 mg | ORAL_TABLET | Freq: Every day | ORAL | Status: DC
  Filled 2023-03-03: qty 2

## 2023-03-03 NOTE — Progress Notes (Signed)
Pt had 40 beats (16sec) of asymptomatic VTach. Patient was asleep. Vital signs stable. On call MD notified.

## 2023-03-03 NOTE — Progress Notes (Signed)
PROGRESS NOTE    Carolyn Oconnor  ZOX:[id number] DOB: Jul 04, 1976 DOA: 02/26/2023 PCP: System, Provider Not In   Brief Narrative:  The patient Carolyn Oconnor is a 47 y.o. female with medical history significant of asthma, allergic rhinitis, chronic bronchitis, obesity presented to ED with complaints of shortness of breath, cough, wheezing, and bilateral lower extremity edema.  Patient got admitted for suspected CHF exacerbation, asthma exacerbation.  He has been initiated on diuresis with breathing treatments patient is reporting some improvement.  Although she is on room air patient still has conversational dyspnea and tachycardia even at rest.  Further workup reveals acute systolic CHF and cardiology consulted and took the patient right and left heart cath today.     Cath was done and showed markedly elevated filling pressures on the right heart cath this morning and also showed normal coronaries however she is shown to have severe NICM EF ~20% along with marked volume overload and preserved cardiac output. Cardiology was diuresing her given her volume overload and starting her on IV Lasix drip at 12 mg/hr outpatient had an adequate response and complained of significant cramping and was for her Lasix drip to be stopped so this was discontinued and cardiology is changed her back to IV Lasix twice daily and now to po 80 mg Daily.  Assessment and Plan:  Acute Systolic CHF Tachycardia NICM -Patient with no documented history of CHF or prior echo results in the chart presenting with 1 month history of progressively worsening dyspnea, orthopnea, bilateral lower extremity edema, and weight gain.   -BNP went from 919.3 -> 868.7 -> 432.8 and is now 421.0 -> 256.1 -> 152.0 -Chest x-ray showing central vascular congestion, BNP 919 -IV Diuresis changed to po this AM  Intake/Output Summary (Last 24 hours) at 03/03/2023 1406 Last data filed at 03/03/2023 1320 Gross per 24 hour  Intake 730 ml  Output 2050  ml  Net -1320 ml  -Patient is  -16.335 Liters since Admission  -Echocardiogram ordered and showed "Left ventricular ejection fraction, by best estimation, is 20 to 25%. The left ventricle has severely decreased function. Left ventricular  endocardial border not optimally defined to evaluate regional wall motion despite contrast administration. The left ventricular internal cavity size was mildly to moderately dilated. Left ventricular diastolic parameters are indeterminate. Right ventricular systolic function is hyperdynamic. The right ventricular size is normal. Tricuspid regurgitation signal is inadequate for assessing PA pressure. The mitral valve is normal in structure. Mild to moderate mitral valve regurgitation. No evidence of mitral stenosis. The aortic valve was not well visualized. Aortic valve regurgitation is not visualized. No aortic stenosis is present." -Fluid and salt restriction to be continued at 1200 mL -Cardiology planning on Cardiac MRI for further work-up of NICM -They started the patient on spironolactone 12.5 mg p.o. daily, losartan 12.5 mg p.o. daily given that her blood pressure is too soft for Entresto at this time and also started digoxin 0.125 mg p.o. daily; Now Cardiology has increased Spironolactone to 25 mg po Daily added losartan 12.5 mg p.o. daily -Currently the patient has not been started on a beta-blocker given her acute decompensation but if it is added in the future they are going to consider bisoprolol given the patient's asthma -UDS negative  -TSH was 2.339 -Cardiology consulted and a right and left heart cath was done and it showed severe nonischemic cardiomyopathy with an EF of around 20%, normal coronaries, marked volume overload but preserved cardiac output and markedly elevated filling pressures  on right heart cath.  Cardiology had initiated on Lasix drip but changed to IV and now po tomorrow -Given her tachycardia cardiology started her on Corlanor 2.5 mL  p.o. twice daily   Acute Asthma Exacerbation -Overall wheezing has improved, however still has clearly conversational dyspnea and tachycardia -Continue prednisone but will start tapering and go to 50 mg in the AM,  -C/w DuoNebs every 6 hours and Singulair -SpO2: 98 % O2 Flow Rate (L/min): 2 L/min -Continue supplemental oxygen via nasal cannula wean O2 as tolerated Continuous pulse oximetry maintain O2 saturations greater than 90% -Will add guaifenesin 1200 mg p.o. twice daily flutter valve, incentive spirometry -Respiratory virus panel by PCR was negative for influenza A and B as well as respiratory syncytial virus and SARS-CoV-2 -Have added Xopenex and Atrovent twice daily as well as every 6 as needed for wheezing and shortness of breath -Patient will need ambulatory home O2 screen prior to discharge as well as repeat chest x-ray -Repeat CXR this AM done and showed "Cardiac silhouette mildly enlarged. Vascular prominence and cephalization noted on the previous day's exam is similar. No lung consolidation. No convincing pleural effusion or  pneumothorax."   AKI -In the setting of Lasix and diuresis.  BUN/Cr Trend: Recent Labs  Lab 02/27/23 0100 02/28/23 0944 03/01/23 0051 03/01/23 1220 03/02/23 0105 03/02/23 1048 03/03/23 0050  BUN 18 20 23* 19 34* 38* 41*  CREATININE 0.94 0.96 1.08* 1.04* 1.29* 1.45* 1.23*  -Lasix drip is now stopping and Cardiology transitioned to IV Lasix BID on 03/02/23 and now going to po Furosemide 80 mg po Daily -Avoid Nephrotoxic Medications, Contrast Dyes if able, Hypotension and Dehydration to Ensure Adequate Renal Perfusion and will need to Renally Adjust Meds -Continue to Monitor and Trend Renal Function carefully and repeat CMP in the AM    Leukocytosis -WBC Trend: -In the setting of Steroid Demargination and will wean now Recent Labs  Lab 02/26/23 0106 02/27/23 0100 02/28/23 0106 03/01/23 0051 03/02/23 0105 03/03/23 0050  WBC 9.0 11.1* 11.4*  13.9* 14.3* 12.1*  -Continue to Monitor and Trend and repeat CBC in the AM   Metabolic Acidosis -Mild and improved. CO2 was 21, AG was 9, Chloride Level was 106 and now improved as CO2 is 25, AG is 12, Chloride Level is now 95 -Continue to Monitor and Trend and repeat CMP in the AM   Thrombocytosis, improved -Patient's Platelet Count went from 357 -> 433 -> 393 -Continue to Monitor and Trend and repeat CBC in the AM   Hypoalbuminemia -Patient's Albumin Trend: Recent Labs  Lab 02/27/23 0100 02/28/23 0944 03/01/23 0051 03/01/23 1220 03/02/23 0105 03/03/23 0050  ALBUMIN 3.5 3.4* 4.0 3.8 4.4 3.8  -Continue to Monitor and Trend and repeat CMP in the AM   Morbid Obesity -Complicates overall prognosis and care -Estimated body mass index is 45.38 kg/m as calculated from the following:   Height as of this encounter: 5\' 2"  (1.575 m).   Weight as of this encounter: 112.5 kg.  -Weight Loss and Dietary Counseling given  DVT prophylaxis: Place TED hose Start: 03/02/23 1711 enoxaparin (LOVENOX) injection 40 mg Start: 03/02/23 0800    Code Status: Full Code Family Communication: No family present at bedside  Disposition Plan:  Level of care: Telemetry Cardiac Status is: Inpatient Remains inpatient appropriate because: Needs further Cardiac Clearance and evaluation   Consultants:  Cardiology   Procedures:  ECHOCARDIOGRAM IMPRESSIONS     1. Left ventricular ejection fraction, by best estimation, is 20  to 25%.  The left ventricle has severely decreased function. Left ventricular  endocardial border not optimally defined to evaluate regional wall motion  despite contrast administration. The  left ventricular internal cavity size was mildly to moderately dilated.  Left ventricular diastolic parameters are indeterminate.   2. Right ventricular systolic function is hyperdynamic. The right  ventricular size is normal. Tricuspid regurgitation signal is inadequate  for assessing PA  pressure.   3. The mitral valve is normal in structure. Mild to moderate mitral valve  regurgitation. No evidence of mitral stenosis.   4. The aortic valve was not well visualized. Aortic valve regurgitation  is not visualized. No aortic stenosis is present.   Comparison(s): No prior Echocardiogram.   Conclusion(s)/Recommendation(s): Reaching out to primary team.   FINDINGS   Left Ventricle: Left ventricular ejection fraction, by estimation, is 20  to 25%. The left ventricle has severely decreased function. Left  ventricular endocardial border not optimally defined to evaluate regional  wall motion. Definity contrast agent was  given IV to delineate the left ventricular endocardial borders. The left  ventricular internal cavity size was mildly to moderately dilated.  Suboptimal image quality limits for assessment of left ventricular  hypertrophy. Left ventricular diastolic  parameters are indeterminate.   Right Ventricle: The right ventricular size is normal. No increase in  right ventricular wall thickness. Right ventricular systolic function is  hyperdynamic. Tricuspid regurgitation signal is inadequate for assessing  PA pressure.   Left Atrium: Left atrial size was normal in size.   Right Atrium: Right atrial size was normal in size.   Pericardium: Trivial pericardial effusion is present. The pericardial  effusion is posterior to the left ventricle.   Mitral Valve: The mitral valve is normal in structure. Mild to moderate  mitral valve regurgitation. No evidence of mitral valve stenosis.   Tricuspid Valve: The tricuspid valve is not well visualized. Tricuspid  valve regurgitation is not demonstrated. No evidence of tricuspid  stenosis.   Aortic Valve: The aortic valve was not well visualized. Aortic valve  regurgitation is not visualized. No aortic stenosis is present.   Pulmonic Valve: The pulmonic valve was normal in structure. Pulmonic valve  regurgitation is not  visualized. No evidence of pulmonic stenosis.   Aorta: The aortic root is normal in size and structure.   IAS/Shunts: No atrial level shunt detected by color flow Doppler.     LEFT VENTRICLE  PLAX 2D  LVIDd:         5.70 cm  LVIDs:         5.50 cm  LV PW:         1.10 cm  LV IVS:        1.20 cm  LVOT diam:     2.10 cm  LVOT Area:     3.46 cm     RIGHT VENTRICLE  RV Basal diam:  3.60 cm  RV Mid diam:    2.80 cm  RV S prime:     14.50 cm/s  TAPSE (M-mode): 2.1 cm   LEFT ATRIUM             Index        RIGHT ATRIUM           Index  LA diam:        4.60 cm 2.24 cm/m   RA Area:     19.30 cm  LA Vol (A2C):   56.7 ml 27.60 ml/m  RA Volume:   60.70 ml  29.54 ml/m  LA Vol (A4C):   56.1 ml 27.30 ml/m  LA Biplane Vol: 63.5 ml 30.90 ml/m     SHUNTS  Systemic Diam: 2.10 cm    CARDIAC CATH   The left ventricular ejection fraction is less than 25% by visual estimate.   Findings:   Ao = 133/93 LV =  134/35 RA = 13 RV = 65/24 PA = 59/42 (48) PCW = 33 Fick cardiac output/index = 6.2/2.9 Thermo CO/CI = 6.3/2.9 PVR = 2.4 WU Ao sat = 95% PA sat = 73%, 73% PAPi = 1.3   Assessment: 1. Severe NICM EF ~20% 2. Normal coronary arteries 3. Marked volume overload  4. Preserved cardiac output   Antimicrobials:  Anti-infectives (From admission, onward)    None       Subjective: And examined at bedside think she is doing little bit better and states that she is not short of breath.  Happy that her cramps have improved.  No lightheadedness or dizziness.  No other concerns or complaints at this time  Objective: Vitals:   03/03/23 0443 03/03/23 0755 03/03/23 0815 03/03/23 1027  BP: 119/75 116/84 119/79 104/69  Pulse: 99 87 94 94  Resp: 18 20 16 16   Temp: 98.1 F (36.7 C) 98 F (36.7 C)  98.4 F (36.9 C)  TempSrc: Oral Oral  Oral  SpO2: 97% 97% 98%   Weight: 112.5 kg     Height:        Intake/Output Summary (Last 24 hours) at 03/03/2023 1400 Last data filed at  03/03/2023 1320 Gross per 24 hour  Intake 730 ml  Output 2050 ml  Net -1320 ml   Filed Weights   03/01/23 0348 03/02/23 0440 03/03/23 0443  Weight: 118.4 kg 114.1 kg 112.5 kg   Examination: Physical Exam:  Constitutional: WN/WD obese African-American female in no acute distress appears calm Respiratory: Diminished to auscultation bilaterally, no wheezing, rales, rhonchi or crackles. Normal respiratory effort and patient is not tachypenic. No accessory muscle use.  Unlabored breathing Cardiovascular: RRR, no murmurs / rubs / gallops. S1 and S2 auscultated.  1+ extremity edema Abdomen: Soft, non-tender, distended secondary to body habitus. Bowel sounds positive.  GU: Deferred. Musculoskeletal: No clubbing / cyanosis of digits/nails. No joint deformity upper and lower extremities.   Skin: No rashes, lesions, ulcers, on a skin evaluation. No induration; Warm and dry.  Neurologic: CN 2-12 grossly intact with no focal deficits. Romberg sign and cerebellar reflexes not assessed.  Psychiatric: Normal judgment and insight. Alert and oriented x 3. Normal mood and appropriate affect.   Data Reviewed: I have personally reviewed following labs and imaging studies  CBC: Recent Labs  Lab 02/27/23 0100 02/28/23 0106 03/01/23 0051 03/01/23 0840 03/01/23 0844 03/02/23 0105 03/03/23 0050  WBC 11.1* 11.4* 13.9*  --   --  14.3* 12.1*  NEUTROABS 8.7* 8.8* 10.1*  --   --  10.9* 9.2*  HGB 13.7 13.0 15.5* 15.0 15.6*  16.0* 16.4* 15.4*  HCT 41.4 40.2 46.7* 44.0 46.0  47.0* 47.8* 46.0  MCV 84.8 85.2 83.5  --   --  81.4 82.1  PLT 313 298 357  --   --  433* 393   Basic Metabolic Panel: Recent Labs  Lab 02/28/23 0944 03/01/23 0051 03/01/23 0840 03/01/23 0844 03/01/23 1220 03/02/23 0105 03/02/23 0600 03/02/23 1048 03/03/23 0050  NA 136 136   < > 139  138 137 132*  --  135 132*  K 3.5 4.7   < >  4.2  4.3 3.8 5.2* 4.3 4.1 4.0  CL 104 103  --   --  102 93*  --  93* 95*  CO2 22 22  --   --  23  22  --  23 25  GLUCOSE 106* 97  --   --  108* 104*  --  96 122*  BUN 20 23*  --   --  19 34*  --  38* 41*  CREATININE 0.96 1.08*  --   --  1.04* 1.29*  --  1.45* 1.23*  CALCIUM 8.8* 9.6  --   --  9.4 10.4*  --  9.9 9.3  MG 2.3 2.5*  --   --   --  2.6* 2.5*  --  2.7*  PHOS 4.5 4.9*  --   --  4.4 7.4*  --   --  6.7*   < > = values in this interval not displayed.   GFR: Estimated Creatinine Clearance: 68.5 mL/min (A) (by C-G formula based on SCr of 1.23 mg/dL (H)). Liver Function Tests: Recent Labs  Lab 02/27/23 0100 02/28/23 0944 03/01/23 0051 03/01/23 1220 03/02/23 0105 03/03/23 0050  AST 24 19 24   --  32 29  ALT 20 19 24   --  35 31  ALKPHOS 44 37* 43  --  50 44  BILITOT 0.8 0.8 0.8  --  0.6 0.9  PROT 7.3 6.4* 7.6  --  8.0 7.4  ALBUMIN 3.5 3.4* 4.0 3.8 4.4 3.8   No results for input(s): "LIPASE", "AMYLASE" in the last 168 hours. No results for input(s): "AMMONIA" in the last 168 hours. Coagulation Profile: No results for input(s): "INR", "PROTIME" in the last 168 hours. Cardiac Enzymes: No results for input(s): "CKTOTAL", "CKMB", "CKMBINDEX", "TROPONINI" in the last 168 hours. BNP (last 3 results) No results for input(s): "PROBNP" in the last 8760 hours. HbA1C: No results for input(s): "HGBA1C" in the last 72 hours. CBG: No results for input(s): "GLUCAP" in the last 168 hours. Lipid Profile: No results for input(s): "CHOL", "HDL", "LDLCALC", "TRIG", "CHOLHDL", "LDLDIRECT" in the last 72 hours. Thyroid Function Tests: Recent Labs    03/01/23 1220  TSH 2.339   Anemia Panel: No results for input(s): "VITAMINB12", "FOLATE", "FERRITIN", "TIBC", "IRON", "RETICCTPCT" in the last 72 hours. Sepsis Labs: No results for input(s): "PROCALCITON", "LATICACIDVEN" in the last 168 hours.  Recent Results (from the past 240 hour(s))  Resp panel by RT-PCR (RSV, Flu A&B, Covid) Anterior Nasal Swab     Status: None   Collection Time: 02/26/23  1:15 AM   Specimen: Anterior Nasal Swab   Result Value Ref Range Status   SARS Coronavirus 2 by RT PCR NEGATIVE NEGATIVE Final   Influenza A by PCR NEGATIVE NEGATIVE Final   Influenza B by PCR NEGATIVE NEGATIVE Final    Comment: (NOTE) The Xpert Xpress SARS-CoV-2/FLU/RSV plus assay is intended as an aid in the diagnosis of influenza from Nasopharyngeal swab specimens and should not be used as a sole basis for treatment. Nasal washings and aspirates are unacceptable for Xpert Xpress SARS-CoV-2/FLU/RSV testing.  Fact Sheet for Patients: BloggerCourse.com  Fact Sheet for Healthcare Providers: SeriousBroker.it  This test is not yet approved or cleared by the [place] FDA and has been authorized for detection and/or diagnosis of SARS-CoV-2 by FDA under an Emergency Use Authorization (EUA). This EUA will remain in effect (meaning this test can be used) for the duration of the COVID-19 declaration under Section 564(b)(1) of the Act, 21 U.S.C.  section 360bbb-3(b)(1), unless the authorization is terminated or revoked.     Resp Syncytial Virus by PCR NEGATIVE NEGATIVE Final    Comment: (NOTE) Fact Sheet for Patients: BloggerCourse.com  Fact Sheet for Healthcare Providers: SeriousBroker.it  This test is not yet approved or cleared by the [place] FDA and has been authorized for detection and/or diagnosis of SARS-CoV-2 by FDA under an Emergency Use Authorization (EUA). This EUA will remain in effect (meaning this test can be used) for the duration of the COVID-19 declaration under Section 564(b)(1) of the Act, 21 U.S.C. section 360bbb-3(b)(1), unless the authorization is terminated or revoked.  Performed at [facility], [address] [place], [place] [address]   Respiratory (~20 pathogens) panel by PCR     Status: None   Collection Time: 02/27/23  1:51 PM   Specimen: Nasopharyngeal Swab; Respiratory   Result Value Ref Range Status   Adenovirus NOT DETECTED NOT DETECTED Final   Coronavirus 229E NOT DETECTED NOT DETECTED Final    Comment: (NOTE) The Coronavirus on the Respiratory Panel, DOES NOT test for the novel  Coronavirus (2019 nCoV)    Coronavirus HKU1 NOT DETECTED NOT DETECTED Final   Coronavirus NL63 NOT DETECTED NOT DETECTED Final   Coronavirus OC43 NOT DETECTED NOT DETECTED Final   Metapneumovirus NOT DETECTED NOT DETECTED Final   Rhinovirus / Enterovirus NOT DETECTED NOT DETECTED Final   Influenza A NOT DETECTED NOT DETECTED Final   Influenza B NOT DETECTED NOT DETECTED Final   Parainfluenza Virus 1 NOT DETECTED NOT DETECTED Final   Parainfluenza Virus 2 NOT DETECTED NOT DETECTED Final   Parainfluenza Virus 3 NOT DETECTED NOT DETECTED Final   Parainfluenza Virus 4 NOT DETECTED NOT DETECTED Final   Respiratory Syncytial Virus NOT DETECTED NOT DETECTED Final   Bordetella pertussis NOT DETECTED NOT DETECTED Final   Bordetella Parapertussis NOT DETECTED NOT DETECTED Final   Chlamydophila pneumoniae NOT DETECTED NOT DETECTED Final   Mycoplasma pneumoniae NOT DETECTED NOT DETECTED Final    Comment: Performed at [facility], [address], [place], [place] [address]    Radiology Studies: DG CHEST PORT 1 VIEW  Result Date: 03/03/2023 CLINICAL DATA:  Short of breath. EXAM: PORTABLE CHEST 1 VIEW COMPARISON:  03/02/2023 and older studies. FINDINGS: Cardiac silhouette mildly enlarged. Vascular prominence and cephalization noted on the previous day's exam is similar. No lung consolidation. No convincing pleural effusion or pneumothorax. IMPRESSION: 1. No significant change from the previous day's chest radiograph. Mild residual congestive heart failure suspected. Electronically Signed   By: Amie Portland M.D.   On: 03/03/2023 08:17   DG CHEST PORT 1 VIEW  Result Date: 03/02/2023 CLINICAL DATA:  47 year old female with history of shortness of breath. EXAM: PORTABLE CHEST 1 VIEW  COMPARISON:  Chest x-ray 03/01/2023. FINDINGS: Lung volumes are normal. No consolidative airspace disease. No pleural effusions. Diffuse peribronchial cuffing and interstitial prominence. No pneumothorax. Cephalization of the pulmonary vasculature. Mild cardiomegaly. Upper mediastinal contours are within normal limits allowing for patient rotation to the right. IMPRESSION: 1. Similar appearance of the chest suggestive of resolving congestive heart failure, as above. Electronically Signed   By: Trudie Reed M.D.   On: 03/02/2023 10:08    Scheduled Meds:  digoxin  0.125 mg Oral Daily   enoxaparin (LOVENOX) injection  40 mg Subcutaneous Q24H   furosemide  80 mg Oral Daily   guaiFENesin  1,200 mg Oral BID   ivabradine  2.5 mg Oral BID WC  losartan  12.5 mg Oral Daily   montelukast  10 mg Oral QHS   predniSONE  60 mg Oral Daily   sodium chloride flush  3 mL Intravenous Q12H   sodium chloride flush  3 mL Intravenous Q12H   spironolactone  25 mg Oral Daily   Continuous Infusions:  sodium chloride      LOS: 5 days   Marguerita Merles, DO [facility] Available via Epic secure chat 7am-7pm After these hours, please refer to coverage provider listed on [email or web address] 03/03/2023, 2:00 PM

## 2023-03-03 NOTE — Plan of Care (Signed)
  Problem: Education: Goal: Ability to demonstrate management of disease process will improve Outcome: Progressing   Problem: Activity: Goal: Capacity to carry out activities will improve Outcome: Progressing   Problem: Cardiac: Goal: Ability to achieve and maintain adequate cardiopulmonary perfusion will improve Outcome: Progressing   Problem: Activity: Goal: Risk for activity intolerance will decrease Outcome: Progressing   Problem: Nutrition: Goal: Adequate nutrition will be maintained Outcome: Progressing

## 2023-03-04 ENCOUNTER — Other Ambulatory Visit (HOSPITAL_COMMUNITY): Payer: Self-pay

## 2023-03-04 ENCOUNTER — Inpatient Hospital Stay (HOSPITAL_COMMUNITY): Payer: Self-pay

## 2023-03-04 ENCOUNTER — Encounter (HOSPITAL_COMMUNITY): Payer: Self-pay | Admitting: Internal Medicine

## 2023-03-04 ENCOUNTER — Telehealth (HOSPITAL_COMMUNITY): Payer: Self-pay

## 2023-03-04 DIAGNOSIS — N179 Acute kidney failure, unspecified: Secondary | ICD-10-CM

## 2023-03-04 DIAGNOSIS — I428 Other cardiomyopathies: Secondary | ICD-10-CM

## 2023-03-04 LAB — CBC WITH DIFFERENTIAL/PLATELET
Abs Immature Granulocytes: 0.02 10*3/uL (ref 0.00–0.07)
Basophils Absolute: 0 10*3/uL (ref 0.0–0.1)
Basophils Relative: 0 %
Eosinophils Absolute: 0 10*3/uL (ref 0.0–0.5)
Eosinophils Relative: 0 %
HCT: 48.7 % — ABNORMAL HIGH (ref 36.0–46.0)
Hemoglobin: 16 g/dL — ABNORMAL HIGH (ref 12.0–15.0)
Immature Granulocytes: 0 %
Lymphocytes Relative: 17 %
Lymphs Abs: 1.9 10*3/uL (ref 0.7–4.0)
MCH: 27.3 pg (ref 26.0–34.0)
MCHC: 32.9 g/dL (ref 30.0–36.0)
MCV: 83.1 fL (ref 80.0–100.0)
Monocytes Absolute: 0.7 10*3/uL (ref 0.1–1.0)
Monocytes Relative: 7 %
Neutro Abs: 8.3 10*3/uL — ABNORMAL HIGH (ref 1.7–7.7)
Neutrophils Relative %: 76 %
Platelets: 377 10*3/uL (ref 150–400)
RBC: 5.86 MIL/uL — ABNORMAL HIGH (ref 3.87–5.11)
RDW: 14.2 % (ref 11.5–15.5)
WBC: 11 10*3/uL — ABNORMAL HIGH (ref 4.0–10.5)
nRBC: 0 % (ref 0.0–0.2)

## 2023-03-04 LAB — COMPREHENSIVE METABOLIC PANEL
ALT: 30 U/L (ref 0–44)
AST: 25 U/L (ref 15–41)
Albumin: 3.6 g/dL (ref 3.5–5.0)
Alkaline Phosphatase: 42 U/L (ref 38–126)
Anion gap: 13 (ref 5–15)
BUN: 46 mg/dL — ABNORMAL HIGH (ref 6–20)
CO2: 25 mmol/L (ref 22–32)
Calcium: 9.2 mg/dL (ref 8.9–10.3)
Chloride: 93 mmol/L — ABNORMAL LOW (ref 98–111)
Creatinine, Ser: 1.22 mg/dL — ABNORMAL HIGH (ref 0.44–1.00)
GFR, Estimated: 56 mL/min — ABNORMAL LOW (ref >60)
Glucose, Bld: 108 mg/dL — ABNORMAL HIGH (ref 70–99)
Potassium: 4 mmol/L (ref 3.5–5.1)
Sodium: 131 mmol/L — ABNORMAL LOW (ref 135–145)
Total Bilirubin: 0.9 mg/dL (ref 0.3–1.2)
Total Protein: 7.3 g/dL (ref 6.5–8.1)

## 2023-03-04 LAB — MAGNESIUM: Magnesium: 2.7 mg/dL — ABNORMAL HIGH (ref 1.7–2.4)

## 2023-03-04 LAB — PHOSPHORUS: Phosphorus: 5 mg/dL — ABNORMAL HIGH (ref 2.5–4.6)

## 2023-03-04 MED ORDER — GUAIFENESIN ER 600 MG PO TB12
600.0000 mg | ORAL_TABLET | Freq: Two times a day (BID) | ORAL | 0 refills | Status: AC
  Filled 2023-03-04: qty 40, 20d supply, fill #0

## 2023-03-04 MED ORDER — PREDNISONE 10 MG PO TABS
ORAL_TABLET | ORAL | 0 refills | Status: DC
  Filled 2023-03-04: qty 21, 6d supply, fill #0

## 2023-03-04 MED ORDER — SPIRONOLACTONE 25 MG PO TABS
25.0000 mg | ORAL_TABLET | Freq: Every day | ORAL | 0 refills | Status: DC
  Filled 2023-03-04: qty 30, 30d supply, fill #0

## 2023-03-04 MED ORDER — FUROSEMIDE 40 MG PO TABS
40.0000 mg | ORAL_TABLET | Freq: Every day | ORAL | Status: DC
  Administered 2023-03-04: 40 mg via ORAL
  Filled 2023-03-04: qty 1

## 2023-03-04 MED ORDER — GADOBUTROL 1 MMOL/ML IV SOLN
10.0000 mL | Freq: Once | INTRAVENOUS | Status: AC | PRN
  Administered 2023-03-04: 10 mL via INTRAVENOUS

## 2023-03-04 MED ORDER — ACETAMINOPHEN 325 MG PO TABS
650.0000 mg | ORAL_TABLET | Freq: Four times a day (QID) | ORAL | 0 refills | Status: AC | PRN
  Filled 2023-03-04: qty 20, 3d supply, fill #0

## 2023-03-04 MED ORDER — IVABRADINE HCL 5 MG PO TABS
2.5000 mg | ORAL_TABLET | Freq: Two times a day (BID) | ORAL | 0 refills | Status: DC
  Filled 2023-03-04: qty 60, 60d supply, fill #0

## 2023-03-04 MED ORDER — LOSARTAN POTASSIUM 25 MG PO TABS
12.5000 mg | ORAL_TABLET | Freq: Every day | ORAL | 0 refills | Status: DC
  Filled 2023-03-04: qty 15, 30d supply, fill #0

## 2023-03-04 MED ORDER — DIGOXIN 125 MCG PO TABS
0.1250 mg | ORAL_TABLET | Freq: Every day | ORAL | 0 refills | Status: DC
  Filled 2023-03-04: qty 30, 30d supply, fill #0

## 2023-03-04 MED ORDER — FUROSEMIDE 40 MG PO TABS
40.0000 mg | ORAL_TABLET | Freq: Every day | ORAL | 0 refills | Status: DC
  Filled 2023-03-04: qty 30, 30d supply, fill #0

## 2023-03-04 NOTE — Progress Notes (Cosign Needed Addendum)
Advanced Heart Failure Rounding Note  PCP-Cardiologist: None   Subjective:   Weight stable, down 21 lbs overall.   Feels fine this morning. Going for cMRI.  Objective:   R/LHC 5/3: no CAD, RA 13, PA 59/42 (48), PCW 33, LV EDP 35. Fick CO 6.13, CI 2.87. Thermo CO/CI: 6.3/2.9. PAPi 1.3  Weight Range: 112.5 kg Body mass index is 45.38 kg/m.   Vital Signs:   Temp:  [97.6 F (36.4 C)-98.5 F (36.9 C)] 97.6 F (36.4 C) (05/06 0659) Pulse Rate:  [78-100] 89 (05/06 0659) Resp:  [16-18] 18 (05/06 0659) BP: (94-119)/(66-83) 116/73 (05/06 0659) SpO2:  [93 %-99 %] 96 % (05/06 0659) Last BM Date : 03/03/23  Weight change: Filed Weights   03/01/23 0348 03/02/23 0440 03/03/23 0443  Weight: 118.4 kg 114.1 kg 112.5 kg    Intake/Output:   Intake/Output Summary (Last 24 hours) at 03/04/2023 0802 Last data filed at 03/04/2023 0430 Gross per 24 hour  Intake 490 ml  Output 1950 ml  Net -1460 ml      Physical Exam  General:  well appearing.  No respiratory difficulty HEENT: normal Neck: supple. JVD ~8 cm. Carotids 2+ bilat; no bruits. No lymphadenopathy or thyromegaly appreciated. Cor: PMI nondisplaced. Regular rate & rhythm. No rubs, gallops or murmurs. Lungs: clear Abdomen: soft, nontender, nondistended. No hepatosplenomegaly. No bruits or masses. Good bowel sounds. Extremities: no cyanosis, clubbing, rash, trace BLE edema  Neuro: alert & oriented x 3, cranial nerves grossly intact. moves all 4 extremities w/o difficulty. Affect pleasant.   Telemetry  NSR 80s. 11 and 18 beat NSVT (Personally reviewed)   Labs    CBC Recent Labs    03/03/23 0050 03/04/23 0049  WBC 12.1* 11.0*  NEUTROABS 9.2* 8.3*  HGB 15.4* 16.0*  HCT 46.0 48.7*  MCV 82.1 83.1  PLT 393 377   Basic Metabolic Panel Recent Labs    96/04/54 0050 03/04/23 0049  NA 132* 131*  K 4.0 4.0  CL 95* 93*  CO2 25 25  GLUCOSE 122* 108*  BUN 41* 46*  CREATININE 1.23* 1.22*  CALCIUM 9.3 9.2  MG 2.7* 2.7*   PHOS 6.7* 5.0*   Liver Function Tests Recent Labs    03/03/23 0050 03/04/23 0049  AST 29 25  ALT 31 30  ALKPHOS 44 42  BILITOT 0.9 0.9  PROT 7.4 7.3  ALBUMIN 3.8 3.6   No results for input(s): "LIPASE", "AMYLASE" in the last 72 hours. Cardiac Enzymes No results for input(s): "CKTOTAL", "CKMB", "CKMBINDEX", "TROPONINI" in the last 72 hours.  BNP: BNP (last 3 results) Recent Labs    03/01/23 0051 03/02/23 1048 03/03/23 0050  BNP 421.0* 251.6* 152.0*    ProBNP (last 3 results) No results for input(s): "PROBNP" in the last 8760 hours.   D-Dimer No results for input(s): "DDIMER" in the last 72 hours. Hemoglobin A1C No results for input(s): "HGBA1C" in the last 72 hours. Fasting Lipid Panel No results for input(s): "CHOL", "HDL", "LDLCALC", "TRIG", "CHOLHDL", "LDLDIRECT" in the last 72 hours. Thyroid Function Tests Recent Labs    03/01/23 1220  TSH 2.339    Other results:   Imaging    No results found.   Medications:     Scheduled Medications:  digoxin  0.125 mg Oral Daily   enoxaparin (LOVENOX) injection  40 mg Subcutaneous Q24H   furosemide  80 mg Oral Daily   guaiFENesin  1,200 mg Oral BID   ivabradine  2.5 mg Oral BID WC  losartan  12.5 mg Oral Daily   montelukast  10 mg Oral QHS   predniSONE  50 mg Oral Daily   sodium chloride flush  3 mL Intravenous Q12H   sodium chloride flush  3 mL Intravenous Q12H   spironolactone  25 mg Oral Daily    Infusions:  sodium chloride      PRN Medications: sodium chloride, acetaminophen **OR** acetaminophen, ipratropium, levalbuterol, methocarbamol, sodium chloride flush    Patient Profile   47 y/o AAF w/ h/o asthma and obesity admitted w/ new systolic heart failure.   Assessment/Plan  1. Acute Systolic Heart Failure - new diagnosis - Echo EF 20-25% (looks closer to 30-35%), RV hyperdynamic  - R/LHC 5/3: no CAD, RA 13, PA 59/42 (48), PCW 33, LV EDP 35. Fick CO 6.13, CI 2.87. Thermo CO/CI:  6.3/2.9. PAPi 1.3 - Etiology uncertain. BPs have been normotensive. Hs trops negative  - Diuresed well, overall down 21 lbs. Volume has improved. Transition to 40 lasix daily PO.  - cMRI today  - continue spiro 25 mg daily  - continue losartan 12.5 mg daily   - continue digoxin 0.125 mg daily  - Continue corlanor 2.5 mg BID - no ? blocker yet w/ acute decompensation. If added in future, would consider bisoprolol given asthma.   2. Acute Asthma Exacerbation  - per IM    3. Morbid obesity - Body mass index is Body mass index is 45.38 kg/m. - consider GLP1RA  4. NSVT - 11 and 18 beat runs this am - Keep K>4 and Mg >2  Patient stable for discharge today. Has f/u in AHF clinic.   AHF meds at discharge: (Please send meds to [facility] pharmacy) Digoxin 0.125 mg daily Lasix 40 mg daily Ivabradine 2.5 mg BID Losartan 12.5 mg daily Spironolactone 25 mg daily  Length of Stay: 6  Alen Bleacher, NP  03/04/2023, 8:02 AM  Advanced Heart Failure Team Pager [phone] (M-F; 7a - 5p)  Please contact [facility] for night-coverage after hours (5p -7a ) and weekends on [email or web address]

## 2023-03-04 NOTE — Discharge Summary (Signed)
Physician Discharge Summary   Patient: Carolyn Oconnor MRN: [id number] DOB: [birth year]  Admit date:     02/26/2023  Discharge date: 03/04/2023  Discharge Physician: Marguerita Merles, DO   PCP: System, Provider Not In   Recommendations at discharge:   Follow up with PCP within 1-2 weeks and repeat CBC,CMP,Mag,Phos within 1 week With cardiology in outpatient setting within 1 to 2 weeks repeat chest x-ray in 3 to 6 weeks  Discharge Diagnoses: Active Problems:   Acute asthma exacerbation   Acute systolic congestive heart failure (HCC)  Principal Problem (Resolved):   Acute CHF (congestive heart failure) [facility])  Hospital Course: The patient Carolyn Oconnor is a 47 y.o. female with medical history significant of asthma, allergic rhinitis, chronic bronchitis, obesity presented to ED with complaints of shortness of breath, cough, wheezing, and bilateral lower extremity edema.  Patient got admitted for suspected CHF exacerbation, asthma exacerbation.  He has been initiated on diuresis with breathing treatments patient is reporting some improvement.  Although she is on room air patient still has conversational dyspnea and tachycardia even at rest.  Further workup reveals acute systolic CHF and cardiology consulted and took the patient right and left heart cath today.     Cath was done and showed markedly elevated filling pressures on the right heart cath this morning and also showed normal coronaries however she is shown to have severe NICM EF ~20% along with marked volume overload and preserved cardiac output. Cardiology was diuresing her given her volume overload and starting her on IV Lasix drip at 12 mg/hr outpatient had an adequate response and complained of significant cramping and was for her Lasix drip to be stopped so this was discontinued and cardiology is changed her back to IV Lasix twice daily and now to po 80 mg Daily.  **Subsequently Cardiology changed her to p.o. Lasix 40 mg daily and  deemed her stable for discharge.  She ambulated and did not desaturate.  Cardiology is now assisting with her medications given that she will require some prior authorization.  She is medically stable and steroids are being tapered now she will need to follow-up with PCP and cardiology outpatient setting very carefully.  Assessment and Plan: Acute Systolic CHF Tachycardia NICM -Patient with no documented history of CHF or prior echo results in the chart presenting with 1 month history of progressively worsening dyspnea, orthopnea, bilateral lower extremity edema, and weight gain.   -BNP went from 919.3 -> 868.7 -> 432.8 and is now 421.0 -> 256.1 -> 152.0 -Chest x-ray showing central vascular congestion, BNP 919 -IV Diuresis changed to po this AM No intake or output data in the 24 hours ending 03/05/23 2124  -Patient is  -16.935 Liters since Admission  -Echocardiogram ordered and showed "Left ventricular ejection fraction, by best estimation, is 20 to 25%. The left ventricle has severely decreased function. Left ventricular  endocardial border not optimally defined to evaluate regional wall motion despite contrast administration. The left ventricular internal cavity size was mildly to moderately dilated. Left ventricular diastolic parameters are indeterminate. Right ventricular systolic function is hyperdynamic. The right ventricular size is normal. Tricuspid regurgitation signal is inadequate for assessing PA pressure. The mitral valve is normal in structure. Mild to moderate mitral valve regurgitation. No evidence of mitral stenosis. The aortic valve was not well visualized. Aortic valve regurgitation is not visualized. No aortic stenosis is present." -Fluid and salt restriction to be continued at 1200 mL -Cardiology planning on Cardiac MRI for further work-up  of NICM and this was done and still pending read -They started the patient on spironolactone 12.5 mg p.o. daily, losartan 12.5 mg p.o. daily  given that her blood pressure is too soft for Entresto at this time and also started digoxin 0.125 mg p.o. daily; Now Cardiology has increased Spironolactone to 25 mg po Daily added losartan 12.5 mg p.o. daily -Currently the patient has not been started on a beta-blocker given her acute decompensation but if it is added in the future they are going to consider bisoprolol given the patient's asthma -UDS negative  -TSH was 2.339 -Cardiology consulted and a right and left heart cath was done and it showed severe nonischemic cardiomyopathy with an EF of around 20%, normal coronaries, marked volume overload but preserved cardiac output and markedly elevated filling pressures on right heart cath.  Cardiology had initiated on Lasix drip but changed to IV and now po 40 mg and will be discharged -Given her tachycardia cardiology started her on Corlanor 2.5 mL p.o. twice daily -Patient to follow-up with cardiology outpatient setting and cardiology assisting with her medications   Acute Asthma Exacerbation, improved -Overall wheezing has improved, however still has clearly conversational dyspnea and tachycardia -Continue prednisone but will start tapering and go to 50 mg in the AM,  -C/w DuoNebs every 6 hours and Singulair -SpO2: 96 % O2 Flow Rate (L/min): 2 L/min; did not desaturate on ambulatory home O2 screen -Continue supplemental oxygen via nasal cannula wean O2 as tolerated Continuous pulse oximetry maintain O2 saturations greater than 90% -Will add guaifenesin 1200 mg p.o. twice daily flutter valve, incentive spirometry -Respiratory virus panel by PCR was negative for influenza A and B as well as respiratory syncytial virus and SARS-CoV-2 -Have added Xopenex and Atrovent twice daily as well as every 6 as needed for wheezing and shortness of breath -Patient will need ambulatory home O2 screen prior to discharge as well as repeat chest x-ray -Repeat CXR this AM done and showed "Cardiac silhouette  mildly enlarged. Vascular prominence and cephalization noted on the previous day's exam is similar. No lung consolidation. No convincing pleural effusion or  pneumothorax."   AKI -In the setting of Lasix and diuresis.  BUN/Cr Trend: Recent Labs  Lab 02/28/23 0944 03/01/23 0051 03/01/23 1220 03/02/23 0105 03/02/23 1048 03/03/23 0050 03/04/23 0049  BUN 20 23* 19 34* 38* 41* 46*  CREATININE 0.96 1.08* 1.04* 1.29* 1.45* 1.23* 1.22*  -Lasix drip is now stopping and Cardiology transitioned to IV Lasix BID on 03/02/23 and now going to po Furosemide 80 mg po Daily and now further decrease to 40 mg p.o. daily -Avoid Nephrotoxic Medications, Contrast Dyes if able, Hypotension and Dehydration to Ensure Adequate Renal Perfusion and will need to Renally Adjust Meds -Continue to Monitor and Trend Renal Function carefully and repeat CMP in the AM    Leukocytosis -WBC Trend: -In the setting of Steroid Demargination and will wean now Recent Labs  Lab 02/26/23 0106 02/27/23 0100 02/28/23 0106 03/01/23 0051 03/02/23 0105 03/03/23 0050 03/04/23 0049  WBC 9.0 11.1* 11.4* 13.9* 14.3* 12.1* 11.0*  -Continue to Monitor and Trend and repeat CBC in the AM   Metabolic Acidosis -Mild and improved. CO2 was 21, AG was 9, Chloride Level was 106 and now improved as CO2 is 25, AG is 13, Chloride Level is now 93 -Continue to Monitor and Trend and repeat CMP in the AM   Thrombocytosis, improved -Patient's Platelet Count went from 357 -> 433 -> 393 and is now  377 -Continue to Monitor and Trend and repeat CBC in the AM   Hypoalbuminemia -Patient's Albumin Trend: Recent Labs  Lab 02/27/23 0100 02/28/23 0944 03/01/23 0051 03/01/23 1220 03/02/23 0105 03/03/23 0050 03/04/23 0049  ALBUMIN 3.5 3.4* 4.0 3.8 4.4 3.8 3.6  -Continue to Monitor and Trend and repeat CMP in the AM   Morbid Obesity -Complicates overall prognosis and care -Estimated body mass index is 45.48 kg/m as calculated from the  following:   Height as of this encounter: 5\' 2"  (1.575 m).   Weight as of this encounter: 112.8 kg.  -Weight Loss and Dietary Counseling given  Consultants: Cardiology Procedures performed: Cardiogram, right and left heart cath, nuclear medicine cardiac MRI Disposition: Home Diet recommendation:  Discharge Diet Orders (From admission, onward)     Start     Ordered   03/04/23 0000  Diet - low sodium heart healthy        03/04/23 1617           Cardiac diet DISCHARGE MEDICATION: Allergies as of 03/04/2023       Reactions   Aspirin    Patient states that it makes her go into an asthma attack.         Medication List     STOP taking these medications    chlorhexidine 0.12 % solution Commonly known as: Peridex   clindamycin 150 MG capsule Commonly known as: CLEOCIN   ibuprofen 200 MG tablet Commonly known as: ADVIL   omeprazole 20 MG capsule Commonly known as: PRILOSEC   ondansetron 8 MG tablet Commonly known as: Zofran       TAKE these medications    acetaminophen 325 MG tablet Commonly known as: TYLENOL Take 2 tablets (650 mg total) by mouth every 6 (six) hours as needed for mild pain (or Fever >/= 101).   albuterol 108 (90 Base) MCG/ACT inhaler Commonly known as: VENTOLIN HFA Inhale 2 puffs into the lungs every 4 (four) hours as needed (cough and wheeze). For asthma attacks.   budesonide-formoterol 80-4.5 MCG/ACT inhaler Commonly known as: Symbicort Inhale 2 puffs into the lungs 2 (two) times daily. What changed:  when to take this reasons to take this   digoxin 0.125 MG tablet Commonly known as: LANOXIN Take 1 tablet (0.125 mg total) by mouth daily. Start taking on: Mar 05, 2023   fluticasone 50 MCG/ACT nasal spray Commonly known as: FLONASE Place 2 sprays into both nostrils daily. What changed:  when to take this reasons to take this   furosemide 40 MG tablet Commonly known as: LASIX Take 1 tablet (40 mg total) by mouth daily. Start  taking on: Mar 05, 2023   guaiFENesin 600 MG 12 hr tablet Commonly known as: MUCINEX Take 1 tablet (600 mg total) by mouth 2 (two) times daily for 20 days.   ivabradine 5 MG Tabs tablet Commonly known as: CORLANOR Take 0.5 tablets (2.5 mg total) by mouth 2 (two) times daily with a meal.   losartan 25 MG tablet Commonly known as: COZAAR Take 0.5 tablets (12.5 mg total) by mouth daily. Start taking on: Mar 05, 2023   montelukast 10 MG tablet Commonly known as: SINGULAIR Take 1 tablet (10 mg total) by mouth at bedtime.   predniSONE 10 MG tablet Commonly known as: DELTASONE Take 6 tablets by mouth on day 1, 5 tablets on day 2, 4 tablets on day 3, 3 tablets on day 4, 2 tablets on day 5, 1 tablet on day 6 and stop on  day 7   spironolactone 25 MG tablet Commonly known as: ALDACTONE Take 1 tablet (25 mg total) by mouth daily. Start taking on: Mar 05, 2023        Follow-up Information     [facility] Follow up on 03/08/2023.   Why: 11 am for hospital follow up Contact information: [address], [address] [place] [address]        [facility] Follow up on 03/12/2023.   Specialty: Cardiology Why: [facility] 9:30 am Entrance C, Free Valet Parking Please bring all meds to appointment Contact information: [address] [id number] mc [place] [address] [phone]               Discharge Exam: Filed Weights   03/02/23 0440 03/03/23 0443 03/04/23 4742  Weight: 114.1 kg 112.5 kg 112.8 kg   Vitals:   03/04/23 1007 03/04/23 1008  BP:  109/71  Pulse: (!) 108 (!) 110  Resp:  16  Temp:    SpO2:     Examination: Physical Exam:  Constitutional: WN/WD morbidly obese African-American female who is much improved and in no acute distress. Respiratory: Diminished to auscultation bilaterally with coarse breath sounds, no wheezing, rales, rhonchi or  crackles. Normal respiratory effort and patient is not tachypenic. No accessory muscle use.  Unlabored breathing and not wearing supplemental oxygen nasal cannula Cardiovascular: Little bit tachycardic but regular rhythm, no murmurs / rubs / gallops. S1 and S2 auscultated.  1+ extremity edema Abdomen: Soft, non-tender, distended secondary to body habitus. Bowel sounds positive.  GU: Deferred. Musculoskeletal: No clubbing / cyanosis of digits/nails. No joint deformity upper and lower extremities. Skin: No rashes, lesions, ulcers limited skin evaluation. No induration; Warm and dry.  Neurologic: CN 2-12 grossly intact with no focal deficits. Romberg sign and cerebellar reflexes not assessed.  Psychiatric: Normal judgment and insight. Alert and oriented x 3. Normal mood and appropriate affect.   Condition at discharge: stable  The results of significant diagnostics from this hospitalization (including imaging, microbiology, ancillary and laboratory) are listed below for reference.   Imaging Studies: DG CHEST PORT 1 VIEW  Result Date: 03/04/2023 CLINICAL DATA:  Shortness of breath EXAM: PORTABLE CHEST 1 VIEW COMPARISON:  Portable exam 0507 hours compared to 03/03/2023 FINDINGS: Enlargement of cardiac silhouette with pulmonary vascular congestion. Mediastinal contours normal. Mild RIGHT basilar atelectasis. No definite infiltrate, pleural effusion, or pneumothorax. IMPRESSION: Enlargement of cardiac silhouette with pulmonary vascular congestion. Mild RIGHT basilar atelectasis. Electronically Signed   By: Ulyses Southward M.D.   On: 03/04/2023 13:35   DG CHEST PORT 1 VIEW  Result Date: 03/03/2023 CLINICAL DATA:  Short of breath. EXAM: PORTABLE CHEST 1 VIEW COMPARISON:  03/02/2023 and older studies. FINDINGS: Cardiac silhouette mildly enlarged. Vascular prominence and cephalization noted on the previous day's exam is similar. No lung consolidation. No convincing pleural effusion or pneumothorax. IMPRESSION: 1.  No significant change from the previous day's chest radiograph. Mild residual congestive heart failure suspected. Electronically Signed   By: Amie Portland M.D.   On: 03/03/2023 08:17   DG CHEST PORT 1 VIEW  Result Date: 03/02/2023 CLINICAL DATA:  47 year old female with history of shortness of breath. EXAM: PORTABLE CHEST 1 VIEW COMPARISON:  Chest x-ray 03/01/2023. FINDINGS: Lung volumes are normal. No consolidative airspace disease. No pleural effusions. Diffuse peribronchial cuffing and interstitial prominence. No pneumothorax. Cephalization of the pulmonary vasculature. Mild cardiomegaly. Upper mediastinal contours are within normal  limits allowing for patient rotation to the right. IMPRESSION: 1. Similar appearance of the chest suggestive of resolving congestive heart failure, as above. Electronically Signed   By: Trudie Reed M.D.   On: 03/02/2023 10:08   CARDIAC CATHETERIZATION  Result Date: 03/01/2023   The left ventricular ejection fraction is less than 25% by visual estimate. Findings: Ao = 133/93 LV =  134/35 RA = 13 RV = 65/24 PA = 59/42 (48) PCW = 33 Fick cardiac output/index = 6.2/2.9 Thermo CO/CI = 6.3/2.9 PVR = 2.4 WU Ao sat = 95% PA sat = 73%, 73% PAPi = 1.3 Assessment: 1. Severe NICM EF ~20% 2. Normal coronary arteries 3. Marked volume overload 4. Preserved cardiac output Plan/Discussion: She remains tenuous. Will need further diuresis and titration of GDMT. Plan cMRI next week. Arvilla Meres, MD 9:38 AM  DG CHEST PORT 1 VIEW  Result Date: 03/01/2023 CLINICAL DATA:  [id number] SOB (shortness of breath) on exertion [id number] EXAM: PORTABLE CHEST 1 VIEW COMPARISON:  Radiograph 02/28/2023 FINDINGS: Unchanged enlarged cardiac silhouette. There are moderate social opacities, similar to prior exam. No new airspace disease. No large effusion or evidence of pneumothorax. Bones are unchanged. IMPRESSION: Unchanged cardiomegaly with pulmonary vascular congestion/interstitial edema. No new airspace  disease. Electronically Signed   By: Caprice Renshaw M.D.   On: 03/01/2023 08:50   DG CHEST PORT 1 VIEW  Result Date: 02/28/2023 CLINICAL DATA:  Shortness of breath with cough.  History of asthma. EXAM: PORTABLE CHEST 1 VIEW COMPARISON:  Radiographs 02/27/2023, 02/26/2023 and 07/04/2016. FINDINGS: 0624 hours. Stable cardiomegaly and vascular congestion. Edema seen on the examination of 2 days ago has improved. There is no confluent airspace disease, pneumothorax or significant pleural effusion. The bones appear unremarkable. Telemetry leads overlie the chest. IMPRESSION: Cardiomegaly and vascular congestion with improved edema from 2 days ago. No acute cardiopulmonary process. Electronically Signed   By: Carey Bullocks M.D.   On: 02/28/2023 08:34   DG CHEST PORT 1 VIEW  Result Date: 02/27/2023 CLINICAL DATA:  Shortness of breath. EXAM: PORTABLE CHEST 1 VIEW COMPARISON:  February 26, 2023. FINDINGS: Mild cardiomegaly is noted with central pulmonary vascular congestion. No consolidative process is noted. Bony thorax is unremarkable. IMPRESSION: Mild cardiomegaly with central pulmonary vascular congestion. Electronically Signed   By: Lupita Raider M.D.   On: 02/27/2023 10:14   ECHOCARDIOGRAM COMPLETE  Result Date: 02/26/2023    ECHOCARDIOGRAM REPORT   Patient Name:   Carolyn Oconnor Date of Exam: 02/26/2023 Medical Rec #:  [id number]       Height:       62.0 in Accession #:    [id number]      Weight:       237.0 lb Date of Birth:  Dec 24, 1975      BSA:          2.055 m Patient Age:    46 years        BP:           125/85 mmHg Patient Gender: F               HR:           118 bpm. Exam Location:  Inpatient Procedure: 2D Echo, Cardiac Doppler, Color Doppler and Intracardiac            Opacification Agent Indications:    CHF  History:        Patient has no prior history of Echocardiogram examinations.  CHF. Asthma.  Sonographer:    Milbert Coulter Referring Phys: [id number] John Giovanni  Sonographer  Comments: Patient is obese. Image acquisition challenging due to patient body habitus. IMPRESSIONS  1. Left ventricular ejection fraction, by best estimation, is 20 to 25%. The left ventricle has severely decreased function. Left ventricular endocardial border not optimally defined to evaluate regional wall motion despite contrast administration. The left ventricular internal cavity size was mildly to moderately dilated. Left ventricular diastolic parameters are indeterminate.  2. Right ventricular systolic function is hyperdynamic. The right ventricular size is normal. Tricuspid regurgitation signal is inadequate for assessing PA pressure.  3. The mitral valve is normal in structure. Mild to moderate mitral valve regurgitation. No evidence of mitral stenosis.  4. The aortic valve was not well visualized. Aortic valve regurgitation is not visualized. No aortic stenosis is present. Comparison(s): No prior Echocardiogram. Conclusion(s)/Recommendation(s): Reaching out to primary team. FINDINGS  Left Ventricle: Left ventricular ejection fraction, by estimation, is 20 to 25%. The left ventricle has severely decreased function. Left ventricular endocardial border not optimally defined to evaluate regional wall motion. Definity contrast agent was given IV to delineate the left ventricular endocardial borders. The left ventricular internal cavity size was mildly to moderately dilated. Suboptimal image quality limits for assessment of left ventricular hypertrophy. Left ventricular diastolic parameters are indeterminate. Right Ventricle: The right ventricular size is normal. No increase in right ventricular wall thickness. Right ventricular systolic function is hyperdynamic. Tricuspid regurgitation signal is inadequate for assessing PA pressure. Left Atrium: Left atrial size was normal in size. Right Atrium: Right atrial size was normal in size. Pericardium: Trivial pericardial effusion is present. The pericardial effusion is  posterior to the left ventricle. Mitral Valve: The mitral valve is normal in structure. Mild to moderate mitral valve regurgitation. No evidence of mitral valve stenosis. Tricuspid Valve: The tricuspid valve is not well visualized. Tricuspid valve regurgitation is not demonstrated. No evidence of tricuspid stenosis. Aortic Valve: The aortic valve was not well visualized. Aortic valve regurgitation is not visualized. No aortic stenosis is present. Pulmonic Valve: The pulmonic valve was normal in structure. Pulmonic valve regurgitation is not visualized. No evidence of pulmonic stenosis. Aorta: The aortic root is normal in size and structure. IAS/Shunts: No atrial level shunt detected by color flow Doppler.  LEFT VENTRICLE PLAX 2D LVIDd:         5.70 cm LVIDs:         5.50 cm LV PW:         1.10 cm LV IVS:        1.20 cm LVOT diam:     2.10 cm LVOT Area:     3.46 cm  RIGHT VENTRICLE RV Basal diam:  3.60 cm RV Mid diam:    2.80 cm RV S prime:     14.50 cm/s TAPSE (M-mode): 2.1 cm LEFT ATRIUM             Index        RIGHT ATRIUM           Index LA diam:        4.60 cm 2.24 cm/m   RA Area:     19.30 cm LA Vol (A2C):   56.7 ml 27.60 ml/m  RA Volume:   60.70 ml  29.54 ml/m LA Vol (A4C):   56.1 ml 27.30 ml/m LA Biplane Vol: 63.5 ml 30.90 ml/m   SHUNTS Systemic Diam: 2.10 cm Riley Lam MD Electronically signed by Riley Lam MD Signature Date/Time:  02/26/2023/3:25:57 PM    Final    DG Chest Port 1 View  Result Date: 02/26/2023 CLINICAL DATA:  Shortness of breath EXAM: PORTABLE CHEST 1 VIEW COMPARISON:  07/04/2016 FINDINGS: Cardiac shadow is enlarged. Central vascular congestion is noted without significant edema. No focal infiltrate is noted. No bony abnormality is seen. IMPRESSION: Changes consistent with CHF. Electronically Signed   By: Alcide Clever M.D.   On: 02/26/2023 01:21    Microbiology: Results for orders placed or performed during the hospital encounter of 02/26/23  Resp panel by  RT-PCR (RSV, Flu A&B, Covid) Anterior Nasal Swab     Status: None   Collection Time: 02/26/23  1:15 AM   Specimen: Anterior Nasal Swab  Result Value Ref Range Status   SARS Coronavirus 2 by RT PCR NEGATIVE NEGATIVE Final   Influenza A by PCR NEGATIVE NEGATIVE Final   Influenza B by PCR NEGATIVE NEGATIVE Final    Comment: (NOTE) The Xpert Xpress SARS-CoV-2/FLU/RSV plus assay is intended as an aid in the diagnosis of influenza from Nasopharyngeal swab specimens and should not be used as a sole basis for treatment. Nasal washings and aspirates are unacceptable for Xpert Xpress SARS-CoV-2/FLU/RSV testing.  Fact Sheet for Patients: BloggerCourse.com  Fact Sheet for Healthcare Providers: SeriousBroker.it  This test is not yet approved or cleared by the [place] FDA and has been authorized for detection and/or diagnosis of SARS-CoV-2 by FDA under an Emergency Use Authorization (EUA). This EUA will remain in effect (meaning this test can be used) for the duration of the COVID-19 declaration under Section 564(b)(1) of the Act, 21 U.S.C. section 360bbb-3(b)(1), unless the authorization is terminated or revoked.     Resp Syncytial Virus by PCR NEGATIVE NEGATIVE Final    Comment: (NOTE) Fact Sheet for Patients: BloggerCourse.com  Fact Sheet for Healthcare Providers: SeriousBroker.it  This test is not yet approved or cleared by the [place] FDA and has been authorized for detection and/or diagnosis of SARS-CoV-2 by FDA under an Emergency Use Authorization (EUA). This EUA will remain in effect (meaning this test can be used) for the duration of the COVID-19 declaration under Section 564(b)(1) of the Act, 21 U.S.C. section 360bbb-3(b)(1), unless the authorization is terminated or revoked.  Performed at [facility], [address] [place], [place] [address]   Respiratory  (~20 pathogens) panel by PCR     Status: None   Collection Time: 02/27/23  1:51 PM   Specimen: Nasopharyngeal Swab; Respiratory  Result Value Ref Range Status   Adenovirus NOT DETECTED NOT DETECTED Final   Coronavirus 229E NOT DETECTED NOT DETECTED Final    Comment: (NOTE) The Coronavirus on the Respiratory Panel, DOES NOT test for the novel  Coronavirus (2019 nCoV)    Coronavirus HKU1 NOT DETECTED NOT DETECTED Final   Coronavirus NL63 NOT DETECTED NOT DETECTED Final   Coronavirus OC43 NOT DETECTED NOT DETECTED Final   Metapneumovirus NOT DETECTED NOT DETECTED Final   Rhinovirus / Enterovirus NOT DETECTED NOT DETECTED Final   Influenza A NOT DETECTED NOT DETECTED Final   Influenza B NOT DETECTED NOT DETECTED Final   Parainfluenza Virus 1 NOT DETECTED NOT DETECTED Final   Parainfluenza Virus 2 NOT DETECTED NOT DETECTED Final   Parainfluenza Virus 3 NOT DETECTED NOT DETECTED Final   Parainfluenza Virus 4 NOT DETECTED NOT DETECTED Final   Respiratory Syncytial Virus NOT DETECTED NOT DETECTED Final   Bordetella pertussis NOT DETECTED NOT DETECTED Final   Bordetella Parapertussis NOT DETECTED NOT  DETECTED Final   Chlamydophila pneumoniae NOT DETECTED NOT DETECTED Final   Mycoplasma pneumoniae NOT DETECTED NOT DETECTED Final    Comment: Performed at [facility], [address] [place], [place] [address]   Labs: CBC: Recent Labs  Lab 02/28/23 0106 03/01/23 0051 03/01/23 0840 03/01/23 0844 03/02/23 0105 03/03/23 0050 03/04/23 0049  WBC 11.4* 13.9*  --   --  14.3* 12.1* 11.0*  NEUTROABS 8.8* 10.1*  --   --  10.9* 9.2* 8.3*  HGB 13.0 15.5* 15.0 15.6*  16.0* 16.4* 15.4* 16.0*  HCT 40.2 46.7* 44.0 46.0  47.0* 47.8* 46.0 48.7*  MCV 85.2 83.5  --   --  81.4 82.1 83.1  PLT 298 357  --   --  433* 393 377   Basic Metabolic Panel: Recent Labs  Lab 03/01/23 0051 03/01/23 0840 03/01/23 1220 03/02/23 0105 03/02/23 0600 03/02/23 1048 03/03/23 0050 03/04/23 0049  NA 136    < > 137 132*  --  135 132* 131*  K 4.7   < > 3.8 5.2* 4.3 4.1 4.0 4.0  CL 103  --  102 93*  --  93* 95* 93*  CO2 22  --  23 22  --  23 25 25   GLUCOSE 97  --  108* 104*  --  96 122* 108*  BUN 23*  --  19 34*  --  38* 41* 46*  CREATININE 1.08*  --  1.04* 1.29*  --  1.45* 1.23* 1.22*  CALCIUM 9.6  --  9.4 10.4*  --  9.9 9.3 9.2  MG 2.5*  --   --  2.6* 2.5*  --  2.7* 2.7*  PHOS 4.9*  --  4.4 7.4*  --   --  6.7* 5.0*   < > = values in this interval not displayed.   Liver Function Tests: Recent Labs  Lab 02/28/23 0944 03/01/23 0051 03/01/23 1220 03/02/23 0105 03/03/23 0050 03/04/23 0049  AST 19 24  --  32 29 25  ALT 19 24  --  35 31 30  ALKPHOS 37* 43  --  50 44 42  BILITOT 0.8 0.8  --  0.6 0.9 0.9  PROT 6.4* 7.6  --  8.0 7.4 7.3  ALBUMIN 3.4* 4.0 3.8 4.4 3.8 3.6   CBG: No results for input(s): "GLUCAP" in the last 168 hours.  Discharge time spent: greater than 30 minutes.  Signed: Marguerita Merles, DO [facility] 03/04/2023

## 2023-03-04 NOTE — Progress Notes (Signed)
RN went over discharge instructions and medication changes with patient at the beside. Patient verbalize understanding. TOC meds at bedside. Scale at bedside. Work note provided to the patient. PIV removed by NT and tele monitor removed. CCMD notified. Transportation is at the bedside.

## 2023-03-04 NOTE — Telephone Encounter (Signed)
Advanced Heart Failure Patient Advocate Encounter  Application for Corlanor faxed to [employer] on 03/04/2023. Application form attached to patient chart.  Burnell Blanks, CPhT Rx Patient Advocate Phone: [phone]

## 2023-03-04 NOTE — Progress Notes (Signed)
SATURATION QUALIFICATIONS: (This note is used to comply with regulatory documentation for home oxygen)  Patient Saturations on Room Air at Rest = 94%  Patient Saturations on Room Air while Ambulating = 94%  Patient Saturations on 0 Liters of oxygen while Ambulating = 94%  Please briefly explain why patient needs home oxygen:  Patient does not meet the requirements for home oxygen.

## 2023-03-05 DIAGNOSIS — N179 Acute kidney failure, unspecified: Secondary | ICD-10-CM

## 2023-03-05 NOTE — Progress Notes (Signed)
Erroneous encounter-disregard

## 2023-03-06 LAB — LIPOPROTEIN A (LPA): Lipoprotein (a): 186.3 nmol/L — ABNORMAL HIGH (ref <75.0)

## 2023-03-08 ENCOUNTER — Encounter: Payer: Self-pay | Admitting: Family

## 2023-03-08 DIAGNOSIS — I509 Heart failure, unspecified: Secondary | ICD-10-CM

## 2023-03-08 DIAGNOSIS — Z7689 Persons encountering health services in other specified circumstances: Secondary | ICD-10-CM

## 2023-03-08 DIAGNOSIS — J45901 Unspecified asthma with (acute) exacerbation: Secondary | ICD-10-CM

## 2023-03-08 DIAGNOSIS — Z09 Encounter for follow-up examination after completed treatment for conditions other than malignant neoplasm: Secondary | ICD-10-CM

## 2023-03-08 NOTE — Telephone Encounter (Signed)
Called and left a VM for the pt

## 2023-03-11 NOTE — Telephone Encounter (Signed)
Advanced Heart Failure Patient Advocate Encounter  Contacted [employer] for status update. Confirmed information from application, representative stated that a determination should be received within 2 business days.

## 2023-03-12 ENCOUNTER — Ambulatory Visit (HOSPITAL_COMMUNITY)
Admission: RE | Admit: 2023-03-12 | Discharge: 2023-03-12 | Disposition: A | Discharge status: Home / Self Care | Payer: Self-pay | Source: Ambulatory Visit | Attending: Internal Medicine | Admitting: Internal Medicine

## 2023-03-12 ENCOUNTER — Other Ambulatory Visit (HOSPITAL_COMMUNITY): Payer: Self-pay

## 2023-03-12 ENCOUNTER — Encounter (HOSPITAL_COMMUNITY): Payer: Self-pay

## 2023-03-12 VITALS — BP 124/72 | HR 84 | Wt 258.4 lb

## 2023-03-12 DIAGNOSIS — Z7951 Long term (current) use of inhaled steroids: Secondary | ICD-10-CM | POA: Insufficient documentation

## 2023-03-12 DIAGNOSIS — Z6841 Body Mass Index (BMI) 40.0 and over, adult: Secondary | ICD-10-CM | POA: Insufficient documentation

## 2023-03-12 DIAGNOSIS — I5022 Chronic systolic (congestive) heart failure: Secondary | ICD-10-CM

## 2023-03-12 DIAGNOSIS — I428 Other cardiomyopathies: Secondary | ICD-10-CM | POA: Insufficient documentation

## 2023-03-12 DIAGNOSIS — Z5986 Financial insecurity: Secondary | ICD-10-CM | POA: Insufficient documentation

## 2023-03-12 DIAGNOSIS — I4729 Other ventricular tachycardia: Secondary | ICD-10-CM

## 2023-03-12 DIAGNOSIS — R0789 Other chest pain: Secondary | ICD-10-CM | POA: Insufficient documentation

## 2023-03-12 DIAGNOSIS — Z79899 Other long term (current) drug therapy: Secondary | ICD-10-CM | POA: Insufficient documentation

## 2023-03-12 DIAGNOSIS — J4489 Other specified chronic obstructive pulmonary disease: Secondary | ICD-10-CM | POA: Insufficient documentation

## 2023-03-12 LAB — BASIC METABOLIC PANEL
Anion gap: 13 (ref 5–15)
BUN: 21 mg/dL — ABNORMAL HIGH (ref 6–20)
CO2: 22 mmol/L (ref 22–32)
Calcium: 9.4 mg/dL (ref 8.9–10.3)
Chloride: 104 mmol/L (ref 98–111)
Creatinine, Ser: 0.83 mg/dL (ref 0.44–1.00)
GFR, Estimated: >60 mL/min (ref >60)
Glucose, Bld: 81 mg/dL (ref 70–99)
Potassium: 3.5 mmol/L (ref 3.5–5.1)
Sodium: 139 mmol/L (ref 135–145)

## 2023-03-12 MED ORDER — FUROSEMIDE 40 MG PO TABS
40.0000 mg | ORAL_TABLET | Freq: Every day | ORAL | 3 refills | Status: DC
  Filled 2023-03-12 – 2023-04-01 (×2): qty 30, 30d supply, fill #0
  Filled 2023-06-04 (×2): qty 30, 30d supply, fill #1
  Filled 2023-10-22: qty 30, 30d supply, fill #2

## 2023-03-12 MED ORDER — DIGOXIN 125 MCG PO TABS
0.1250 mg | ORAL_TABLET | Freq: Every day | ORAL | 3 refills | Status: DC
  Filled 2023-03-12 – 2023-04-19 (×2): qty 30, 30d supply, fill #0
  Filled 2023-06-04 (×2): qty 30, 30d supply, fill #1

## 2023-03-12 MED ORDER — LOSARTAN POTASSIUM 25 MG PO TABS
12.5000 mg | ORAL_TABLET | Freq: Every day | ORAL | 3 refills | Status: DC
  Filled 2023-03-12 – 2023-03-27 (×2): qty 30, 60d supply, fill #0
  Filled 2023-06-04: qty 30, 60d supply, fill #1
  Filled 2023-06-04: qty 15, 30d supply, fill #1
  Filled 2023-10-22: qty 15, 30d supply, fill #2

## 2023-03-12 MED ORDER — SPIRONOLACTONE 25 MG PO TABS
25.0000 mg | ORAL_TABLET | Freq: Every day | ORAL | 3 refills | Status: DC
  Filled 2023-03-12 – 2023-04-01 (×2): qty 30, 30d supply, fill #0
  Filled 2023-06-04 (×2): qty 30, 30d supply, fill #1

## 2023-03-12 MED ORDER — BISOPROLOL FUMARATE 5 MG PO TABS
2.5000 mg | ORAL_TABLET | Freq: Two times a day (BID) | ORAL | 6 refills | Status: DC
  Filled 2023-03-12: qty 30, 30d supply, fill #0
  Filled 2023-04-19: qty 30, 30d supply, fill #1
  Filled 2023-06-04 (×2): qty 30, 30d supply, fill #2

## 2023-03-12 MED ORDER — IVABRADINE HCL 5 MG PO TABS
2.5000 mg | ORAL_TABLET | Freq: Two times a day (BID) | ORAL | 3 refills | Status: DC
  Filled 2023-03-12 – 2023-03-14 (×2): qty 60, 60d supply, fill #0

## 2023-03-12 NOTE — Progress Notes (Addendum)
Heart and Vascular Care Navigation  03/12/2023  Carolyn Oconnor [birth year] [id number]  Reason for Referral: Patient seen in APP clinic today to discuss disability options. Patient is participating in a Managed Medicaid Plan: No   Engaged with patient face to face for initial visit for Heart and Vascular Care Coordination.                                                                                                   Assessment: Patient is a 47 yo female who lives with her significant other in an apartment. She reports recent hospitalization for heart failure and returned to work yesterday. 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 Unfortunately, she has not had insurance but states she will start receiving health insurance through [employer] beginning March 30, 2023. Patient shares concern about past medical bill as well as needing medications.      She is also interested in disability. CSW discussed short term and long term disability possibly offered through her employer and typically how that works. CSW explained that given she has returned to work she may not qualify for disability but suggested that she speak with her HR staff for further clarification.  CSW also discussed Social Security Disability and the process to apply.                          HRT/VAS Care Coordination     Patients Home Cardiology Office Heart Failure Clinic   Outpatient Care Team Social Worker   Social Worker Name: Lasandra Beech, [place] [phone]   Living arrangements for the past 2 months Apartment   Lives with: Self; Significant Other   Patient Current Insurance Coverage Self-Pay   Patient Has Concern With Paying Medical Bills Yes   Does Patient Have Prescription Coverage? No   Patient Prescription Assistance Programs Heart Failure Fund   Home Assistive Devices/Equipment Nebulizer       Social History:                                                                             SDOH Screenings    Food Insecurity: No Food Insecurity (02/26/2023)  Housing: Low Risk  (02/27/2023)  Transportation Needs: No Transportation Needs (02/27/2023)  Utilities: Not At Risk (02/26/2023)  Alcohol Screen: Low Risk  (02/27/2023)  Financial Resource Strain: High Risk (03/12/2023)  Tobacco Use: Medium Risk (03/12/2023)    SDOH Interventions: Financial Resources:  Surveyor, quantity Strain Interventions: Artist (CAFA application provided) Editor, commissioning for [employer] Program  Food Insecurity:     Housing Insecurity:     Transportation:       Follow-up plan: Patient appears very motivated and positive about her disease management. She stated at the end of the conversation "I need  to wake up and take control of my life".  CSW provided patient with information on disability and medicaid to take home as well the CAFA application. Patient plans to complete CAFA and return to CSW for further assistance as needed.  CSW will be available if needed. Lasandra Beech, LCSW, CCSW-MCS [phone]

## 2023-03-12 NOTE — Progress Notes (Addendum)
ADVANCED HF CLINIC CONSULT NOTE  Primary Care: System, Provider Not In AHF Cardiologist: Dr. Gala Romney, MD  HPI: Carolyn Oconnor is a 47 y.o.  AAF w/ h/o asthma, obesity and recently diagnosed systolic heart failure.   Admitted 4/30- 03/04/23 with acute systolic HF. Presented to the ED w/ several weeks progressive SOB, orthopnea and b/l LE. Also felt to have component of acute asthma exacerbation. Diuresed with IV Lasix, started prednisone and bronchodilators. 2D echo was completed and showed severely reduced LVEF 20-25%, mild- mod MR, RV hyperdynamic. L/RHC: normal coronaries, marked volume overload with persevered CO. cMRI 5/24: LVEF 25%, RVEF 42%. Non ischemic cardiomyopathy. GDMT started. Discharge weight 248lbs.   Today she returns for post hospital follow up. Overall feeling fine. Denies palpitations, dizziness, edema, or PND/Orthopnea. 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 No SOB. Appetite ok. No fever or chills. Weight at home 255 pounds. Taking all medications. SBP 120s at home. Denies ETOH, smoking.    Cardiac studies reviewed: - [facility] 5/24: Normal cors RA 13 PA 59/42 (48) PCWP 33 CO/CI (Fick) 6.2/2.9 PVR 2.4 WU PAPi 1.3  - cMRI 5/24: LVEF 25%, RVEF 42%. Non ischemic cardiomyopathy  - Echo 4/24: LVEF 20-25%, mild- mod MR, RV hyperdynamic.  Review of Systems: [y] = yes, [ ]  = no   General: Weight gain [ ] ; Weight loss [ ] ; Anorexia [ ] ; Fatigue [ ] ; Fever [ ] ; Chills [ ] ; Weakness [ ]   Cardiac: Chest pain/pressure [ Y]; Resting SOB [ ] ; Exertional SOB [ ] ; Orthopnea [ ] ; Pedal Edema [ ] ; Palpitations [ ] ; Syncope [ ] ; Presyncope [ ] ; Paroxysmal nocturnal dyspnea[ ]   Pulmonary: Cough [Y ]; Wheezing[ ] ; Hemoptysis[ ] ; Sputum [ ] ; Snoring [ ]   GI: Vomiting[ ] ; Dysphagia[ ] ; Melena[ ] ; Hematochezia [ ] ; Heartburn[ ] ; Abdominal pain [ ] ; Constipation [ ] ; Diarrhea [ ] ; BRBPR [ ]   GU: Hematuria[ ] ; Dysuria [ ] ;  Nocturia[ ]   Vascular: Pain in legs with walking [ ] ; Pain in feet with lying flat [ ] ; Non-healing sores [ ] ; Stroke [ ] ; TIA [ ] ; Slurred speech [ ] ;  Neuro: Headaches[ ] ; Vertigo[ ] ; Seizures[ ] ; Paresthesias[ ] ;Blurred vision [ ] ; Diplopia [ ] ; Vision changes [ ]   Ortho/Skin: Arthritis [ ] ; Joint pain [ ] ; Muscle pain [ ] ; Joint swelling [ ] ; Back Pain [ ] ; Rash [ ]   Psych: Depression[ ] ; Anxiety[ ]   Heme: Bleeding problems [ ] ; Clotting disorders [ ] ; Anemia [ ]   Endocrine: Diabetes [ ] ; Thyroid dysfunction[ ]    Past Medical History:  Diagnosis Date   Allergic rhinitis    Asthma    Bronchitis, chronic (HCC)    Pneumonia     Current Outpatient Medications  Medication Sig Dispense Refill   acetaminophen (TYLENOL) 325 MG tablet Take 2 tablets (650 mg total) by mouth every 6 (six) hours as needed for mild pain (or Fever >/= 101). 20 tablet 0   albuterol (PROVENTIL HFA;VENTOLIN HFA) 108 (90 Base) MCG/ACT inhaler Inhale 2 puffs into the lungs every 4 (four) hours as needed (cough and wheeze). For asthma attacks. 1 Inhaler 3   bisoprolol (ZEBETA) 5 MG tablet Take 1/2 tablet (2.5 mg total) by mouth 2 (two) times daily. 30 tablet 6   budesonide-formoterol (SYMBICORT) 80-4.5 MCG/ACT inhaler Inhale 2 puffs into the lungs 2 (two) times daily. (Patient taking differently: Inhale 2 puffs into the lungs 2 (  two) times daily as needed (shortness of breath).) 1 Inhaler 3   fluticasone (FLONASE) 50 MCG/ACT nasal spray Place 2 sprays into both nostrils daily. (Patient taking differently: Place 2 sprays into both nostrils daily as needed for allergies.) 16 g 2   guaiFENesin (MUCINEX) 600 MG 12 hr tablet Take 1 tablet (600 mg total) by mouth 2 (two) times daily for 20 days. 40 tablet 0   montelukast (SINGULAIR) 10 MG tablet Take 1 tablet (10 mg total) by mouth at bedtime. 30 tablet 3   predniSONE (DELTASONE) 10 MG tablet Take 6 tablets by mouth on day 1, 5 tablets on day 2, 4 tablets on day 3, 3 tablets on  day 4, 2 tablets on day 5, 1 tablet on day 6 and stop on day 7 21 tablet 0   digoxin (LANOXIN) 0.125 MG tablet Take 1 tablet (0.125 mg total) by mouth daily. 30 tablet 3   furosemide (LASIX) 40 MG tablet Take 1 tablet (40 mg total) by mouth daily. 30 tablet 3   ivabradine (CORLANOR) 5 MG TABS tablet Take 0.5 tablets (2.5 mg total) by mouth 2 (two) times daily with a meal. 60 tablet 3   losartan (COZAAR) 25 MG tablet Take 1/2 tablet (12.5 mg total) by mouth daily. 30 tablet 3   spironolactone (ALDACTONE) 25 MG tablet Take 1 tablet (25 mg total) by mouth daily. 30 tablet 3   No current facility-administered medications for this encounter.    Allergies  Allergen Reactions   Aspirin     Patient states that it makes her go into an asthma attack.       Social History   Socioeconomic History   Marital status: Single    Spouse name: Not on file   Number of children: 0   Years of education: Not on file   Highest education level: High school graduate  Occupational History   Occupation: [employer]  Tobacco Use   Smoking status: Never    Passive exposure: Yes   Smokeless tobacco: Never   Tobacco comments:    Secondhand from Father  Vaping Use   Vaping Use: Never used  Substance and Sexual Activity   Alcohol use: No    Alcohol/week: 0.0 standard drinks of alcohol   Drug use: No    Frequency: 1.0 times per week   Sexual activity: Not on file  Other Topics Concern   Not on file  Social History Narrative   Single, lives with boyfriend   CNA, Editor, commissioning   No recent travel      Adult nurse Pulmonary:   Originally from [place]. Always lived in [place]. Previously hasn't traveled outside of the state. 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 No pets currently. She does have carpet & draperies in her home as well as the bedroom. No indoor plants. No bird exposure. No mold exposure. Enjoys drawing & reading.    Social Determinants of Health    Financial Resource Strain: High Risk (03/12/2023)   Overall Financial Resource Strain (CARDIA)    Difficulty of Paying Living Expenses: Very hard  Food Insecurity: No Food Insecurity (02/26/2023)   Hunger Vital Sign    Worried About Running Out of Food in the Last Year: Never true    Ran Out of Food in the Last Year: Never true  Transportation Needs: No Transportation Needs (02/27/2023)   PRAPARE - Administrator, Civil Service (Medical): No  Lack of Transportation (Non-Medical): No  Physical Activity: Not on file  Stress: Not on file  Social Connections: Not on file  Intimate Partner Violence: Not At Risk (02/26/2023)   Humiliation, Afraid, Rape, and Kick questionnaire    Fear of Current or Ex-Partner: No    Emotionally Abused: No    Physically Abused: No    Sexually Abused: No     Family History  Problem Relation Age of Onset   Allergies Mother    Heart disease Mother    Allergies Father    Asthma Father    Cancer Sister    Asthma Sister    Allergies Sister    Heart disease Maternal Aunt    Breast cancer Maternal Aunt    Asthma Brother    Allergies Brother     Vitals:   03/12/23 0923  BP: 124/72  Pulse: 84  SpO2: 98%  Weight: 117.2 kg (258 lb 6.4 oz)    Wt Readings from Last 3 Encounters:  03/12/23 117.2 kg (258 lb 6.4 oz)  03/04/23 112.8 kg (248 lb 10.9 oz)  07/04/16 107.5 kg (237 lb)   PHYSICAL EXAM: General:  well appearing.  No respiratory difficulty HEENT: normal Neck: supple. JVD flat. Carotids 2+ bilat; no bruits. No lymphadenopathy or thyromegaly appreciated. Cor: PMI nondisplaced. Regular rate & rhythm. No rubs, gallops or murmurs. Lungs: clear Abdomen: obese, soft, nontender, nondistended. No hepatosplenomegaly. No bruits or masses. Good bowel sounds. Extremities: no cyanosis, clubbing, rash, edema  Neuro: alert & oriented x 3, cranial nerves grossly intact. moves all 4 extremities w/o difficulty. Affect pleasant.   ECG: NSR  occasional PVCs 91 bpm (Personally reviewed)    ASSESSMENT & PLAN: 1. Chronic Systolic Heart Failure, Nonischemic CM - new diagnosis 4/24 - Echo EF 20-25% (looks closer to 30-35%), RV hyperdynamic  - R/LHC 5/3: no CAD, RA 13, PA 59/42 (48), PCW 33, LV EDP 35. Fick CO 6.13, CI 2.87. Thermo CO/CI: 6.3/2.9. PAPi 1.3 - cMRI 5/24: LVEF 25%, RVEF 42%. Non ischemic cardiomyopathy.  - Etiology uncertain. BPs normotensive.  - NYHA class II-IIIa, volume stable - Continue 40 lasix daily PO.  - continue spiro 25 mg daily  - continue losartan 12.5 mg daily   - continue digoxin 0.125 mg daily, check level at next visit - Continue corlanor 2.5 mg BID - Start bisoprolol 2.5 mg BID - BMET today   2. Morbid obesity - Body mass index is 45.38 kg/m. - consider GLP1RA, will refer to PharmD - Consider sleep study, wants to think about it for now   3. H/o NSVT - NSR today  Needs medication assistance (insurance doesn't kick in until June) and asked about disability. Social work to see today.   Follow up 3-4 months with Dr. Gala Romney + echo  Brynda Peon, AGACNP-BC  03/12/23  Advanced Heart Failure Clinic [facility] [address] [facility] [place] [address] [phone] (office) [phone] (fax)

## 2023-03-12 NOTE — Telephone Encounter (Signed)
Advanced Heart Failure Patient Advocate Encounter  Patient was approved to receive Corlanor from [employer] Effective 03/12/2023 to 03/11/2024  Determination letter has been added to patient chart. Left voicemail letting patient know that the program has been approved, as well as the phone number to reach [employer] to schedule delivery, and my direct number if patient has any further issues.  Burnell Blanks, CPhT Rx Patient Advocate Phone: [phone]

## 2023-03-12 NOTE — Addendum Note (Signed)
Encounter addended by: Alen Bleacher, NP on: 03/12/2023 4:06 PM  Actions taken: Clinical Note Signed

## 2023-03-12 NOTE — Patient Instructions (Signed)
START Bisoprolol 2.5mg  one half tab twice a day  Labs today We will only contact you if something comes back abnormal or we need to make some changes. Otherwise no news is good news!  You have been [facility] to [facility]  -they will be in contact with an appointment   Your physician recommends that you schedule a follow-up appointment in: 3-4 months with Dr Gala Romney and echo   Your physician has requested that you have an echocardiogram. Echocardiography is a painless test that uses sound waves to create images of your heart. It provides your doctor with information about the size and shape of your heart and how well your heart's chambers and valves are working. This procedure takes approximately one hour. There are no restrictions for this procedure. Please do NOT wear cologne, perfume, aftershave, or lotions (deodorant is allowed). Please arrive 15 minutes prior to your appointment time.   Do the following things EVERYDAY: Weigh yourself in the morning before breakfast. Write it down and keep it in a log. Take your medicines as prescribed Eat low salt foods--Limit salt (sodium) to 2000 mg per day.  Stay as active as you can everyday Limit all fluids for the day to less than 2 liters  At the Advanced Heart Failure Clinic, you and your health needs are our priority. As part of our continuing mission to provide you with exceptional heart care, we have created designated Provider Care Teams. These Care Teams include your primary Cardiologist (physician) and Advanced Practice Providers (APPs- Physician Assistants and Nurse Practitioners) who all work together to provide you with the care you need, when you need it.   You may see any of the following providers on your designated Care Team at your next follow up: Dr Arvilla Meres Dr Marca Ancona Dr. Marcos Eke, NP Robbie Lis, [place] [facility] [place], [place] Brynda Peon, NP Karle Plumber,  PharmD   Please be sure to bring in all your medications bottles to every appointment.    Thank you for choosing  [facility]   If you have any questions or concerns before your next appointment please send [place] a message through [employer] or call our office at [phone].    TO LEAVE A MESSAGE FOR THE NURSE SELECT OPTION 2, PLEASE LEAVE A MESSAGE INCLUDING: YOUR NAME DATE OF BIRTH CALL BACK NUMBER REASON FOR CALL**this is important as we prioritize the call backs  YOU WILL RECEIVE A CALL BACK THE SAME DAY AS LONG AS YOU CALL BEFORE 4:00 PM

## 2023-03-14 ENCOUNTER — Other Ambulatory Visit (HOSPITAL_COMMUNITY): Payer: Self-pay

## 2023-03-14 ENCOUNTER — Telehealth (HOSPITAL_COMMUNITY): Payer: Self-pay | Admitting: Cardiology

## 2023-03-14 NOTE — Telephone Encounter (Signed)
Referral for GLP1 therapy submitted and reviewed by pharmacy team At this time unable to proceed with referral as pt does not have RX coverage. Pts new insurance effective start date 03/30/23 Will re-refer at that time

## 2023-03-18 ENCOUNTER — Encounter (HOSPITAL_COMMUNITY): Payer: Self-pay

## 2023-03-18 ENCOUNTER — Other Ambulatory Visit (HOSPITAL_COMMUNITY): Payer: Self-pay

## 2023-03-18 MED ORDER — IVABRADINE HCL 5 MG PO TABS: 2.5000 mg | ORAL_TABLET | Freq: Two times a day (BID) | ORAL | 3 refills | Status: DC

## 2023-03-27 ENCOUNTER — Other Ambulatory Visit (HOSPITAL_COMMUNITY): Payer: Self-pay

## 2023-03-27 ENCOUNTER — Encounter: Payer: Self-pay | Admitting: Adult Health

## 2023-04-01 ENCOUNTER — Other Ambulatory Visit (HOSPITAL_COMMUNITY): Payer: Self-pay

## 2023-04-03 ENCOUNTER — Telehealth (HOSPITAL_COMMUNITY): Payer: Self-pay | Admitting: Licensed Clinical Social Worker

## 2023-04-03 NOTE — Telephone Encounter (Signed)
Patient sent in documents for CAFA application to CSW in the mail. CSW will submit to Financial Counseling office for review. Lasandra Beech, LCSW, CCSW-MCS [phone]

## 2023-04-03 NOTE — Progress Notes (Signed)
This encounter was created in error - please disregard.

## 2023-04-12 ENCOUNTER — Other Ambulatory Visit (HOSPITAL_COMMUNITY): Payer: Self-pay

## 2023-04-12 ENCOUNTER — Ambulatory Visit (INDEPENDENT_AMBULATORY_CARE_PROVIDER_SITE_OTHER): Payer: No Typology Code available for payment source | Admitting: Adult Health

## 2023-04-12 ENCOUNTER — Encounter: Payer: Self-pay | Admitting: Adult Health

## 2023-04-12 VITALS — BP 132/78 | HR 80 | Temp 97.7°F | Resp 20 | Ht 61.0 in | Wt 255.8 lb

## 2023-04-12 DIAGNOSIS — Z7689 Persons encountering health services in other specified circumstances: Secondary | ICD-10-CM | POA: Diagnosis not present

## 2023-04-12 DIAGNOSIS — I10 Essential (primary) hypertension: Secondary | ICD-10-CM | POA: Diagnosis not present

## 2023-04-12 DIAGNOSIS — I5022 Chronic systolic (congestive) heart failure: Secondary | ICD-10-CM | POA: Diagnosis not present

## 2023-04-12 DIAGNOSIS — J4550 Severe persistent asthma, uncomplicated: Secondary | ICD-10-CM | POA: Diagnosis not present

## 2023-04-12 DIAGNOSIS — Z113 Encounter for screening for infections with a predominantly sexual mode of transmission: Secondary | ICD-10-CM

## 2023-04-12 DIAGNOSIS — J209 Acute bronchitis, unspecified: Secondary | ICD-10-CM

## 2023-04-12 LAB — CBC WITH DIFFERENTIAL/PLATELET
Eosinophils Absolute: 311 cells/uL (ref 15–500)
Lymphs Abs: 3367 cells/uL (ref 850–3900)
Monocytes Relative: 12.1 %
Neutro Abs: 2790 cells/uL (ref 1500–7800)
RDW: 14.2 % (ref 11.0–15.0)
Total Lymphocyte: 45.5 %
WBC: 7.4 10*3/uL (ref 3.8–10.8)

## 2023-04-12 MED ORDER — DOXYCYCLINE HYCLATE 100 MG PO TABS
100.0000 mg | ORAL_TABLET | Freq: Two times a day (BID) | ORAL | 0 refills | Status: AC
Start: 2023-04-12 — End: 2023-04-26
  Filled 2023-04-12: qty 14, 7d supply, fill #0

## 2023-04-12 NOTE — Patient Instructions (Signed)
Preventive Care 46-46 Years Old, Female Preventive care refers to lifestyle choices and visits with your health care provider that can promote health and wellness. Preventive care visits are also called wellness exams. What can I expect for my preventive care visit? Counseling Your health care provider may ask you questions about your: Medical history, including: Past medical problems. Family medical history. Pregnancy history. Current health, including: Menstrual cycle. Method of birth control. Emotional well-being. Home life and relationship well-being. Sexual activity and sexual health. Lifestyle, including: Alcohol, nicotine or tobacco, and drug use. Access to firearms. Diet, exercise, and sleep habits. Work and work environment. Sunscreen use. Safety issues such as seatbelt and bike helmet use. Physical exam Your health care provider will check your: Height and weight. These may be used to calculate your BMI (body mass index). BMI is a measurement that tells if you are at a healthy weight. Waist circumference. This measures the distance around your waistline. This measurement also tells if you are at a healthy weight and may help predict your risk of certain diseases, such as type 2 diabetes and high blood pressure. Heart rate and blood pressure. Body temperature. Skin for abnormal spots. What immunizations do I need?  Vaccines are usually given at various ages, according to a schedule. Your health care provider will recommend vaccines for you based on your age, medical history, and lifestyle or other factors, such as travel or where you work. What tests do I need? Screening Your health care provider may recommend screening tests for certain conditions. This may include: Lipid and cholesterol levels. Diabetes screening. This is done by checking your blood sugar (glucose) after you have not eaten for a while (fasting). Pelvic exam and Pap test. Hepatitis B test. Hepatitis C  test. HIV (human immunodeficiency virus) test. STI (sexually transmitted infection) testing, if you are at risk. Lung cancer screening. Colorectal cancer screening. Mammogram. Talk with your health care provider about when you should start having regular mammograms. This may depend on whether you have a family history of breast cancer. BRCA-related cancer screening. This may be done if you have a family history of breast, ovarian, tubal, or peritoneal cancers. Bone density scan. This is done to screen for osteoporosis. Talk with your health care provider about your test results, treatment options, and if necessary, the need for more tests. Follow these instructions at home: Eating and drinking  Eat a diet that includes fresh fruits and vegetables, whole grains, lean protein, and low-fat dairy products. Take vitamin and mineral supplements as recommended by your health care provider. Do not drink alcohol if: Your health care provider tells you not to drink. You are pregnant, may be pregnant, or are planning to become pregnant. If you drink alcohol: Limit how much you have to 0-1 drink a day. Know how much alcohol is in your drink. In the [place] one drink equals one 12 oz bottle of beer (355 mL), one 5 oz glass of wine (148 mL), or one 1 oz glass of hard liquor (44 mL). Lifestyle Brush your teeth every morning and night with fluoride toothpaste. Floss one time each day. Exercise for at least 30 minutes 5 or more days each week. Do not use any products that contain nicotine or tobacco. These products include cigarettes, chewing tobacco, and vaping devices, such as e-cigarettes. If you need help quitting, ask your health care provider. Do not use drugs. If you are sexually active, practice safe sex. Use a condom or other form of protection to   prevent STIs. If you do not wish to become pregnant, use a form of birth control. If you plan to become pregnant, see your health care provider for a  prepregnancy visit. Take aspirin only as told by your health care provider. Make sure that you understand how much to take and what form to take. Work with your health care provider to find out whether it is safe and beneficial for you to take aspirin daily. Find healthy ways to manage stress, such as: Meditation, yoga, or listening to music. Journaling. Talking to a trusted person. Spending time with friends and family. Minimize exposure to UV radiation to reduce your risk of skin cancer. Safety Always wear your seat belt while driving or riding in a vehicle. Do not drive: If you have been drinking alcohol. Do not ride with someone who has been drinking. When you are tired or distracted. While texting. If you have been using any mind-altering substances or drugs. Wear a helmet and other protective equipment during sports activities. If you have firearms in your house, make sure you follow all gun safety procedures. Seek help if you have been physically or sexually abused. What's next? Visit your health care provider once a year for an annual wellness visit. Ask your health care provider how often you should have your eyes and teeth checked. Stay up to date on all vaccines. This information is not intended to replace advice given to you by your health care provider. Make sure you discuss any questions you have with your health care provider. Document Revised: 04/12/2021 Document Reviewed: 04/12/2021 Elsevier Patient Education  2024 Elsevier Inc.  

## 2023-04-12 NOTE — Progress Notes (Signed)
[facility] clinic  Provider: Kenard Gower DNP  Code Status:  Full Code  Goals of Care:     02/26/2023   11:00 AM  Advanced Directives  Does Patient Have a Medical Advance Directive? No  Would patient like information on creating a medical advance directive? No - Patient declined     Chief Complaint  Patient presents with   Establish Care    New Patient to establish care.    HPI: Patient is a 47 y.o. female seen today to establish care with [facility]. 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 She complained of coughing with yellowish phlegm for a week now. She stated that she took care of a patient that was coughing a lot last week. She is single and lives with her boyfriend for 18 years now. She has no children. She has 3 brothers and 5 sisters. She has a sister who died from osteosarcoma at the age of 30. Her dad is still living, 65 years old, and has heart failure. Her mother died at 65 years old due to heart failure. She does not smoke nor drink alcohol. She goes to [facility] in [place] for her pap smear. She used to be on Depo provera shots for contraception but was told to stopped due to recent diagnosis of CHF. She was hospitalized 02/26/23 to 03/04/23. She was having shortness of breath, cough wheezing and bilateral lower extremity edema when she went to the hospital. Work up revealed acute systolic CHF and cardiology was consulted. She had left and right heart catheterization which showed normal coronaries with EF ~20% along with marked volume overload and preserved cardiac output. She was diuresed with IV Lasix. She was subsequently changed to oral Lasix.She has a PMH of asthma, allergic rhinitis, chronic bronchitis and obesity.    Past Medical History:  Diagnosis Date   Allergic rhinitis    Asthma    Bronchitis, chronic (HCC)    Pneumonia     Past Surgical History:  Procedure Laterality Date   none     RIGHT/LEFT  HEART CATH AND CORONARY ANGIOGRAPHY N/A 03/01/2023   Procedure: RIGHT/LEFT HEART CATH AND CORONARY ANGIOGRAPHY;  Surgeon: Dolores Patty, MD;  Location: [facility] INVASIVE [facility];  Service: Cardiovascular;  Laterality: N/A;    Allergies  Allergen Reactions   Aspirin     Patient states that it makes her go into an asthma attack.     Outpatient Encounter Medications as of 04/12/2023  Medication Sig   acetaminophen (TYLENOL) 325 MG tablet Take 2 tablets (650 mg total) by mouth every 6 (six) hours as needed for mild pain (or Fever >/= 101).   albuterol (PROVENTIL HFA;VENTOLIN HFA) 108 (90 Base) MCG/ACT inhaler Inhale 2 puffs into the lungs every 4 (four) hours as needed (cough and wheeze). For asthma attacks.   bisoprolol (ZEBETA) 5 MG tablet Take 1/2 tablet (2.5 mg total) by mouth 2 (two) times daily.   budesonide-formoterol (SYMBICORT) 80-4.5 MCG/ACT inhaler Inhale 2 puffs into the lungs 2 (two) times daily.   digoxin (LANOXIN) 0.125 MG tablet Take 1 tablet (0.125 mg total) by mouth daily.   fluticasone (FLONASE) 50 MCG/ACT nasal spray Place 2 sprays into both nostrils daily as needed for allergies or rhinitis.   furosemide (LASIX) 40 MG tablet Take 1 tablet (40 mg total) by mouth daily.   ivabradine (CORLANOR) 5 MG TABS tablet Take 1/2 tablet (2.5 mg total) by mouth 2 (two)  times daily with a meal.   losartan (COZAAR) 25 MG tablet Take 1/2 tablet (12.5 mg total) by mouth daily.   montelukast (SINGULAIR) 10 MG tablet Take 1 tablet (10 mg total) by mouth at bedtime.   predniSONE (DELTASONE) 10 MG tablet Take 6 tablets by mouth on day 1, 5 tablets on day 2, 4 tablets on day 3, 3 tablets on day 4, 2 tablets on day 5, 1 tablet on day 6 and stop on day 7   spironolactone (ALDACTONE) 25 MG tablet Take 1 tablet (25 mg total) by mouth daily.   [DISCONTINUED] fluticasone (FLONASE) 50 MCG/ACT nasal spray Place 2 sprays into both nostrils daily.   No facility-administered encounter medications on file as of  04/12/2023.    Review of Systems:  Review of Systems  Constitutional:  Negative for appetite change, chills, fatigue and fever.  HENT:  Negative for congestion, hearing loss, rhinorrhea and sore throat.   Eyes: Negative.   Respiratory:  Positive for cough. Negative for shortness of breath and wheezing.        Has yellowish phlegm  Cardiovascular:  Negative for chest pain, palpitations and leg swelling.  Gastrointestinal:  Negative for abdominal pain, constipation, diarrhea, nausea and vomiting.  Genitourinary:  Negative for dysuria.  Musculoskeletal:  Negative for arthralgias, back pain and myalgias.  Skin:  Negative for color change, rash and wound.  Neurological:  Negative for dizziness, weakness and headaches.  Psychiatric/Behavioral:  Negative for behavioral problems. The patient is not nervous/anxious.     Health Maintenance  Topic Date Due   COVID-19 Vaccine (1) Never done   Hepatitis C Screening  Never done   DTaP/Tdap/Td (1 - Tdap) Never done   PAP SMEAR-Modifier  Never done   Colonoscopy  Never done   INFLUENZA VACCINE  05/30/2023   HIV Screening  Completed   HPV VACCINES  Aged Out    Physical Exam: Vitals:   04/12/23 0908  BP: 132/78  Pulse: 80  Resp: 20  Temp: 97.7 F (36.5 C)  Weight: 255 lb 12.8 oz (116 kg)  Height: 5\' 1"  (1.549 m)   Body mass index is 48.33 kg/m. Physical Exam Constitutional:      Appearance: She is obese.     Comments: Morbidly obese  HENT:     Head: Normocephalic and atraumatic.     Nose: Nose normal.     Mouth/Throat:     Mouth: Mucous membranes are moist.  Eyes:     Conjunctiva/sclera: Conjunctivae normal.  Cardiovascular:     Rate and Rhythm: Normal rate and regular rhythm.  Pulmonary:     Effort: Pulmonary effort is normal.     Breath sounds: Normal breath sounds.  Abdominal:     General: Bowel sounds are normal.     Palpations: Abdomen is soft.  Musculoskeletal:        General: Normal range of motion.     Cervical  back: Normal range of motion.  Skin:    General: Skin is warm and dry.  Neurological:     General: No focal deficit present.     Mental Status: She is alert and oriented to person, place, and time.  Psychiatric:        Mood and Affect: Mood normal.        Behavior: Behavior normal.        Thought Content: Thought content normal.        Judgment: Judgment normal.     Labs reviewed: Basic Metabolic Panel:  Recent Labs    03/01/23 1220 03/02/23 0105 03/02/23 0600 03/02/23 1048 03/03/23 0050 03/04/23 0049 03/12/23 1038  NA 137 132*  --    < > 132* 131* 139  K 3.8 5.2* 4.3   < > 4.0 4.0 3.5  CL 102 93*  --    < > 95* 93* 104  CO2 23 22  --    < > 25 25 22   GLUCOSE 108* 104*  --    < > 122* 108* 81  BUN 19 34*  --    < > 41* 46* 21*  CREATININE 1.04* 1.29*  --    < > 1.23* 1.22* 0.83  CALCIUM 9.4 10.4*  --    < > 9.3 9.2 9.4  MG  --  2.6* 2.5*  --  2.7* 2.7*  --   PHOS 4.4 7.4*  --   --  6.7* 5.0*  --   TSH 2.339  --   --   --   --   --   --    < > = values in this interval not displayed.   Liver Function Tests: Recent Labs    03/02/23 0105 03/03/23 0050 03/04/23 0049  AST 32 29 25  ALT 35 31 30  ALKPHOS 50 44 42  BILITOT 0.6 0.9 0.9  PROT 8.0 7.4 7.3  ALBUMIN 4.4 3.8 3.6   No results for input(s): "LIPASE", "AMYLASE" in the last 8760 hours. No results for input(s): "AMMONIA" in the last 8760 hours. CBC: Recent Labs    03/02/23 0105 03/03/23 0050 03/04/23 0049  WBC 14.3* 12.1* 11.0*  NEUTROABS 10.9* 9.2* 8.3*  HGB 16.4* 15.4* 16.0*  HCT 47.8* 46.0 48.7*  MCV 81.4 82.1 83.1  PLT 433* 393 377   Lipid Panel: No results for input(s): "CHOL", "HDL", "LDLCALC", "TRIG", "CHOLHDL", "LDLDIRECT" in the last 8760 hours. No results found for: "HGBA1C"  Procedures since last visit: No results found.  Assessment/Plan  1. Encounter to establish care -  established care with [facility]  2. Chronic systolic heart failure (HCC) -  stable -  continue bisoprolol,  Digoxin, Lasix, Corlanor - Lipid panel - Hemoglobin A1C  3. Severe persistent asthma, unspecified whether complicated -  no wheezing -  continue Symbicort, Flonase - fluticasone (FLONASE) 50 MCG/ACT nasal spray; Place 2 sprays into both nostrils daily as needed for allergies or rhinitis. - budesonide-formoterol (SYMBICORT) 80-4.5 MCG/ACT inhaler; Inhale 2 puffs into the lungs 2 (two) times daily as needed. - CBC With Differential/Platelet - Lipid panel - Hemoglobin A1C  4. Primary hypertension -  BP 132/78, stable -  continue Losartan - Complete Metabolic Panel with eGFR - Lipid panel - Hemoglobin A1C  5. Morbid obesity (HCC) -  now a member of [employer], works out 4X/week - Lipid panel - Amb Ref to Medical Weight Management - Hemoglobin A1C  6. Acute bronchitis, unspecified organism - doxycycline (VIBRA-TABS) 100 MG tablet; Take 1 tablet (100 mg total) by mouth 2 (two) times daily for 7 days.  Dispense: 14 tablet; Refill: 0  7. Screening for STD (sexually transmitted disease) - Hepatitis C Antibody    Labs/tests ordered:  CBC, CMP, lipid panel, A1C, Mg and Phos  Next appt:  Visit date not found

## 2023-04-13 LAB — TEST AUTHORIZATION

## 2023-04-13 LAB — CBC WITH DIFFERENTIAL/PLATELET
Absolute Monocytes: 895 cells/uL (ref 200–950)
Basophils Absolute: 37 cells/uL (ref 0–200)
Basophils Relative: 0.5 %
Eosinophils Relative: 4.2 %
HCT: 42.8 % (ref 35.0–45.0)
Hemoglobin: 14.3 g/dL (ref 11.7–15.5)
MCH: 27.6 pg (ref 27.0–33.0)
MCHC: 33.4 g/dL (ref 32.0–36.0)
MCV: 82.6 fL (ref 80.0–100.0)
MPV: 11 fL (ref 7.5–12.5)
Neutrophils Relative %: 37.7 %
Platelets: 303 10*3/uL (ref 140–400)
RBC: 5.18 10*6/uL — ABNORMAL HIGH (ref 3.80–5.10)

## 2023-04-13 LAB — COMPLETE METABOLIC PANEL WITH GFR
AG Ratio: 1.3 (calc) (ref 1.0–2.5)
ALT: 10 U/L (ref 6–29)
AST: 13 U/L (ref 10–35)
Albumin: 3.6 g/dL (ref 3.6–5.1)
Alkaline phosphatase (APISO): 57 U/L (ref 31–125)
BUN: 18 mg/dL (ref 7–25)
CO2: 19 mmol/L — ABNORMAL LOW (ref 20–32)
Calcium: 9.3 mg/dL (ref 8.6–10.2)
Chloride: 107 mmol/L (ref 98–110)
Creat: 0.69 mg/dL (ref 0.50–0.99)
Globulin: 2.8 g/dL (calc) (ref 1.9–3.7)
Glucose, Bld: 82 mg/dL (ref 65–99)
Potassium: 4.1 mmol/L (ref 3.5–5.3)
Sodium: 139 mmol/L (ref 135–146)
Total Bilirubin: 0.2 mg/dL (ref 0.2–1.2)
Total Protein: 6.4 g/dL (ref 6.1–8.1)
eGFR: 109 mL/min/{1.73_m2} (ref >=60)

## 2023-04-13 LAB — MAGNESIUM: Magnesium: 2 mg/dL (ref 1.5–2.5)

## 2023-04-13 LAB — LIPID PANEL
Cholesterol: 184 mg/dL (ref <200)
HDL: 35 mg/dL — ABNORMAL LOW (ref >=50)
LDL Cholesterol (Calc): 128 mg/dL (calc) — ABNORMAL HIGH
Non-HDL Cholesterol (Calc): 149 mg/dL (calc) — ABNORMAL HIGH (ref <130)
Total CHOL/HDL Ratio: 5.3 (calc) — ABNORMAL HIGH (ref <5.0)
Triglycerides: 104 mg/dL (ref <150)

## 2023-04-13 LAB — HEMOGLOBIN A1C
Hgb A1c MFr Bld: 6.3 % of total Hgb — ABNORMAL HIGH (ref <5.7)
Mean Plasma Glucose: 134 mg/dL
eAG (mmol/L): 7.4 mmol/L

## 2023-04-13 LAB — PHOSPHORUS: Phosphorus: 4.2 mg/dL (ref 2.5–4.5)

## 2023-04-13 LAB — HEPATITIS C ANTIBODY: Hepatitis C Ab: NONREACTIVE

## 2023-04-18 NOTE — Progress Notes (Signed)
-    no anemia -  electrolytes and liver enzymes normal -  LDL 128, elevated (normal <161), try eating more of plant based and avoid high fructose foods, avoid, tv dinners, exercise at least 150 minutes/week -  A1C 6.3, ranging as prediabetic

## 2023-04-19 ENCOUNTER — Other Ambulatory Visit (HOSPITAL_COMMUNITY): Payer: Self-pay

## 2023-05-13 ENCOUNTER — Encounter: Payer: No Typology Code available for payment source | Admitting: Adult Health

## 2023-05-13 ENCOUNTER — Encounter: Payer: Self-pay | Admitting: Adult Health

## 2023-05-13 NOTE — Progress Notes (Signed)
 This encounter was created in error - please disregard.

## 2023-06-04 ENCOUNTER — Other Ambulatory Visit (HOSPITAL_COMMUNITY): Payer: Self-pay

## 2023-06-12 ENCOUNTER — Other Ambulatory Visit: Payer: Self-pay

## 2023-06-17 ENCOUNTER — Emergency Department (HOSPITAL_COMMUNITY)
Admission: EM | Admit: 2023-06-17 | Discharge: 2023-06-18 | Disposition: A | Discharge status: Home / Self Care | Payer: No Typology Code available for payment source | Attending: Emergency Medicine | Admitting: Emergency Medicine

## 2023-06-17 ENCOUNTER — Other Ambulatory Visit: Payer: Self-pay

## 2023-06-17 ENCOUNTER — Encounter (HOSPITAL_COMMUNITY): Payer: Self-pay

## 2023-06-17 ENCOUNTER — Emergency Department (HOSPITAL_COMMUNITY): Payer: No Typology Code available for payment source

## 2023-06-17 DIAGNOSIS — J45909 Unspecified asthma, uncomplicated: Secondary | ICD-10-CM | POA: Insufficient documentation

## 2023-06-17 DIAGNOSIS — U071 COVID-19: Secondary | ICD-10-CM | POA: Insufficient documentation

## 2023-06-17 DIAGNOSIS — I509 Heart failure, unspecified: Secondary | ICD-10-CM | POA: Diagnosis not present

## 2023-06-17 DIAGNOSIS — Z7952 Long term (current) use of systemic steroids: Secondary | ICD-10-CM | POA: Insufficient documentation

## 2023-06-17 DIAGNOSIS — R059 Cough, unspecified: Secondary | ICD-10-CM | POA: Diagnosis present

## 2023-06-17 HISTORY — DX: Heart failure, unspecified: I50.9

## 2023-06-17 LAB — COMPREHENSIVE METABOLIC PANEL
ALT: 16 U/L (ref 0–44)
AST: 18 U/L (ref 15–41)
Albumin: 3.3 g/dL — ABNORMAL LOW (ref 3.5–5.0)
Alkaline Phosphatase: 60 U/L (ref 38–126)
Anion gap: 11 (ref 5–15)
BUN: 14 mg/dL (ref 6–20)
CO2: 22 mmol/L (ref 22–32)
Calcium: 9.2 mg/dL (ref 8.9–10.3)
Chloride: 102 mmol/L (ref 98–111)
Creatinine, Ser: 0.89 mg/dL (ref 0.44–1.00)
GFR, Estimated: >60 mL/min (ref >60)
Glucose, Bld: 98 mg/dL (ref 70–99)
Potassium: 4 mmol/L (ref 3.5–5.1)
Sodium: 135 mmol/L (ref 135–145)
Total Bilirubin: 0.4 mg/dL (ref 0.3–1.2)
Total Protein: 7.1 g/dL (ref 6.5–8.1)

## 2023-06-17 LAB — CBC
HCT: 43.7 % (ref 36.0–46.0)
Hemoglobin: 14.2 g/dL (ref 12.0–15.0)
MCH: 27.2 pg (ref 26.0–34.0)
MCHC: 32.5 g/dL (ref 30.0–36.0)
MCV: 83.7 fL (ref 80.0–100.0)
Platelets: 323 10*3/uL (ref 150–400)
RBC: 5.22 MIL/uL — ABNORMAL HIGH (ref 3.87–5.11)
RDW: 15.2 % (ref 11.5–15.5)
WBC: 7.2 10*3/uL (ref 4.0–10.5)
nRBC: 0 % (ref 0.0–0.2)

## 2023-06-17 LAB — RESP PANEL BY RT-PCR (RSV, FLU A&B, COVID)  RVPGX2
Influenza A by PCR: NEGATIVE
Influenza B by PCR: NEGATIVE
Resp Syncytial Virus by PCR: NEGATIVE
SARS Coronavirus 2 by RT PCR: POSITIVE — AB

## 2023-06-17 LAB — HCG, SERUM, QUALITATIVE: Preg, Serum: NEGATIVE

## 2023-06-17 MED ORDER — BENZONATATE 100 MG PO CAPS
100.0000 mg | ORAL_CAPSULE | Freq: Three times a day (TID) | ORAL | 0 refills | Status: DC
  Filled 2023-06-17: qty 21, 7d supply, fill #0

## 2023-06-17 MED ORDER — ONDANSETRON 4 MG PO TBDP
4.0000 mg | ORAL_TABLET | Freq: Three times a day (TID) | ORAL | 0 refills | Status: DC | PRN
  Filled 2023-06-17: qty 10, 4d supply, fill #0

## 2023-06-17 MED ORDER — BENZONATATE 100 MG PO CAPS
100.0000 mg | ORAL_CAPSULE | Freq: Once | ORAL | Status: AC
  Administered 2023-06-17: 100 mg via ORAL
  Filled 2023-06-17: qty 1

## 2023-06-17 MED ORDER — ONDANSETRON 4 MG PO TBDP
4.0000 mg | ORAL_TABLET | Freq: Once | ORAL | Status: AC
  Administered 2023-06-17: 4 mg via ORAL
  Filled 2023-06-17: qty 1

## 2023-06-17 NOTE — Discharge Instructions (Signed)
Take the prescribed medication as directed. °Follow-up with your primary care doctor. °Return to the ED for new or worsening symptoms. °

## 2023-06-17 NOTE — ED Provider Notes (Signed)
[place] EMERGENCY DEPARTMENT AT [facility] Provider Note   CSN: [id number] Arrival date & time: 06/17/23  1408     History {Add pertinent medical, surgical, social history, OB history to HPI:1} Chief Complaint  Patient presents with   Cough    Carolyn Oconnor is a 47 y.o. female.  The history is provided by the patient and medical records.  Cough  47 year old female with history of obesity, asthma, CHF, presenting to the ED with concern of COVID-19.  Patient reports for 3 days now she has had nausea, vomiting, cough, and headache.  She denies any fever or chills.  No chest pain or shortness of breath.  States she was recently around a family member who tested positive a few days after their interaction.  She has not had any home testing thus far.  No medications taken for symptoms prior to arrival.  Home Medications Prior to Admission medications   Medication Sig Start Date End Date Taking? Authorizing Provider  acetaminophen (TYLENOL) 325 MG tablet Take 2 tablets (650 mg total) by mouth every 6 (six) hours as needed for mild pain (or Fever >/= 101). 03/04/23   Marguerita Merles Latif, DO  albuterol (PROVENTIL HFA;VENTOLIN HFA) 108 (90 Base) MCG/ACT inhaler Inhale 2 puffs into the lungs every 4 (four) hours as needed (cough and wheeze). For asthma attacks. 03/09/16   Roslynn Amble, MD  bisoprolol (ZEBETA) 5 MG tablet Take 1/2 tablet (2.5 mg total) by mouth 2 (two) times daily. 03/12/23   Alen Bleacher, NP  budesonide-formoterol (SYMBICORT) 80-4.5 MCG/ACT inhaler Inhale 2 puffs into the lungs 2 (two) times daily as needed.    [provider]  digoxin (LANOXIN) 0.125 MG tablet Take 1 tablet (0.125 mg total) by mouth daily. 03/12/23   Alen Bleacher, NP  fluticasone Aleda Grana) 50 MCG/ACT nasal spray Place 2 sprays into both nostrils daily as needed for allergies or rhinitis.    [provider]  furosemide (LASIX) 40 MG tablet Take 1 tablet (40 mg total) by mouth  daily. 03/12/23   Alen Bleacher, NP  ivabradine (CORLANOR) 5 MG TABS tablet Take 1/2 tablet (2.5 mg total) by mouth 2 (two) times daily with a meal. 03/18/23   Bensimhon, Bevelyn Buckles, MD  losartan (COZAAR) 25 MG tablet Take 1/2 tablet (12.5 mg total) by mouth daily. 03/12/23   Alen Bleacher, NP  montelukast (SINGULAIR) 10 MG tablet Take 1 tablet (10 mg total) by mouth at bedtime. 03/09/16   Roslynn Amble, MD  predniSONE (DELTASONE) 10 MG tablet Take 6 tablets by mouth on day 1, 5 tablets on day 2, 4 tablets on day 3, 3 tablets on day 4, 2 tablets on day 5, 1 tablet on day 6 and stop on day 7 03/04/23   Marguerita Merles Latif, DO  spironolactone (ALDACTONE) 25 MG tablet Take 1 tablet (25 mg total) by mouth daily. 03/12/23   Alen Bleacher, NP      Allergies    Aspirin    Review of Systems   Review of Systems  Respiratory:  Positive for cough.   Gastrointestinal:  Positive for nausea and vomiting.  All other systems reviewed and are negative.   Physical Exam Updated Vital Signs BP (!) 160/119 (BP Location: Right Arm)   Pulse (!) 112   Temp 99.9 F (37.7 C) (Oral)   Resp (!) 22   Ht 5\' 1"  (1.549 m)   Wt 111.1 kg   SpO2 96%  BMI 46.29 kg/m   Physical Exam Vitals and nursing note reviewed.  Constitutional:      Appearance: She is well-developed.  HENT:     Head: Normocephalic and atraumatic.  Eyes:     Conjunctiva/sclera: Conjunctivae normal.     Pupils: Pupils are equal, round, and reactive to light.  Cardiovascular:     Rate and Rhythm: Normal rate and regular rhythm.     Heart sounds: Normal heart sounds.  Pulmonary:     Effort: Pulmonary effort is normal. No respiratory distress.     Breath sounds: Normal breath sounds. No rhonchi.  Abdominal:     General: Bowel sounds are normal.     Palpations: Abdomen is soft.  Musculoskeletal:        General: Normal range of motion.     Cervical back: Normal range of motion.  Skin:    General: Skin is warm and dry.  Neurological:      Mental Status: She is alert and oriented to person, place, and time.     ED Results / Procedures / Treatments   Labs (all labs ordered are listed, but only abnormal results are displayed) Labs Reviewed  RESP PANEL BY RT-PCR (RSV, FLU A&B, COVID)  [id number] - Abnormal; Notable for the following components:      Result Value   SARS Coronavirus 2 by RT PCR POSITIVE (*)    All other components within normal limits  COMPREHENSIVE METABOLIC PANEL - Abnormal; Notable for the following components:   Albumin 3.3 (*)    All other components within normal limits  CBC - Abnormal; Notable for the following components:   RBC 5.22 (*)    All other components within normal limits  HCG, SERUM, QUALITATIVE    EKG EKG Interpretation Date/Time:  Monday June 17 2023 15:33:58 EDT Ventricular Rate:  96 PR Interval:  146 QRS Duration:  90 QT Interval:  348 QTC Calculation: 439 R Axis:   108  Text Interpretation: Normal sinus rhythm Rightward axis Non-specific ST-t changes , similar to prior Abnormal ECG When compared with ECG of 12-Mar-2023 09:26, PREVIOUS ECG IS PRESENT Confirmed by Fulton Reek [phone]) on 06/17/2023 10:20:59 PM  Radiology DG Chest 1 View  Result Date: 06/17/2023 CLINICAL DATA:  47 year old female with shortness of breath. Vomiting, nonproductive cough, headache. EXAM: CHEST  1 VIEW COMPARISON:  Portable chest 02/02/2023 and earlier. FINDINGS: AP view at 1600 hours. Stable cardiac size at the upper limits of normal. Other mediastinal contours are within normal limits. Lung volumes are stable and within normal limits. Visualized tracheal air column is within normal limits. Allowing for portable technique the lungs are clear. No pneumothorax or pleural effusion. Paucity of bowel gas in the upper abdomen. No acute osseous abnormality identified. IMPRESSION: No acute cardiopulmonary abnormality. Electronically Signed   By: Odessa Fleming M.D.   On: 06/17/2023 17:04    Procedures Procedures   {Document cardiac monitor, telemetry assessment procedure when appropriate:1}  Medications Ordered in ED Medications  benzonatate (TESSALON) capsule 100 mg (has no administration in time range)  ondansetron (ZOFRAN-ODT) disintegrating tablet 4 mg (has no administration in time range)    ED Course/ Medical Decision Making/ A&P   {   Click here for ABCD2, HEART and other calculatorsREFRESH Note before signing :1}                              Medical Decision Making Amount and/or Complexity of Data  Reviewed Labs: ordered.  Risk Prescription drug management.   ***  {Document critical care time when appropriate:1} {Document review of labs and clinical decision tools ie heart score, Chads2Vasc2 etc:1}  {Document your independent review of radiology images, and any outside records:1} {Document your discussion with family members, caretakers, and with consultants:1} {Document social determinants of health affecting pt's care:1} {Document your decision making why or why not admission, treatments were needed:1} Final Clinical Impression(s) / ED Diagnoses Final diagnoses:  None    Rx / DC Orders ED Discharge Orders     None

## 2023-06-17 NOTE — ED Provider Triage Note (Signed)
Emergency Medicine Provider Triage Evaluation Note  Carolyn Oconnor , a 47 y.o. female  was evaluated in triage.  Pt complains of contact with a COVID-positive person last week.  She states she had onset of cough, nausea, body aches 3 days ago. Has felt chills.  Denies any shortness of breath or chest pain, denies difficulty breathing  Review of Systems  Positive: As above Negative: As above  Physical Exam  BP (!) 160/119 (BP Location: Right Arm)   Pulse (!) 112   Temp 99.9 F (37.7 C) (Oral)   Resp (!) 22   Ht 5\' 1"  (1.549 m)   Wt 111.1 kg   SpO2 96%   BMI 46.29 kg/m  Gen:   Awake, no distress, dry cough  Resp:  Normal effort  MSK:   Moves extremities without difficulty   Tachycardic on cardiac auscultation  Medical Decision Making  Medically screening exam initiated at 3:46 PM.  Appropriate orders placed.  Carolyn Oconnor was informed that the remainder of the evaluation will be completed by another provider, this initial triage assessment does not replace that evaluation, and the importance of remaining in the ED until their evaluation is complete.     Arabella Merles, PA-C 06/17/23 1552

## 2023-06-17 NOTE — ED Triage Notes (Signed)
Pt c/o vomiting, dry cough, HA started yesterday. Pt states she was around someone that was COVID positive

## 2023-06-18 ENCOUNTER — Telehealth: Payer: Self-pay

## 2023-06-18 ENCOUNTER — Other Ambulatory Visit: Payer: Self-pay

## 2023-06-18 ENCOUNTER — Other Ambulatory Visit (HOSPITAL_COMMUNITY): Payer: Self-pay

## 2023-06-18 NOTE — Transitions of Care (Post Inpatient/ED Visit) (Signed)
06/18/2023  Name: Carolyn Oconnor MRN: [id number] DOB: 1975-12-30  Today's TOC FU Call Status: Today's TOC FU Call Status:: Successful TOC FU Call Completed TOC FU Call Complete Date: 06/18/23  Transition Care Management Follow-up Telephone Call Date of Discharge: 06/18/23 Discharge Facility: [facility]) Type of Discharge: Emergency Department Reason for ED Visit: Other: (Covid) How have you been since you were released from the hospital?: Worse Any questions or concerns?: No  Items Reviewed: Did you receive and understand the discharge instructions provided?: Yes Medications obtained,verified, and reconciled?: Yes (Medications Reviewed) Any new allergies since your discharge?: No Dietary orders reviewed?: NA Do you have support at home?: Yes People in Home: other relative(s)  Medications Reviewed Today: Medications Reviewed Today     Reviewed by Maurice Small, CMA (Certified Medical Assistant) on 06/18/23 at 1534  Med List Status: <None>   Medication Order Taking? Sig Documenting Provider Last Dose Status Informant  acetaminophen (TYLENOL) 325 MG tablet [id number] Yes Take 2 tablets (650 mg total) by mouth every 6 (six) hours as needed for mild pain (or Fever >/= 101). Sheikh, Omair Franklin, DO Taking Active   albuterol (PROVENTIL HFA;VENTOLIN HFA) 108 [id number] Base) MCG/ACT inhaler [id number] Yes Inhale 2 puffs into the lungs every 4 (four) hours as needed (cough and wheeze). For asthma attacks. Roslynn Amble, MD Taking Active Self  benzonatate (TESSALON) 100 MG capsule [id number] Yes Take 1 capsule (100 mg total) by mouth every 8 (eight) hours. Garlon Hatchet, PA-C Taking Active   bisoprolol (ZEBETA) 5 MG tablet [id number] Yes Take 1/2 tablet (2.5 mg total) by mouth 2 (two) times daily. Alen Bleacher, NP Taking Active   budesonide-formoterol [facility]) 80-4.5 MCG/ACT inhaler [id number] Yes Inhale 2 puffs into the lungs 2 (two) times daily as needed. [provider]  Taking Active   digoxin (LANOXIN) 0.125 MG tablet [id number] Yes Take 1 tablet (0.125 mg total) by mouth daily. Alen Bleacher, NP Taking Active   fluticasone [facility]) 50 MCG/ACT nasal spray [id number] Yes Place 2 sprays into both nostrils daily as needed for allergies or rhinitis. [provider] Taking Active   furosemide (LASIX) 40 MG tablet [id number] Yes Take 1 tablet (40 mg total) by mouth daily. Alen Bleacher, NP Taking Active   ivabradine (CORLANOR) 5 MG TABS tablet [id number] Yes Take 1/2 tablet (2.5 mg total) by mouth 2 (two) times daily with a meal. Bensimhon, Bevelyn Buckles, MD Taking Active   losartan (COZAAR) 25 MG tablet [id number] Yes Take 1/2 tablet (12.5 mg total) by mouth daily. Alen Bleacher, NP Taking Active   montelukast (SINGULAIR) 10 MG tablet [id number] Yes Take 1 tablet (10 mg total) by mouth at bedtime. Roslynn Amble, MD Taking Active Self  ondansetron (ZOFRAN-ODT) 4 MG disintegrating tablet [id number] Yes Take 1 tablet (4 mg total) by mouth every 8 (eight) hours as needed for nausea. Garlon Hatchet, PA-C Taking Active   predniSONE (DELTASONE) 10 MG tablet [id number] Yes Take 6 tablets by mouth on day 1, 5 tablets on day 2, 4 tablets on day 3, 3 tablets on day 4, 2 tablets on day 5, 1 tablet on day 6 and stop on day 6 [address], Omair Augusta, DO Taking Active   spironolactone (ALDACTONE) 25 MG tablet [id number] Yes Take 1 tablet (25 mg total) by mouth daily. Alen Bleacher, NP Taking Active             Home Care and Equipment/Supplies: Were Home Health Services Ordered?:  No Any new equipment or medical supplies ordered?: No  Functional Questionnaire: Do you need assistance with bathing/showering or dressing?: No Do you need assistance with meal preparation?: No Do you need assistance with eating?: No Do you have difficulty maintaining continence: No Do you need assistance with getting out of bed/getting out of a chair/moving?: No Do you have difficulty managing or taking  your medications?: No  Follow up appointments reviewed: PCP Follow-up appointment confirmed?: Yes Date of PCP follow-up appointment?: 06/28/23 Follow-up Provider: Kenard Gower, NP Specialist Hospital Follow-up appointment confirmed?: NA Do you need transportation to your follow-up appointment?: No Do you understand care options if your condition(s) worsen?: Yes-patient verbalized understanding

## 2023-06-19 ENCOUNTER — Ambulatory Visit (HOSPITAL_COMMUNITY): Payer: No Typology Code available for payment source

## 2023-06-19 ENCOUNTER — Encounter (HOSPITAL_COMMUNITY): Payer: Self-pay | Admitting: Internal Medicine

## 2023-06-28 ENCOUNTER — Encounter: Payer: No Typology Code available for payment source | Admitting: Adult Health

## 2023-06-28 ENCOUNTER — Encounter: Payer: Self-pay | Admitting: Adult Health

## 2023-07-01 NOTE — Progress Notes (Signed)
This encounter was created in error - please disregard.

## 2023-08-22 ENCOUNTER — Ambulatory Visit (HOSPITAL_COMMUNITY)
Admission: RE | Admit: 2023-08-22 | Discharge: 2023-08-22 | Disposition: A | Discharge status: Home / Self Care | Payer: PRIVATE HEALTH INSURANCE | Source: Ambulatory Visit | Attending: Internal Medicine | Admitting: Internal Medicine

## 2023-08-22 ENCOUNTER — Other Ambulatory Visit (HOSPITAL_COMMUNITY): Payer: Self-pay

## 2023-08-22 ENCOUNTER — Encounter (HOSPITAL_COMMUNITY): Payer: Self-pay | Admitting: Internal Medicine

## 2023-08-22 ENCOUNTER — Ambulatory Visit (HOSPITAL_BASED_OUTPATIENT_CLINIC_OR_DEPARTMENT_OTHER)
Admission: RE | Admit: 2023-08-22 | Discharge: 2023-08-22 | Disposition: A | Discharge status: Home / Self Care | Payer: PRIVATE HEALTH INSURANCE | Source: Ambulatory Visit | Attending: Internal Medicine | Admitting: Internal Medicine

## 2023-08-22 VITALS — BP 121/91 | HR 83 | Wt 252.2 lb

## 2023-08-22 DIAGNOSIS — I5022 Chronic systolic (congestive) heart failure: Secondary | ICD-10-CM | POA: Diagnosis present

## 2023-08-22 DIAGNOSIS — I428 Other cardiomyopathies: Secondary | ICD-10-CM | POA: Diagnosis not present

## 2023-08-22 DIAGNOSIS — J4489 Other specified chronic obstructive pulmonary disease: Secondary | ICD-10-CM | POA: Diagnosis not present

## 2023-08-22 DIAGNOSIS — I4729 Other ventricular tachycardia: Secondary | ICD-10-CM

## 2023-08-22 DIAGNOSIS — Z79899 Other long term (current) drug therapy: Secondary | ICD-10-CM | POA: Diagnosis not present

## 2023-08-22 DIAGNOSIS — Z6841 Body Mass Index (BMI) 40.0 and over, adult: Secondary | ICD-10-CM | POA: Diagnosis not present

## 2023-08-22 DIAGNOSIS — Z7984 Long term (current) use of oral hypoglycemic drugs: Secondary | ICD-10-CM | POA: Diagnosis not present

## 2023-08-22 LAB — HEMOGLOBIN A1C
Hgb A1c MFr Bld: 5.6 % (ref 4.8–5.6)
Mean Plasma Glucose: 114.02 mg/dL

## 2023-08-22 LAB — ECHOCARDIOGRAM COMPLETE
Area-P 1/2: 5.66 cm2
MV M vel: 4.92 m/s
MV Peak grad: 96.8 mm[Hg]
S' Lateral: 5.1 cm
Single Plane A4C EF: 26.1 %

## 2023-08-22 LAB — BASIC METABOLIC PANEL
Anion gap: 9 (ref 5–15)
BUN: 17 mg/dL (ref 6–20)
CO2: 23 mmol/L (ref 22–32)
Calcium: 9.6 mg/dL (ref 8.9–10.3)
Chloride: 108 mmol/L (ref 98–111)
Creatinine, Ser: 0.82 mg/dL (ref 0.44–1.00)
GFR, Estimated: >60 mL/min (ref >60)
Glucose, Bld: 84 mg/dL (ref 70–99)
Potassium: 4.3 mmol/L (ref 3.5–5.1)
Sodium: 140 mmol/L (ref 135–145)

## 2023-08-22 LAB — BRAIN NATRIURETIC PEPTIDE: B Natriuretic Peptide: 140.4 pg/mL — ABNORMAL HIGH (ref 0.0–100.0)

## 2023-08-22 MED ORDER — DAPAGLIFLOZIN PROPANEDIOL 10 MG PO TABS
10.0000 mg | ORAL_TABLET | Freq: Every day | ORAL | 6 refills | Status: DC
  Filled 2023-08-22 – 2023-10-22 (×2): qty 30, 30d supply, fill #0

## 2023-08-22 NOTE — Progress Notes (Signed)
  Echocardiogram 2D Echocardiogram has been performed.  Carolyn Oconnor 08/22/2023, 9:35 AM

## 2023-08-22 NOTE — Progress Notes (Signed)
ReDS Vest / Clip - 08/22/23 1000       ReDS Vest / Clip   Station Marker B    Ruler Value 35.5    ReDS Value Range High volume overload    ReDS Actual Value 42

## 2023-08-22 NOTE — Progress Notes (Addendum)
ADVANCED HF CLINIC NOTE  Primary Care: Medina-Vargas, Margit Banda, NP AHF Cardiologist: Dr. Gala Romney, MD  HPI: Ms. Schreiter is a 47 y.o.  AAF w/ h/o asthma, obesity and recently diagnosed systolic heart failure.   Admitted 4/30- 03/04/23 with acute systolic HF. Presented to the ED w/ several weeks progressive SOB, orthopnea and b/l LE. Also felt to have component of acute asthma exacerbation. Diuresed with IV Lasix, started prednisone and bronchodilators. 2D echo was completed and showed severely reduced LVEF 20-25%, mild- mod MR, RV hyperdynamic. L/RHC: normal coronaries, marked volume overload with persevered CO. cMRI 5/24: LVEF 25%, RVEF 42%. Non ischemic cardiomyopathy. GDMT started. Discharge weight 248lbs.   Here for f/u. Works as a Lawyer at [employer]. Feeling better. Denies SOB. No edema, orthopnea or PND. Compliant with meds. Trying to lose weight.   Echo today 08/22/23 EF ~30-35% RV ok (limited images) Personally reviewed   Cardiac studies reviewed: - [facility] 5/24: Normal cors RA 13 PA 59/42 (48) PCWP 33 CO/CI (Fick) 6.2/2.9 PVR 2.4 WU PAPi 1.3  - cMRI 5/24: LVEF 25%, RVEF 42%. Non ischemic cardiomyopathy  - Echo 4/24: LVEF 20-25%, mild- mod MR, RV hyperdynamic.    Past Medical History:  Diagnosis Date   Allergic rhinitis    Asthma    Bronchitis, chronic (HCC)    CHF (congestive heart failure) (HCC)    Pneumonia     Current Outpatient Medications  Medication Sig Dispense Refill   acetaminophen (TYLENOL) 325 MG tablet Take 2 tablets (650 mg total) by mouth every 6 (six) hours as needed for mild pain (or Fever >/= 101). 20 tablet 0   albuterol (PROVENTIL HFA;VENTOLIN HFA) 108 (90 Base) MCG/ACT inhaler Inhale 2 puffs into the lungs every 4 (four) hours as needed (cough and wheeze). For asthma attacks. 1 Inhaler 3   benzonatate (TESSALON) 100 MG capsule Take 1 capsule (100 mg total) by mouth every 8 (eight) hours. 21 capsule 0   bisoprolol (ZEBETA) 5 MG tablet Take 1/2  tablet (2.5 mg total) by mouth 2 (two) times daily. 30 tablet 6   budesonide-formoterol (SYMBICORT) 80-4.5 MCG/ACT inhaler Inhale 2 puffs into the lungs 2 (two) times daily as needed.     digoxin (LANOXIN) 0.125 MG tablet Take 1 tablet (0.125 mg total) by mouth daily. 30 tablet 3   fluticasone (FLONASE) 50 MCG/ACT nasal spray Place 2 sprays into both nostrils daily as needed for allergies or rhinitis.     furosemide (LASIX) 40 MG tablet Take 1 tablet (40 mg total) by mouth daily. 30 tablet 3   ivabradine (CORLANOR) 5 MG TABS tablet Take 1/2 tablet (2.5 mg total) by mouth 2 (two) times daily with a meal. 60 tablet 3   losartan (COZAAR) 25 MG tablet Take 1/2 tablet (12.5 mg total) by mouth daily. 30 tablet 3   montelukast (SINGULAIR) 10 MG tablet Take 1 tablet (10 mg total) by mouth at bedtime. 30 tablet 3   ondansetron (ZOFRAN-ODT) 4 MG disintegrating tablet Take 1 tablet (4 mg total) by mouth every 8 (eight) hours as needed for nausea. 10 tablet 0   predniSONE (DELTASONE) 10 MG tablet Take 6 tablets by mouth on day 1, 5 tablets on day 2, 4 tablets on day 3, 3 tablets on day 4, 2 tablets on day 5, 1 tablet on day 6 and stop on day 7 21 tablet 0   spironolactone (ALDACTONE) 25 MG tablet Take 1 tablet (25 mg total) by mouth daily. 30 tablet 3  No current facility-administered medications for this encounter.    Allergies  Allergen Reactions   Aspirin     Patient states that it makes her go into an asthma attack.       Social History   Socioeconomic History   Marital status: Significant Other    Spouse name: Not on file   Number of children: 0   Years of education: Not on file   Highest education level: High school graduate  Occupational History   Occupation: [employer]  Tobacco Use   Smoking status: Never    Passive exposure: Yes   Smokeless tobacco: Never   Tobacco comments:    Secondhand from Father  Vaping Use   Vaping status: Never Used  Substance and Sexual  Activity   Alcohol use: No    Alcohol/week: 0.0 standard drinks of alcohol   Drug use: No    Frequency: 1.0 times per week   Sexual activity: Not on file  Other Topics Concern   Not on file  Social History Narrative   Single, lives with boyfriend   CNA, Editor, commissioning   No recent travel      Adult nurse Pulmonary:   Originally from [place]. Always lived in [place]. Previously hasn't traveled outside of the state. 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 No pets currently. She does have carpet & draperies in her home as well as the bedroom. No indoor plants. No bird exposure. No mold exposure. Enjoys drawing & reading.       Diet: Left Blank      Caffeine: Yes      Married, Single if yes what year:       Do you live in a house, apartment, assisted living, condo, trailer, ect: apartment      Is it one or more stories: Left Blank      How many persons live in your home? 2      Pets: no      Highest level or education completed: [employer]      Current/Past profession: Computer      Exercise:  Yes                Type and how often: 2 time a day         Living Will: Yes   DNR: Yes   POA/HPOA: Yes      Functional Status:   Do you have difficulty bathing or dressing yourself? No   Do you have difficulty preparing food or eating? No   Do you have difficulty managing your medications? No   Do you have difficulty managing your finances? No   Do you have difficulty affording your medications? No   Social Determinants of Health   Financial Resource Strain: High Risk (03/12/2023)   Overall Financial Resource Strain (CARDIA)    Difficulty of Paying Living Expenses: Very hard  Food Insecurity: No Food Insecurity (02/26/2023)   Hunger Vital Sign    Worried About Running Out of Food in the Last Year: Never true    Ran Out of Food in the Last Year: Never true  Transportation Needs: No Transportation Needs (02/27/2023)   PRAPARE - Scientist, research (physical sciences) (Medical): No    Lack of Transportation (Non-Medical): No  Physical Activity: Not on file  Stress: Not on file  Social Connections: Not on file  Intimate Partner Violence: Not At Risk (02/26/2023)   Humiliation,  Afraid, Rape, and Kick questionnaire    Fear of Current or Ex-Partner: No    Emotionally Abused: No    Physically Abused: No    Sexually Abused: No     Family History  Problem Relation Age of Onset   Allergies Mother    Heart disease Mother    Allergies Father    Asthma Father    Diabetes Sister    Asthma Sister    Allergies Sister    Cancer Sister    Asthma Brother    Allergies Brother    Heart disease Maternal Aunt    Breast cancer Maternal Aunt     Vitals:   08/22/23 0958  BP: (!) 121/91  Pulse: 83  SpO2: 97%  Weight: 114.4 kg (252 lb 3.2 oz)    Wt Readings from Last 3 Encounters:  08/22/23 114.4 kg (252 lb 3.2 oz)  06/17/23 111.1 kg (245 lb)  04/12/23 116 kg (255 lb 12.8 oz)   PHYSICAL EXAM: General:  Well appearing. No resp difficulty HEENT: normal Neck: supple. no JVD. Carotids 2+ bilat; no bruits. No lymphadenopathy or thryomegaly appreciated. Cor: PMI nondisplaced. Regular rate & rhythm. No rubs, gallops or murmurs. Lungs: clear Abdomen: obese soft, nontender, nondistended. No hepatosplenomegaly. No bruits or masses. Good bowel sounds. Extremities: no cyanosis, clubbing, rash, edema Neuro: alert & orientedx3, cranial nerves grossly intact. moves all 4 extremities w/o difficulty. Affect pleasant   ASSESSMENT & PLAN:  1. Chronic Systolic Heart Failure, Nonischemic CM - new diagnosis 4/24 - Echo EF 20-25% (looks closer to 30-35%), RV hyperdynamic  - R/LHC 5/3: no CAD, RA 13, PA 59/42 (48), PCW 33, LV EDP 35. Fick CO 6.13, CI 2.87. Thermo CO/CI: 6.3/2.9. PAPi 1.3 - cMRI 5/24: LVEF 25%, RVEF 42%. Non ischemic cardiomyopathy.  - Echo today 08/22/23 EF 30-35% RV ok (limited images) Personally reviewed - Etiology uncertain. - NYHA  class II volume hard to assess on exam. Check ReDS - Continue 40 lasix daily PO.  - continue spiro 25 mg daily  - continue losartan 12.5 mg daily   - continue digoxin 0.125 mg daily, check level at next visit - Continue corlanor 2.5 mg BID - Continue bisoprolol 2.5 mg BID - Add Farxiga 10  - Labs today - Refer to PharmD program for rapid med titration - EF improving. No ICD yet   2. Morbid obesity - Body mass index is Body mass index is 47.65 kg/m. - consider GLP1RA, refer to [facility] - Consider sleep study, wants to think about it for now   3. H/o NSVT - Continue GDMT  Arvilla Meres, MD  9:32 AM   Addendum: ReDS 42%. SGLT2i added.   Arvilla Meres, MD  9:33 AM

## 2023-08-22 NOTE — Patient Instructions (Signed)
Medication Changes:  START Farxiga 10 mg Daily  Lab Work:  Labs done today, your results will be available in MyChart, we will contact you for abnormal readings.  Testing/Procedures:  none  Referrals:  You have been referred to [facility]  Special Instructions // Education:  Do the following things EVERYDAY: Weigh yourself in the morning before breakfast. Write it down and keep it in a log. Take your medicines as prescribed Eat low salt foods--Limit salt (sodium) to 2000 mg per day.  Stay as active as you can everyday Limit all fluids for the day to less than 2 liters   Follow-Up in:   Please follow up with our heart failure pharmacist in 2-3 weeks  Your physician recommends that you schedule a follow-up appointment in: 3 months   At the [facility], you and your health needs are our priority. We have a designated team specialized in the treatment of Heart Failure. This Care Team includes your primary Heart Failure Specialized Cardiologist (physician), Advanced Practice Providers (APPs- Physician Assistants and Nurse Practitioners), and Pharmacist who all work together to provide you with the care you need, when you need it.   You may see any of the following providers on your designated Care Team at your next follow up:  Dr. Arvilla Meres Dr. Marca Ancona Dr. Dorthula Nettles Dr. Theresia Bough Tonye Becket, NP Robbie Lis, [place] [facility] [place], [place] Brynda Peon, NP [place] Lee, NP Karle Plumber, PharmD   Please be sure to bring in all your medications bottles to every appointment.   Need to Contact us:  If you have any questions or concerns before your next appointment please send [place] a message through [place] or call our office at [phone].    TO LEAVE A MESSAGE FOR THE NURSE SELECT OPTION 2, PLEASE LEAVE A MESSAGE INCLUDING: YOUR NAME DATE OF BIRTH CALL BACK NUMBER REASON FOR CALL**this is important as we  prioritize the call backs  YOU WILL RECEIVE A CALL BACK THE SAME DAY AS LONG AS YOU CALL BEFORE 4:00 PM

## 2023-08-26 ENCOUNTER — Telehealth (HOSPITAL_COMMUNITY): Payer: Self-pay | Admitting: Pharmacy Technician

## 2023-08-26 ENCOUNTER — Other Ambulatory Visit (HOSPITAL_COMMUNITY): Payer: Self-pay

## 2023-08-26 NOTE — Telephone Encounter (Signed)
Patient Advocate Encounter   Received notification from [employer] that prior authorization for Marcelline Deist is required.   PA submitted on CoverMyMeds Key [id number] Status is pending   Will continue to follow.

## 2023-09-02 ENCOUNTER — Encounter (INDEPENDENT_AMBULATORY_CARE_PROVIDER_SITE_OTHER): Payer: No Typology Code available for payment source | Admitting: Family Medicine

## 2023-09-03 ENCOUNTER — Other Ambulatory Visit (HOSPITAL_COMMUNITY): Payer: Self-pay

## 2023-09-03 NOTE — Telephone Encounter (Signed)
Advanced Heart Failure Patient Advocate Encounter  [employer] requested more clinical information. This was sent via efax to, [phone].

## 2023-09-09 NOTE — Telephone Encounter (Signed)
Advanced Heart Failure Patient Advocate Encounter  Prior Authorization for Carolyn Oconnor has been approved.    PA# [id number] Effective dates: 09/08/23 through 09/08/24  Archer Asa, CPhT

## 2023-09-18 ENCOUNTER — Other Ambulatory Visit (HOSPITAL_COMMUNITY): Payer: PRIVATE HEALTH INSURANCE

## 2023-09-18 ENCOUNTER — Other Ambulatory Visit (HOSPITAL_COMMUNITY): Payer: Self-pay

## 2023-10-22 ENCOUNTER — Other Ambulatory Visit (HOSPITAL_COMMUNITY): Payer: Self-pay

## 2023-11-11 NOTE — Progress Notes (Signed)
ADVANCED HF CLINIC NOTE  Primary Care: Medina-Vargas, Margit Banda, NP AHF Cardiologist: Dr. Gala Romney, MD  HPI: Ms. Mescall is a 48 y.o.  AAF w/ h/o asthma, obesity and recently diagnosed systolic heart failure.   Admitted 4/30- 03/04/23 with acute systolic HF. Presented to the ED w/ several weeks progressive SOB, orthopnea and b/l LE. Also felt to have component of acute asthma exacerbation. Diuresed with IV Lasix, started prednisone and bronchodilators. 2D echo was completed and showed severely reduced LVEF 20-25%, mild- mod MR, RV hyperdynamic. L/RHC: normal coronaries, marked volume overload with persevered CO. cMRI 5/24: LVEF 25%, RVEF 42%. Non ischemic cardiomyopathy. GDMT started. Discharge weight 248lbs.   Today she returns for AHF follow up. Overall feeling good. Denies palpitations, CP, dizziness, edema, or PND/Orthopnea. Denies SOB but notably SOB walking into the office. Appetite ok. No fever or chills. Weight at home 252 pounds. Taking all medications. Denies ETOH, tobacco or drug use.  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 Last took additional lasix last weekend, does not do this frequently. Drinks around 4 bottles of water a day. Cooks her meals for the most part and eats lots of fruit.   Cardiac studies reviewed: - Echo 08/22/23 EF ~30-35% RV ok (limited images)  - L/RHC 5/24: Normal cors. RA 13, PA 59/42 (48), PCWP 33, CO/CI (Fick) 6.2/2.9, PVR 2.4 WU, PAPi 1.3 - cMRI 5/24: LVEF 25%, RVEF 42%. Non ischemic cardiomyopathy - Echo 4/24: LVEF 20-25%, mild- mod MR, RV hyperdynamic.    Past Medical History:  Diagnosis Date   Allergic rhinitis    Asthma    Bronchitis, chronic (HCC)    CHF (congestive heart failure) (HCC)    Pneumonia     Current Outpatient Medications  Medication Sig Dispense Refill   acetaminophen (TYLENOL) 325 MG tablet Take 2 tablets (650 mg total) by mouth every 6 (six) hours as needed for mild pain (or Fever >/= 101).  20 tablet 0   albuterol (PROVENTIL HFA;VENTOLIN HFA) 108 (90 Base) MCG/ACT inhaler Inhale 2 puffs into the lungs every 4 (four) hours as needed (cough and wheeze). For asthma attacks. 1 Inhaler 3   benzonatate (TESSALON) 100 MG capsule Take 1 capsule (100 mg total) by mouth every 8 (eight) hours. 21 capsule 0   bisoprolol (ZEBETA) 5 MG tablet Take 1/2 tablet (2.5 mg total) by mouth 2 (two) times daily. 30 tablet 6   budesonide-formoterol (SYMBICORT) 80-4.5 MCG/ACT inhaler Inhale 2 puffs into the lungs 2 (two) times daily as needed.     dapagliflozin propanediol (FARXIGA) 10 MG TABS tablet Take 1 tablet (10 mg total) by mouth daily before breakfast. 30 tablet 6   digoxin (LANOXIN) 0.125 MG tablet Take 1 tablet (0.125 mg total) by mouth daily. 30 tablet 3   fluticasone (FLONASE) 50 MCG/ACT nasal spray Place 2 sprays into both nostrils daily as needed for allergies or rhinitis.     ivabradine (CORLANOR) 5 MG TABS tablet Take 1/2 tablet (2.5 mg total) by mouth 2 (two) times daily with a meal. 60 tablet 3   montelukast (SINGULAIR) 10 MG tablet Take 1 tablet (10 mg total) by mouth at bedtime. 30 tablet 3   spironolactone (ALDACTONE) 25 MG tablet Take 1 tablet (25 mg total) by mouth daily. 30 tablet 3   furosemide (LASIX) 40 MG tablet Take 1 tablet (40 mg total) by mouth 2 (two) times daily. 180 tablet 3   losartan (COZAAR) 25 MG  tablet Take 1 tablet (25 mg total) by mouth daily. 90 tablet 3   ondansetron (ZOFRAN-ODT) 4 MG disintegrating tablet Take 1 tablet (4 mg total) by mouth every 8 (eight) hours as needed for nausea. (Patient not taking: Reported on 11/21/2023) 10 tablet 0   predniSONE (DELTASONE) 10 MG tablet Take 6 tablets by mouth on day 1, 5 tablets on day 2, 4 tablets on day 3, 3 tablets on day 4, 2 tablets on day 5, 1 tablet on day 6 and stop on day 7 (Patient not taking: Reported on 11/21/2023) 21 tablet 0   No current facility-administered medications for this encounter.    Allergies   Allergen Reactions   Aspirin     Patient states that it makes her go into an asthma attack.       Social History   Socioeconomic History   Marital status: Significant Other    Spouse name: Not on file   Number of children: 0   Years of education: Not on file   Highest education level: High school graduate  Occupational History   Occupation: [employer]  Tobacco Use   Smoking status: Never    Passive exposure: Yes   Smokeless tobacco: Never   Tobacco comments:    Secondhand from Father  Vaping Use   Vaping status: Never Used  Substance and Sexual Activity   Alcohol use: No    Alcohol/week: 0.0 standard drinks of alcohol   Drug use: No    Frequency: 1.0 times per week   Sexual activity: Not on file  Other Topics Concern   Not on file  Social History Narrative   Single, lives with boyfriend   CNA, Editor, commissioning   No recent travel      Adult nurse Pulmonary:   Originally from [place]. Always lived in [place]. Previously hasn't traveled outside of the state. 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 No pets currently. She does have carpet & draperies in her home as well as the bedroom. No indoor plants. No bird exposure. No mold exposure. Enjoys drawing & reading.       Diet: Left Blank      Caffeine: Yes      Married, Single if yes what year:       Do you live in a house, apartment, assisted living, condo, trailer, ect: apartment      Is it one or more stories: Left Blank      How many persons live in your home? 2      Pets: no      Highest level or education completed: [employer]      Current/Past profession: Computer      Exercise:  Yes                Type and how often: 2 time a day         Living Will: Yes   DNR: Yes   POA/HPOA: Yes      Functional Status:   Do you have difficulty bathing or dressing yourself? No   Do you have difficulty preparing food or eating? No   Do you have difficulty managing your  medications? No   Do you have difficulty managing your finances? No   Do you have difficulty affording your medications? No   Social Drivers of Corporate investment banker Strain: High Risk (03/12/2023)   Overall Financial Resource Strain (CARDIA)    Difficulty  of Paying Living Expenses: Very hard  Food Insecurity: No Food Insecurity (02/26/2023)   Hunger Vital Sign    Worried About Running Out of Food in the Last Year: Never true    Ran Out of Food in the Last Year: Never true  Transportation Needs: No Transportation Needs (02/27/2023)   PRAPARE - Administrator, Civil Service (Medical): No    Lack of Transportation (Non-Medical): No  Physical Activity: Not on file  Stress: Not on file  Social Connections: Not on file  Intimate Partner Violence: Not At Risk (02/26/2023)   Humiliation, Afraid, Rape, and Kick questionnaire    Fear of Current or Ex-Partner: No    Emotionally Abused: No    Physically Abused: No    Sexually Abused: No     Family History  Problem Relation Age of Onset   Allergies Mother    Heart disease Mother    Allergies Father    Asthma Father    Diabetes Sister    Asthma Sister    Allergies Sister    Cancer Sister    Asthma Brother    Allergies Brother    Heart disease Maternal Aunt    Breast cancer Maternal Aunt     Vitals:   11/21/23 0921  BP: (!) 122/90  Pulse: 88  SpO2: 95%  Weight: 112.5 kg (248 lb)  Height: 5\' 1"  (1.549 m)   Wt Readings from Last 3 Encounters:  11/21/23 112.5 kg (248 lb)  08/22/23 114.4 kg (252 lb 3.2 oz)  06/17/23 111.1 kg (245 lb)   PHYSICAL EXAM: General:  well appearing.  Walked into clinic. +conversational dyspnea HEENT: normal Neck: supple. JVD difficult to see d/t body habitus. Carotids 2+ bilat; no bruits. No lymphadenopathy or thyromegaly appreciated. Cor: PMI nondisplaced. Regular rate & rhythm. No rubs, gallops or murmurs. Lungs: inspiratory wheezing, diminished bases Abdomen: obese, soft,  nontender, nondistended. No hepatosplenomegaly. No bruits or masses. Good bowel sounds. Extremities: no cyanosis, clubbing, rash, non-pitting BLE edema  Neuro: alert & oriented x 3, cranial nerves grossly intact. moves all 4 extremities w/o difficulty. Affect pleasant.   ReDs reading: 44 %, abnormal/high    ASSESSMENT & PLAN: 1. Chronic Systolic Heart Failure, Nonischemic CM - Diagnosed 4/24 - Echo EF 20-25% (looks closer to 30-35%), RV hyperdynamic  - R/LHC 5/3: no CAD, RA 13, PA 59/42 (48), PCW 33, LV EDP 35. Fick CO 6.13, CI 2.87. Thermo CO/CI: 6.3/2.9. PAPi 1.3 - cMRI 5/24: LVEF 25%, RVEF 42%. Non ischemic cardiomyopathy.  - Echo 08/22/23 EF 30-35% RV ok (limited images)  - Etiology uncertain. - NYHA class IIIa volume hard to assess on exam. ReDS 44% - Increase 40 lasix daily>BID PO. Compliance with meds/diuretics questionable.   - Continue spiro 25 mg daily  - Increase losartan 12.5>25 mg daily  BMET, BNP today. Repeat BMET 7-10 days - Continue digoxin 0.125 mg daily, check level at next visit already took this morning - Continue corlanor 2.5 mg BID - Continue bisoprolol 2.5 mg BID - Continue Farxiga 10. Denies GU symptoms  - EF improving. No ICD yet   2. Morbid obesity - Body mass index is Body mass index is 46.86 kg/m. - Will refer to PharmD for GLP1RA. Check A1c today - Consider sleep study, does not want it at this time   3. H/o NSVT - Continue GDMT  Follow up 4-6 with APP to reassess volume.   Alen Bleacher, NP  9:58 AM

## 2023-11-20 ENCOUNTER — Telehealth (HOSPITAL_COMMUNITY): Payer: Self-pay

## 2023-11-20 NOTE — Telephone Encounter (Signed)
Called and left patient a voice message to confirm/remind patient of their appointment at the [facility] on 11/21/23.

## 2023-11-21 ENCOUNTER — Other Ambulatory Visit (HOSPITAL_COMMUNITY): Payer: Self-pay

## 2023-11-21 ENCOUNTER — Encounter (HOSPITAL_COMMUNITY): Payer: Self-pay

## 2023-11-21 ENCOUNTER — Ambulatory Visit (HOSPITAL_COMMUNITY)
Admission: RE | Admit: 2023-11-21 | Discharge: 2023-11-21 | Disposition: A | Discharge status: Home / Self Care | Payer: PRIVATE HEALTH INSURANCE | Source: Ambulatory Visit | Attending: Internal Medicine | Admitting: Internal Medicine

## 2023-11-21 VITALS — BP 122/90 | HR 88 | Ht 61.0 in | Wt 248.0 lb

## 2023-11-21 DIAGNOSIS — Z6841 Body Mass Index (BMI) 40.0 and over, adult: Secondary | ICD-10-CM | POA: Diagnosis not present

## 2023-11-21 DIAGNOSIS — I4729 Other ventricular tachycardia: Secondary | ICD-10-CM | POA: Diagnosis not present

## 2023-11-21 DIAGNOSIS — I5022 Chronic systolic (congestive) heart failure: Secondary | ICD-10-CM

## 2023-11-21 DIAGNOSIS — I428 Other cardiomyopathies: Secondary | ICD-10-CM | POA: Diagnosis not present

## 2023-11-21 DIAGNOSIS — J45909 Unspecified asthma, uncomplicated: Secondary | ICD-10-CM | POA: Diagnosis not present

## 2023-11-21 LAB — BASIC METABOLIC PANEL
Anion gap: 8 (ref 5–15)
BUN: 15 mg/dL (ref 6–20)
CO2: 21 mmol/L — ABNORMAL LOW (ref 22–32)
Calcium: 9.1 mg/dL (ref 8.9–10.3)
Chloride: 109 mmol/L (ref 98–111)
Creatinine, Ser: 0.93 mg/dL (ref 0.44–1.00)
GFR, Estimated: >60 mL/min (ref >60)
Glucose, Bld: 91 mg/dL (ref 70–99)
Potassium: 3.9 mmol/L (ref 3.5–5.1)
Sodium: 138 mmol/L (ref 135–145)

## 2023-11-21 LAB — HEMOGLOBIN A1C
Hgb A1c MFr Bld: 5.8 % — ABNORMAL HIGH (ref 4.8–5.6)
Mean Plasma Glucose: 119.76 mg/dL

## 2023-11-21 LAB — BRAIN NATRIURETIC PEPTIDE: B Natriuretic Peptide: 480.5 pg/mL — ABNORMAL HIGH (ref 0.0–100.0)

## 2023-11-21 MED ORDER — LOSARTAN POTASSIUM 25 MG PO TABS
25.0000 mg | ORAL_TABLET | Freq: Every day | ORAL | 3 refills | Status: DC
Start: 2023-11-21 — End: 2024-07-07
  Filled 2023-11-21 – 2024-04-01 (×2): qty 30, 30d supply, fill #0
  Filled 2024-05-11: qty 30, 30d supply, fill #1

## 2023-11-21 MED ORDER — FUROSEMIDE 40 MG PO TABS
40.0000 mg | ORAL_TABLET | Freq: Two times a day (BID) | ORAL | 3 refills | Status: DC
Start: 2023-11-21 — End: 2024-01-31
  Filled 2023-11-21: qty 60, 30d supply, fill #0

## 2023-11-21 NOTE — Progress Notes (Signed)
ReDS Vest / Clip - 11/21/23 1610       ReDS Vest / Clip   Station Marker B    Ruler Value 38    ReDS Value Range High volume overload    ReDS Actual Value 44

## 2023-11-21 NOTE — Patient Instructions (Signed)
Increase Lasix to 40 mg twice daily - updated Rx sent to local pharmacy. Increase Losartan to 25 mg daily - updated Rx sent to local pharmacy. Labs today - will call you if abbnormal. Repeat labs in 10 days - see below. Referral sent to PharmD Clinic for Ozempic - see below. Return to Heart Failure APP Clinic in 4 - 6 weeks. See below. Please call us at [phone] if any questions or issues prior to your next appointment.

## 2023-11-29 ENCOUNTER — Other Ambulatory Visit (HOSPITAL_COMMUNITY): Payer: No Typology Code available for payment source

## 2023-12-03 ENCOUNTER — Other Ambulatory Visit (HOSPITAL_COMMUNITY): Payer: Self-pay

## 2023-12-06 ENCOUNTER — Other Ambulatory Visit (HOSPITAL_COMMUNITY): Payer: No Typology Code available for payment source

## 2024-01-02 ENCOUNTER — Encounter (HOSPITAL_COMMUNITY): Payer: No Typology Code available for payment source

## 2024-01-27 NOTE — Progress Notes (Signed)
 ADVANCED HF CLINIC NOTE  Primary Care: Medina-Vargas, Margit Banda, NP HF Cardiologist: Dr. Gala Romney   HPI: Carolyn Oconnor is a 48 y.o.  AAF w/ h/o asthma, obesity and recently diagnosed systolic heart failure.   Admitted 4/30- 03/04/23 with acute systolic HF. Presented to the ED w/ several weeks progressive SOB, orthopnea and b/l LE. Also felt to have component of acute asthma exacerbation. Diuresed with IV Lasix, started prednisone and bronchodilators. 2D echo was completed and showed severely reduced LVEF 20-25%, mild- mod MR, RV hyperdynamic. L/RHC: normal coronaries, marked volume overload with persevered CO. cMRI 5/24: LVEF 25%, RVEF 42%. Non ischemic cardiomyopathy. GDMT started. Discharge weight 248 lbs.   Today she returns for HF follow up. Overall feeling poorly.  She has SOB walking short distances on flat ground, using her inhaler. She attributes pollen/allergies to her breathing difficulty. Ankles are swelling. Initially urinated briskly on double dose of Lasix. Denies palpitations, abnormal bleeding, CP, dizziness, or PND/Orthopnea. She has early satiety. No fever or chills. Weight at home 241 pounds. Taking all medications. No tobacco/ETOH/drugs. 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   Cardiac studies: - Echo 08/22/23 EF ~30-35% RV ok (limited images)   - L/RHC 5/24: Normal cors. RA 13, PA 59/42 (48), PCWP 33, CO/CI (Fick) 6.2/2.9, PVR 2.4 WU, PAPi 1.3  - cMRI 5/24: LVEF 25%, RVEF 42%. Non ischemic cardiomyopathy.  - Echo 4/24: LVEF 20-25%, mild- mod MR, RV hyperdynamic.   Past Medical History:  Diagnosis Date   Allergic rhinitis    Asthma    Bronchitis, chronic (HCC)    CHF (congestive heart failure) (HCC)    Pneumonia     Current Outpatient Medications  Medication Sig Dispense Refill   acetaminophen (TYLENOL) 325 MG tablet Take 2 tablets (650 mg total) by mouth every 6 (six) hours as needed for mild pain (or Fever >/= 101). 20 tablet  0   albuterol (PROVENTIL HFA;VENTOLIN HFA) 108 (90 Base) MCG/ACT inhaler Inhale 2 puffs into the lungs every 4 (four) hours as needed (cough and wheeze). For asthma attacks. 1 Inhaler 3   benzonatate (TESSALON) 100 MG capsule Take 1 capsule (100 mg total) by mouth every 8 (eight) hours. 21 capsule 0   bisoprolol (ZEBETA) 5 MG tablet Take 1/2 tablet (2.5 mg total) by mouth 2 (two) times daily. 30 tablet 6   budesonide-formoterol (SYMBICORT) 80-4.5 MCG/ACT inhaler Inhale 2 puffs into the lungs 2 (two) times daily as needed.     dapagliflozin propanediol (FARXIGA) 10 MG TABS tablet Take 1 tablet (10 mg total) by mouth daily before breakfast. 30 tablet 6   digoxin (LANOXIN) 0.125 MG tablet Take 1 tablet (0.125 mg total) by mouth daily. 30 tablet 3   fluticasone (FLONASE) 50 MCG/ACT nasal spray Place 2 sprays into both nostrils daily as needed for allergies or rhinitis.     furosemide (LASIX) 40 MG tablet Take 1 tablet (40 mg total) by mouth 2 (two) times daily. 180 tablet 3   ivabradine (CORLANOR) 5 MG TABS tablet Take 1/2 tablet (2.5 mg total) by mouth 2 (two) times daily with a meal. 60 tablet 3   losartan (COZAAR) 25 MG tablet Take 1 tablet (25 mg total) by mouth daily. 90 tablet 3   montelukast (SINGULAIR) 10 MG tablet Take 1 tablet (10 mg total) by mouth at bedtime. 30 tablet 3   spironolactone (ALDACTONE) 25 MG tablet Take 1 tablet (25 mg total) by mouth daily. 30 tablet  3   ondansetron (ZOFRAN-ODT) 4 MG disintegrating tablet Take 1 tablet (4 mg total) by mouth every 8 (eight) hours as needed for nausea. (Patient not taking: Reported on 01/31/2024) 10 tablet 0   No current facility-administered medications for this encounter.    Allergies  Allergen Reactions   Aspirin     Patient states that it makes her go into an asthma attack.       Social History   Socioeconomic History   Marital status: Significant Other    Spouse name: Not on file   Number of children: 0   Years of education: Not  on file   Highest education level: High school graduate  Occupational History   Occupation: [employer]  Tobacco Use   Smoking status: Never    Passive exposure: Yes   Smokeless tobacco: Never   Tobacco comments:    Secondhand from Father  Vaping Use   Vaping status: Never Used  Substance and Sexual Activity   Alcohol use: No    Alcohol/week: 0.0 standard drinks of alcohol   Drug use: No    Frequency: 1.0 times per week   Sexual activity: Not on file  Other Topics Concern   Not on file  Social History Narrative   Single, lives with boyfriend   CNA, Editor, commissioning   No recent travel      Adult nurse Pulmonary:   Originally from [place]. Always lived in [place]. Previously hasn't traveled outside of the state. 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 No pets currently. She does have carpet & draperies in her home as well as the bedroom. No indoor plants. No bird exposure. No mold exposure. Enjoys drawing & reading.       Diet: Left Blank      Caffeine: Yes      Married, Single if yes what year:       Do you live in a house, apartment, assisted living, condo, trailer, ect: apartment      Is it one or more stories: Left Blank      How many persons live in your home? 2      Pets: no      Highest level or education completed: [employer]      Current/Past profession: Computer      Exercise:  Yes                Type and how often: 2 time a day         Living Will: Yes   DNR: Yes   POA/HPOA: Yes      Functional Status:   Do you have difficulty bathing or dressing yourself? No   Do you have difficulty preparing food or eating? No   Do you have difficulty managing your medications? No   Do you have difficulty managing your finances? No   Do you have difficulty affording your medications? No   Social Drivers of Corporate investment banker Strain: High Risk (03/12/2023)   Overall Financial Resource Strain (CARDIA)    Difficulty  of Paying Living Expenses: Very hard  Food Insecurity: No Food Insecurity (02/26/2023)   Hunger Vital Sign    Worried About Running Out of Food in the Last Year: Never true    Ran Out of Food in the Last Year: Never true  Transportation Needs: No Transportation Needs (02/27/2023)   PRAPARE - Administrator, Civil Service (Medical): No  Lack of Transportation (Non-Medical): No  Physical Activity: Not on file  Stress: Not on file  Social Connections: Not on file  Intimate Partner Violence: Not At Risk (02/26/2023)   Humiliation, Afraid, Rape, and Kick questionnaire    Fear of Current or Ex-Partner: No    Emotionally Abused: No    Physically Abused: No    Sexually Abused: No     Family History  Problem Relation Age of Onset   Allergies Mother    Heart disease Mother    Allergies Father    Asthma Father    Diabetes Sister    Asthma Sister    Allergies Sister    Cancer Sister    Asthma Brother    Allergies Brother    Heart disease Maternal Aunt    Breast cancer Maternal Aunt     BP 120/88   Pulse 94   Wt 115.8 kg (255 lb 6.4 oz)   SpO2 97%   BMI 46.71 kg/m   Wt Readings from Last 3 Encounters:  01/31/24 115.8 kg (255 lb 6.4 oz)  01/29/24 114.9 kg (253 lb 3.2 oz)  11/21/23 112.5 kg (248 lb)   PHYSICAL EXAM: General:  NAD. No resp difficulty HEENT: Normal Neck: Supple. JVP 8-10 Cor: Regular rate & rhythm. No rubs, gallops or murmurs. Lungs: Clear Abdomen: Soft, obese, nontender, nondistended.  Extremities: No cyanosis, clubbing, rash,2+ BLE  edema Neuro: Alert & oriented x 3, moves all 4 extremities w/o difficulty. Affect pleasant.  ReDs reading:  45%, abnormal/high  ASSESSMENT & PLAN: 1. Chronic Systolic Heart Failure, Nonischemic CM - Diagnosed 4/24 - Echo EF 20-25% (looks closer to 30-35%), RV hyperdynamic  - R/LHC 5/3: no CAD, RA 13, PA 59/42 (48), PCW 33, LV EDP 35. Fick CO 6.13, CI 2.87. Thermo CO/CI: 6.3/2.9. PAPi 1.3 - cMRI 5/24: LVEF 25%,  RVEF 42%. Non ischemic cardiomyopathy.  - Echo 08/22/23 EF 30-35% RV ok (limited images)  - Etiology uncertain. - NYHA III, volume up on exam, weight up and ReDS high at 45% - Stop Lasix - Start torsemide 60 mg daily, add 20 KCL daily. - Continue spiro 25 mg daily  - Continue losartan 25 mg daily. Consider switch to [facility] next visit. - Continue digoxin 0.125 mg daily  - Continue Corlanor 2.5 mg bid. - Continue bisoprolol 2.5 mg bid. - Continue Farxiga 10. Denies GU symptoms  - EF improving. No ICD yet - Labs today.   2. Morbid obesity - Body mass index is Body mass index is 46.71 kg/m. - Has seen PharmD for GLP1RA. A1C 5.8.  - Planning on starting [facility] soon   3. H/o NSVT - Continue GDMT - Consider sleep study next visit  Follow up in 3 weeks with APP to reassess volume. Will need repeat echo down the road.  Jacklynn Ganong, FNP  12:03 PM

## 2024-01-28 ENCOUNTER — Telehealth: Payer: Self-pay | Admitting: Pharmacist

## 2024-01-28 NOTE — Progress Notes (Unsigned)
 Patient ID: Carolyn Oconnor                 DOB: 12-27-1975                    MRN: [id number]     HPI: Carolyn Oconnor is a 48 y.o. female patient referred to pharmacy clinic by Brynda Peon to initiate GLP1-RA therapy. PMH is significant for HF , and obesity. Most recent BMI 46.3 kg/m .  Baseline weight and BMI: 248 lbs 46.88 kg/m  Current weight and BMI: 253 lbs 46.3 kg/m  Current meds that affect weight: Farxiga and furosemide  Recently gain water weight. Will take furosemide when she return home today. Was hesitant for sleep study as she thought she has to go somewhere for sleep study. Made patient aware there is home sleep study. Want to get referred to that  Diet:  Eats fruits  Does not eat much but soda and sweet drinks are her weakness  Drink: sprites 20 oz 2 bottles  Exercise: none due to SOB   Family History:  Relation Problem Comments  Mother - Kaicee Scarpino (Deceased at age 33) Allergies   Heart disease     Father - Storm Frisk [facility], age 54y) Allergies   Asthma     Sister - Neshia Mckenzie (Deceased at age 58) Allergies   Asthma   Diabetes     Sister - Stachia Slutsky (Alive)   Sister - Octaviano Glow (Alive)   Sister - Mallory Shirk (Alive)   Sister - Leretha Dykes (Deceased at age 16) Cancer     Brother - Drue Flirt (Alive) Allergies   Asthma     Brother - Shoard Delbene (Alive)   Brother - Gasper Lloyd. Wiliams (Alive)   Maternal Aunt Breast cancer   Heart disease      Social History:  Alcohol: none  Smoking: none  Labs: Lab Results  Component Value Date   HGBA1C 5.8 (H) 11/21/2023    Wt Readings from Last 1 Encounters:  11/21/23 248 lb (112.5 kg)    BP Readings from Last 1 Encounters:  11/21/23 (!) 122/90   Pulse Readings from Last 1 Encounters:  11/21/23 88       Component Value Date/Time   CHOL 184 04/12/2023 0958   TRIG 104 04/12/2023 0958   HDL 35 (L) 04/12/2023 0958   CHOLHDL 5.3 (H) 04/12/2023 0958   LDLCALC 128 (H) 04/12/2023 0958     Past Medical History:  Diagnosis Date   Allergic rhinitis    Asthma    Bronchitis, chronic (HCC)    CHF (congestive heart failure) (HCC)    Pneumonia     Current Outpatient Medications on File Prior to Visit  Medication Sig Dispense Refill   acetaminophen (TYLENOL) 325 MG tablet Take 2 tablets (650 mg total) by mouth every 6 (six) hours as needed for mild pain (or Fever >/= 101). 20 tablet 0   albuterol (PROVENTIL HFA;VENTOLIN HFA) 108 (90 Base) MCG/ACT inhaler Inhale 2 puffs into the lungs every 4 (four) hours as needed (cough and wheeze). For asthma attacks. 1 Inhaler 3   benzonatate (TESSALON) 100 MG capsule Take 1 capsule (100 mg total) by mouth every 8 (eight) hours. 21 capsule 0   bisoprolol (ZEBETA) 5 MG tablet Take 1/2 tablet (2.5 mg total) by mouth 2 (two) times daily. 30 tablet 6   budesonide-formoterol (SYMBICORT) 80-4.5 MCG/ACT inhaler Inhale 2 puffs into the lungs 2 (two) times daily as needed.  dapagliflozin propanediol (FARXIGA) 10 MG TABS tablet Take 1 tablet (10 mg total) by mouth daily before breakfast. 30 tablet 6   digoxin (LANOXIN) 0.125 MG tablet Take 1 tablet (0.125 mg total) by mouth daily. 30 tablet 3   fluticasone (FLONASE) 50 MCG/ACT nasal spray Place 2 sprays into both nostrils daily as needed for allergies or rhinitis.     furosemide (LASIX) 40 MG tablet Take 1 tablet (40 mg total) by mouth 2 (two) times daily. 180 tablet 3   ivabradine (CORLANOR) 5 MG TABS tablet Take 1/2 tablet (2.5 mg total) by mouth 2 (two) times daily with a meal. 60 tablet 3   losartan (COZAAR) 25 MG tablet Take 1 tablet (25 mg total) by mouth daily. 90 tablet 3   montelukast (SINGULAIR) 10 MG tablet Take 1 tablet (10 mg total) by mouth at bedtime. 30 tablet 3   ondansetron (ZOFRAN-ODT) 4 MG disintegrating tablet Take 1 tablet (4 mg total) by mouth every 8 (eight) hours as needed for nausea. (Patient not taking: Reported on 11/21/2023) 10 tablet 0   predniSONE (DELTASONE) 10 MG  tablet Take 6 tablets by mouth on day 1, 5 tablets on day 2, 4 tablets on day 3, 3 tablets on day 4, 2 tablets on day 5, 1 tablet on day 6 and stop on day 7 (Patient not taking: Reported on 11/21/2023) 21 tablet 0   spironolactone (ALDACTONE) 25 MG tablet Take 1 tablet (25 mg total) by mouth daily. 30 tablet 3   No current facility-administered medications on file prior to visit.    Allergies  Allergen Reactions   Aspirin     Patient states that it makes her go into an asthma attack.      Assessment/Plan:  1. Weight loss - Patient has not met goal of at least 5% of body weight loss with comprehensive lifestyle modifications alone in the past 3-6 months. Pharmacotherapy is appropriate to pursue as augmentation. Will start coverage assessment for [facility] . Confirmed patient not pregnant and no personal or family history of medullary thyroid carcinoma (MTC) or Multiple Endocrine Neoplasia syndrome type 2 (MEN 2). Injection technique reviewed at today's visit.  Advised patient on common side effects including nausea, diarrhea, dyspepsia, decreased appetite, and fatigue. Counseled patient on reducing meal size and how to titrate medication to minimize side effects. Counseled patient to call if intolerable side effects or if experiencing dehydration, abdominal pain, or dizziness. Patient will adhere to dietary modifications and will target at least 150 minutes of moderate intensity exercise weekly.   Follow up in 1 month via telephone for tolerability update and dose titration.   Carmela Hurt, Pharm.D [facility] of [facility] [address] [place], [place] [address]  Phone: [phone]; Fax: [phone]

## 2024-01-29 ENCOUNTER — Encounter: Payer: Self-pay | Admitting: Pharmacist

## 2024-01-29 ENCOUNTER — Ambulatory Visit: Payer: PRIVATE HEALTH INSURANCE | Attending: Student | Admitting: Pharmacist

## 2024-01-29 ENCOUNTER — Telehealth: Payer: Self-pay | Admitting: Pharmacy Technician

## 2024-01-29 ENCOUNTER — Other Ambulatory Visit (HOSPITAL_COMMUNITY): Payer: Self-pay

## 2024-01-29 VITALS — Ht 62.0 in | Wt 253.2 lb

## 2024-01-29 NOTE — Telephone Encounter (Signed)
 Pharmacy Patient Advocate Encounter   Received notification from Pt Calls Messages that prior authorization for [facility] is required/requested.   Insurance verification completed.   The patient is insured through  [employer]  .   Per test claim: PA required; PA submitted to above mentioned insurance via CoverMyMeds Key/confirmation #/EOC  [id number] Status is pending

## 2024-01-30 ENCOUNTER — Telehealth (HOSPITAL_COMMUNITY): Payer: Self-pay

## 2024-01-30 NOTE — Telephone Encounter (Signed)
 Called to confirm/remind patient of their appointment at the [facility] on 01/31/24.   Appointment:   [] Confirmed  [x] Left mess   [] No answer/No voice mail  [] Phone not in service  And to bring in all medications and/or complete list.

## 2024-01-30 NOTE — Telephone Encounter (Signed)
 PA is still pending.

## 2024-01-31 ENCOUNTER — Ambulatory Visit (HOSPITAL_COMMUNITY)
Admission: RE | Admit: 2024-01-31 | Discharge: 2024-01-31 | Disposition: A | Discharge status: Home / Self Care | Payer: PRIVATE HEALTH INSURANCE | Source: Ambulatory Visit | Attending: Family Medicine | Admitting: Family Medicine

## 2024-01-31 ENCOUNTER — Other Ambulatory Visit (HOSPITAL_COMMUNITY): Payer: Self-pay

## 2024-01-31 ENCOUNTER — Telehealth (HOSPITAL_COMMUNITY): Payer: Self-pay | Admitting: *Deleted

## 2024-01-31 ENCOUNTER — Encounter (HOSPITAL_COMMUNITY): Payer: Self-pay

## 2024-01-31 VITALS — BP 120/88 | HR 94 | Wt 255.4 lb

## 2024-01-31 DIAGNOSIS — J45909 Unspecified asthma, uncomplicated: Secondary | ICD-10-CM | POA: Insufficient documentation

## 2024-01-31 DIAGNOSIS — I4729 Other ventricular tachycardia: Secondary | ICD-10-CM | POA: Insufficient documentation

## 2024-01-31 DIAGNOSIS — I5021 Acute systolic (congestive) heart failure: Secondary | ICD-10-CM

## 2024-01-31 DIAGNOSIS — I5022 Chronic systolic (congestive) heart failure: Secondary | ICD-10-CM | POA: Diagnosis not present

## 2024-01-31 DIAGNOSIS — R0602 Shortness of breath: Secondary | ICD-10-CM | POA: Diagnosis not present

## 2024-01-31 DIAGNOSIS — I428 Other cardiomyopathies: Secondary | ICD-10-CM | POA: Diagnosis not present

## 2024-01-31 DIAGNOSIS — Z79899 Other long term (current) drug therapy: Secondary | ICD-10-CM | POA: Insufficient documentation

## 2024-01-31 DIAGNOSIS — Z6841 Body Mass Index (BMI) 40.0 and over, adult: Secondary | ICD-10-CM | POA: Insufficient documentation

## 2024-01-31 LAB — BASIC METABOLIC PANEL WITH GFR
Anion gap: 11 (ref 5–15)
BUN: 15 mg/dL (ref 6–20)
CO2: 19 mmol/L — ABNORMAL LOW (ref 22–32)
Calcium: 8.8 mg/dL — ABNORMAL LOW (ref 8.9–10.3)
Chloride: 108 mmol/L (ref 98–111)
Creatinine, Ser: 0.84 mg/dL (ref 0.44–1.00)
GFR, Estimated: >60 mL/min (ref >60)
Glucose, Bld: 90 mg/dL (ref 70–99)
Potassium: 3.7 mmol/L (ref 3.5–5.1)
Sodium: 138 mmol/L (ref 135–145)

## 2024-01-31 LAB — BRAIN NATRIURETIC PEPTIDE: B Natriuretic Peptide: 819.2 pg/mL — ABNORMAL HIGH (ref 0.0–100.0)

## 2024-01-31 LAB — DIGOXIN LEVEL: Digoxin Level: <0.2 ng/mL — ABNORMAL LOW (ref 0.8–2.0)

## 2024-01-31 MED ORDER — POTASSIUM CHLORIDE CRYS ER 20 MEQ PO TBCR
20.0000 meq | EXTENDED_RELEASE_TABLET | Freq: Every day | ORAL | 3 refills | Status: DC
Start: 2024-01-31 — End: 2024-01-31
  Filled 2024-01-31: qty 30, 30d supply, fill #0

## 2024-01-31 MED ORDER — POTASSIUM CHLORIDE CRYS ER 20 MEQ PO TBCR
40.0000 meq | EXTENDED_RELEASE_TABLET | Freq: Every day | ORAL | 3 refills | Status: AC
Start: 2024-01-31 — End: ?
  Filled 2024-01-31: qty 180, 90d supply, fill #0
  Filled 2024-04-01 – 2024-08-31 (×2): qty 60, 30d supply, fill #0
  Filled 2024-10-26: qty 60, 30d supply, fill #1

## 2024-01-31 MED ORDER — TORSEMIDE 20 MG PO TABS
60.0000 mg | ORAL_TABLET | Freq: Every day | ORAL | 11 refills | Status: DC
Start: 2024-01-31 — End: 2024-07-07
  Filled 2024-01-31: qty 90, 30d supply, fill #0
  Filled 2024-04-01: qty 90, 30d supply, fill #1
  Filled 2024-06-17: qty 90, 30d supply, fill #2

## 2024-01-31 NOTE — Patient Instructions (Signed)
 Stop Lasix. Start torsemide 60 mg (3 tablets) daily - Rx sent. Start potassium 20 meq daily - Rx sent. Labs today - will call you if abnormal. Return to Heart Failure APP Clinic in 3 weeks - see below. Please call us at [phone] if any questions or concerns prior to your next visit.

## 2024-01-31 NOTE — Telephone Encounter (Signed)
 Called patient per Carolyn Rome, NP with following lab results and instructions:  "Labs stable, bnp up. Diuretics adjusted at visit today   Please change daily KCL to 40 (not 20) as discussed at visit."  Pt verbalized understanding of same. Rx updated.

## 2024-01-31 NOTE — Addendum Note (Signed)
 Encounter addended by: Levonne Spiller, RN on: 01/31/2024 1:00 PM  Actions taken: Charge Capture section accepted

## 2024-01-31 NOTE — Progress Notes (Signed)
 ReDS Vest / Clip - 01/31/24 1200       ReDS Vest / Clip   Station Marker B    Ruler Value 34    ReDS Value Range High volume overload    ReDS Actual Value 45

## 2024-02-03 ENCOUNTER — Other Ambulatory Visit (HOSPITAL_COMMUNITY): Payer: Self-pay

## 2024-02-04 ENCOUNTER — Encounter (HOSPITAL_COMMUNITY): Payer: PRIVATE HEALTH INSURANCE

## 2024-02-11 ENCOUNTER — Other Ambulatory Visit (HOSPITAL_COMMUNITY): Payer: Self-pay

## 2024-02-12 ENCOUNTER — Telehealth (HOSPITAL_COMMUNITY): Payer: Self-pay | Admitting: Cardiology

## 2024-02-12 ENCOUNTER — Other Ambulatory Visit (HOSPITAL_COMMUNITY): Payer: Self-pay

## 2024-02-12 DIAGNOSIS — I5022 Chronic systolic (congestive) heart failure: Secondary | ICD-10-CM

## 2024-02-12 NOTE — Telephone Encounter (Signed)
-----   Message from CMA Philicia B sent at 02/11/2024 11:05 AM EDT ----- I'm unsure about patients insurance;I would have patient do split night ----- Message ----- From: Edris Gowers, CMA Sent: 02/04/2024   8:54 AM EDT To: Sharrell Deck, CMA  Precert itamar? ----- Message ----- From: Sheryl Donna, NP Sent: 01/31/2024   7:45 AM EDT To: Edris Gowers, CMA; Emer Blanca Bunch, RN; #  Absolutely. Please order sleep study. Itamar vs split night (which ever insurance covers). Thank you. ----- Message ----- From: Cathalene Clipper, [facility] Sent: 01/29/2024   4:19 PM EDT To: Mardell Shade, MD; Sheryl Donna, NP; #  Patient is ready for home sleep study can we order that for this patient

## 2024-02-12 NOTE — Telephone Encounter (Signed)
Order placed for split night study. 

## 2024-02-12 NOTE — Telephone Encounter (Signed)
 Pharmacy Patient Advocate Encounter  Received notification from  [employer]  that Prior Authorization for [employer] has been DENIED.  Full denial letter will be uploaded to the media tab. See denial reason below.

## 2024-02-13 NOTE — Telephone Encounter (Signed)
 Call to inform about denial. N/A LVM per [facility]

## 2024-02-28 ENCOUNTER — Encounter (HOSPITAL_COMMUNITY): Payer: PRIVATE HEALTH INSURANCE

## 2024-03-11 ENCOUNTER — Other Ambulatory Visit (HOSPITAL_COMMUNITY): Payer: Self-pay | Admitting: Cardiology

## 2024-03-11 DIAGNOSIS — G4733 Obstructive sleep apnea (adult) (pediatric): Secondary | ICD-10-CM

## 2024-04-01 ENCOUNTER — Other Ambulatory Visit (HOSPITAL_COMMUNITY): Payer: Self-pay

## 2024-04-01 ENCOUNTER — Other Ambulatory Visit: Payer: Self-pay

## 2024-04-23 ENCOUNTER — Ambulatory Visit (HOSPITAL_BASED_OUTPATIENT_CLINIC_OR_DEPARTMENT_OTHER): Payer: PRIVATE HEALTH INSURANCE | Attending: Internal Medicine | Admitting: Cardiology

## 2024-05-13 ENCOUNTER — Other Ambulatory Visit (HOSPITAL_COMMUNITY): Payer: Self-pay

## 2024-05-27 NOTE — Progress Notes (Incomplete)
 ADVANCED HF CLINIC NOTE  Primary Care: Medina-Vargas, Jereld BROCKS, NP HF Cardiologist: Dr. Cherrie   HPI: Ms. Lietzke is a 48 y.o.  AAF w/ h/o asthma, obesity and recently diagnosed systolic heart failure.   Admitted 4/30- 03/04/23 with acute systolic HF. Presented to the ED w/ several weeks progressive SOB, orthopnea and b/l LE. Also felt to have component of acute asthma exacerbation. Diuresed with IV Lasix , started prednisone  and bronchodilators. 2D echo was completed and showed severely reduced LVEF 20-25%, mild- mod MR, RV hyperdynamic. L/RHC: normal coronaries, marked volume overload with persevered CO. cMRI 5/24: LVEF 25%, RVEF 42%. Non ischemic cardiomyopathy. GDMT started. Discharge weight 248 lbs.   Today she returns for HF follow up. Overall feeling poorly.  She has SOB walking short distances on flat ground, using her inhaler. She attributes pollen/allergies to her breathing difficulty. Ankles are swelling. Initially urinated briskly on double dose of Lasix . Denies palpitations, abnormal bleeding, CP, dizziness, or PND/Orthopnea. She has early satiety. No fever or chills. Weight at home 241 pounds. Taking all medications. No tobacco/ETOH/drugs. 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   Cardiac studies: - Echo 08/22/23 EF ~30-35% RV ok (limited images)   - L/RHC 5/24: Normal cors. RA 13, PA 59/42 (48), PCWP 33, CO/CI (Fick) 6.2/2.9, PVR 2.4 WU, PAPi 1.3  - cMRI 5/24: LVEF 25%, RVEF 42%. Non ischemic cardiomyopathy.  - Echo 4/24: LVEF 20-25%, mild- mod MR, RV hyperdynamic.   Past Medical History:  Diagnosis Date   Allergic rhinitis    Asthma    Bronchitis, chronic (HCC)    CHF (congestive heart failure) (HCC)    Pneumonia     Current Outpatient Medications  Medication Sig Dispense Refill   acetaminophen  (TYLENOL ) 325 MG tablet Take 2 tablets (650 mg total) by mouth every 6 (six) hours as needed for mild pain (or Fever >/= 101). 20 tablet  0   albuterol  (PROVENTIL  HFA;VENTOLIN  HFA) 108 (90 Base) MCG/ACT inhaler Inhale 2 puffs into the lungs every 4 (four) hours as needed (cough and wheeze). For asthma attacks. 1 Inhaler 3   benzonatate  (TESSALON ) 100 MG capsule Take 1 capsule (100 mg total) by mouth every 8 (eight) hours. 21 capsule 0   bisoprolol  (ZEBETA ) 5 MG tablet Take 1/2 tablet (2.5 mg total) by mouth 2 (two) times daily. 30 tablet 6   budesonide -formoterol  (SYMBICORT ) 80-4.5 MCG/ACT inhaler Inhale 2 puffs into the lungs 2 (two) times daily as needed.     dapagliflozin  propanediol (FARXIGA ) 10 MG TABS tablet Take 1 tablet (10 mg total) by mouth daily before breakfast. 30 tablet 6   digoxin  (LANOXIN ) 0.125 MG tablet Take 1 tablet (0.125 mg total) by mouth daily. 30 tablet 3   fluticasone  (FLONASE ) 50 MCG/ACT nasal spray Place 2 sprays into both nostrils daily as needed for allergies or rhinitis.     ivabradine  (CORLANOR ) 5 MG TABS tablet Take 1/2 tablet (2.5 mg total) by mouth 2 (two) times daily with a meal. 60 tablet 3   losartan  (COZAAR ) 25 MG tablet Take 1 tablet (25 mg total) by mouth daily. 90 tablet 3   montelukast  (SINGULAIR ) 10 MG tablet Take 1 tablet (10 mg total) by mouth at bedtime. 30 tablet 3   ondansetron  (ZOFRAN -ODT) 4 MG disintegrating tablet Take 1 tablet (4 mg total) by mouth every 8 (eight) hours as needed for nausea. (Patient not taking: Reported on 01/31/2024) 10 tablet 0   potassium chloride  SA (KLOR-CON  M)  20 MEQ tablet Take 2 tablets (40 mEq total) by mouth daily. 180 tablet 3   spironolactone  (ALDACTONE ) 25 MG tablet Take 1 tablet (25 mg total) by mouth daily. 30 tablet 3   torsemide  (DEMADEX ) 20 MG tablet Take 3 tablets (60 mg total) by mouth daily. 90 tablet 11   No current facility-administered medications for this visit.    Allergies  Allergen Reactions   Aspirin      Patient states that it makes her go into an asthma attack.       Social History   Socioeconomic History   Marital status:  Significant Other    Spouse name: Not on file   Number of children: 0   Years of education: Not on file   Highest education level: High school graduate  Occupational History   Occupation: [employer]  Tobacco Use   Smoking status: Never    Passive exposure: Yes   Smokeless tobacco: Never   Tobacco comments:    Secondhand from Father  Vaping Use   Vaping status: Never Used  Substance and Sexual Activity   Alcohol use: No    Alcohol/week: 0.0 standard drinks of alcohol   Drug use: No    Frequency: 1.0 times per week   Sexual activity: Not on file  Other Topics Concern   Not on file  Social History Narrative   Single, lives with boyfriend   CNA, Editor, commissioning   No recent travel      Adult nurse Pulmonary:   Originally from [place]. Always lived in [place]. Previously hasn't traveled outside of the state. 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 No pets currently. She does have carpet & draperies in her home as well as the bedroom. No indoor plants. No bird exposure. No mold exposure. Enjoys drawing & reading.       Diet: Left Blank      Caffeine: Yes      Married, Single if yes what year:       Do you live in a house, apartment, assisted living, condo, trailer, ect: apartment      Is it one or more stories: Left Blank      How many persons live in your home? 2      Pets: no      Highest level or education completed: [employer]      Current/Past profession: Computer      Exercise:  Yes                Type and how often: 2 time a day         Living Will: Yes   DNR: Yes   POA/HPOA: Yes      Functional Status:   Do you have difficulty bathing or dressing yourself? No   Do you have difficulty preparing food or eating? No   Do you have difficulty managing your medications? No   Do you have difficulty managing your finances? No   Do you have difficulty affording your medications? No   Social Drivers of Research scientist (physical sciences) Strain: High Risk (03/12/2023)   Overall Financial Resource Strain (CARDIA)    Difficulty of Paying Living Expenses: Very hard  Food Insecurity: No Food Insecurity (02/26/2023)   Hunger Vital Sign    Worried About Running Out of Food in the Last Year: Never true    Ran Out of Food in the Last Year: Never true  Transportation Needs: No Transportation Needs (02/27/2023)   PRAPARE - Administrator, Civil Service (Medical): No    Lack of Transportation (Non-Medical): No  Physical Activity: Not on file  Stress: Not on file  Social Connections: Not on file  Intimate Partner Violence: Not At Risk (02/26/2023)   Humiliation, Afraid, Rape, and Kick questionnaire    Fear of Current or Ex-Partner: No    Emotionally Abused: No    Physically Abused: No    Sexually Abused: No     Family History  Problem Relation Age of Onset   Allergies Mother    Heart disease Mother    Allergies Father    Asthma Father    Diabetes Sister    Asthma Sister    Allergies Sister    Cancer Sister    Asthma Brother    Allergies Brother    Heart disease Maternal Aunt    Breast cancer Maternal Aunt     There were no vitals taken for this visit.  Wt Readings from Last 3 Encounters:  01/31/24 115.8 kg (255 lb 6.4 oz)  01/29/24 114.9 kg (253 lb 3.2 oz)  11/21/23 112.5 kg (248 lb)   PHYSICAL EXAM: General:  NAD. No resp difficulty HEENT: Normal Neck: Supple. JVP 8-10 Cor: Regular rate & rhythm. No rubs, gallops or murmurs. Lungs: Clear Abdomen: Soft, obese, nontender, nondistended.  Extremities: No cyanosis, clubbing, rash,2+ BLE  edema Neuro: Alert & oriented x 3, moves all 4 extremities w/o difficulty. Affect pleasant.  ReDs reading:  45%, abnormal/high  ASSESSMENT & PLAN: 1. Chronic Systolic Heart Failure, Nonischemic CM - Diagnosed 4/24 - Echo EF 20-25% (looks closer to 30-35%), RV hyperdynamic  - R/LHC 5/3: no CAD, RA 13, PA 59/42 (48), PCW 33, LV EDP 35. Fick CO 6.13, CI 2.87.  Thermo CO/CI: 6.3/2.9. PAPi 1.3 - cMRI 5/24: LVEF 25%, RVEF 42%. Non ischemic cardiomyopathy.  - Echo 08/22/23 EF 30-35% RV ok (limited images)  - Etiology uncertain. - NYHA III, volume up on exam, weight up and ReDS high at 45% - Stop Lasix  - Start torsemide  60 mg daily, add 20 KCL daily. - Continue spiro 25 mg daily  - Continue losartan  25 mg daily. Consider switch to Entresto next visit. - Continue digoxin  0.125 mg daily  - Continue Corlanor  2.5 mg bid. - Continue bisoprolol  2.5 mg bid. - Continue Farxiga  10. Denies GU symptoms  - EF improving. No ICD yet - Labs today.   2. Morbid obesity - Body mass index is There is no height or weight on file to calculate BMI. - Has seen PharmD for GLP1RA. A1C 5.8.  - Planning on starting [facility] soon   3. H/o NSVT - Continue GDMT - Consider sleep study next visit  Follow up in 3 weeks with APP to reassess volume. Will need repeat echo down the road.  Harlene CHRISTELLA Gainer, FNP  3:54 PM

## 2024-06-02 ENCOUNTER — Encounter (HOSPITAL_COMMUNITY): Payer: PRIVATE HEALTH INSURANCE

## 2024-06-10 ENCOUNTER — Other Ambulatory Visit (HOSPITAL_COMMUNITY): Payer: Self-pay

## 2024-06-10 ENCOUNTER — Ambulatory Visit (INDEPENDENT_AMBULATORY_CARE_PROVIDER_SITE_OTHER): Admitting: Adult Health

## 2024-06-10 VITALS — BP 134/68 | HR 94 | Temp 97.2°F | Resp 20 | Ht 62.0 in | Wt 258.8 lb

## 2024-06-10 DIAGNOSIS — J45909 Unspecified asthma, uncomplicated: Secondary | ICD-10-CM | POA: Diagnosis not present

## 2024-06-10 DIAGNOSIS — R059 Cough, unspecified: Secondary | ICD-10-CM | POA: Diagnosis not present

## 2024-06-10 DIAGNOSIS — I5022 Chronic systolic (congestive) heart failure: Secondary | ICD-10-CM | POA: Diagnosis not present

## 2024-06-10 DIAGNOSIS — R0989 Other specified symptoms and signs involving the circulatory and respiratory systems: Secondary | ICD-10-CM | POA: Diagnosis not present

## 2024-06-10 DIAGNOSIS — I10 Essential (primary) hypertension: Secondary | ICD-10-CM | POA: Diagnosis not present

## 2024-06-10 LAB — POCT INFLUENZA A/B
Influenza A, POC: NEGATIVE
Influenza B, POC: NEGATIVE

## 2024-06-10 LAB — POC COVID19 BINAXNOW: SARS Coronavirus 2 Ag: NEGATIVE

## 2024-06-10 MED ORDER — DOXYCYCLINE HYCLATE 100 MG PO TABS
100.0000 mg | ORAL_TABLET | Freq: Two times a day (BID) | ORAL | 0 refills | Status: AC
  Filled 2024-06-10 (×2): qty 20, 10d supply, fill #0

## 2024-06-10 MED ORDER — GUAIFENESIN ER 600 MG PO TB12: 600.0000 mg | ORAL_TABLET | Freq: Two times a day (BID) | ORAL | Status: AC

## 2024-06-10 NOTE — Progress Notes (Signed)
 [facility] clinic  Provider:  Jereld Serum DNP  Code Status:  Full Code  Goals of Care:     06/28/2023    8:36 AM  Advanced Directives  Does Patient Have a Medical Advance Directive? No  Would patient like information on creating a medical advance directive? No - Patient declined     Chief Complaint  Patient presents with   Nasal Congestion    congestion, coughing     Discussed the use of AI scribe software for clinical note transcription with the patient, who gave verbal consent to proceed.  HPI: Patient is a 48 y.o. female seen today for an acute visit for cough and congestion.  She has been experiencing a cough with yellow sputum production and chest congestion for approximately five days. No fever or chills are present, but she has shortness of breath. She attributes her symptoms to exposure at work, where she is around many sick people.  Her medical history includes asthma and chronic bronchitis. She uses Symbicort two puffs twice a day as needed and albuterol two puffs every four hours as needed. She carries an albuterol inhaler with her and has another at home. She denies smoking.  She is currently taking several medications for systolic congestive heart failure, including Demadex 60 mg daily, Aldactone 25 mg daily, potassium chloride SA 40 mEq daily, Corlanor 2.5 mg twice a day, digoxin 0.125 mg daily, losartan 25 mg daily, and Farxiga 10 mg daily. Her blood pressure is currently 134/68 mmHg, and she wants to ensure it remains stable.  She has not received a flu shot this year. She is currently in nursing school, pursuing an LPN, and is working as a Lawyer. She has no children.   FMLA paper and excuse letter for work completed.   Past Medical History:  Diagnosis Date   Allergic rhinitis    Asthma    Bronchitis, chronic (HCC)    CHF (congestive heart failure) (HCC)    Pneumonia     Past Surgical History:  Procedure Laterality Date   none     RIGHT/LEFT HEART  CATH AND CORONARY ANGIOGRAPHY N/A 03/01/2023   Procedure: RIGHT/LEFT HEART CATH AND CORONARY ANGIOGRAPHY;  Surgeon: Cherrie Toribio SAUNDERS, MD;  Location: [facility];  Service: Cardiovascular;  Laterality: N/A;    Allergies  Allergen Reactions   Aspirin     Patient states that it makes her go into an asthma attack.     Outpatient Encounter Medications as of 06/10/2024  Medication Sig   acetaminophen (TYLENOL) 325 MG tablet Take 2 tablets (650 mg total) by mouth every 6 (six) hours as needed for mild pain (or Fever >/= 101).   albuterol (PROVENTIL HFA;VENTOLIN HFA) 108 (90 Base) MCG/ACT inhaler Inhale 2 puffs into the lungs every 4 (four) hours as needed (cough and wheeze). For asthma attacks.   benzonatate (TESSALON) 100 MG capsule Take 1 capsule (100 mg total) by mouth every 8 (eight) hours.   bisoprolol (ZEBETA) 5 MG tablet Take 1/2 tablet (2.5 mg total) by mouth 2 (two) times daily.   budesonide-formoterol (SYMBICORT) 80-4.5 MCG/ACT inhaler Inhale 2 puffs into the lungs 2 (two) times daily as needed.   dapagliflozin propanediol (FARXIGA) 10 MG TABS tablet Take 1 tablet (10 mg total) by mouth daily before breakfast.   digoxin (LANOXIN) 0.125 MG tablet Take 1 tablet (0.125 mg total) by mouth daily.   doxycycline (VIBRA-TABS) 100 MG tablet Take 1 tablet (100 mg total) by mouth 2 (two) times daily  for 10 days.   fluticasone  (FLONASE ) 50 MCG/ACT nasal spray Place 2 sprays into both nostrils daily as needed for allergies or rhinitis.   guaiFENesin  (MUCINEX ) 600 MG 12 hr tablet Take 1 tablet (600 mg total) by mouth 2 (two) times daily for 14 days.   ivabradine  (CORLANOR ) 5 MG TABS tablet Take 1/2 tablet (2.5 mg total) by mouth 2 (two) times daily with a meal.   losartan  (COZAAR ) 25 MG tablet Take 1 tablet (25 mg total) by mouth daily.   montelukast  (SINGULAIR ) 10 MG tablet Take 1 tablet (10 mg total) by mouth at bedtime.   ondansetron  (ZOFRAN -ODT) 4 MG disintegrating tablet Take 1 tablet (4 mg  total) by mouth every 8 (eight) hours as needed for nausea.   potassium chloride  SA (KLOR-CON  M) 20 MEQ tablet Take 2 tablets (40 mEq total) by mouth daily.   spironolactone  (ALDACTONE ) 25 MG tablet Take 1 tablet (25 mg total) by mouth daily.   torsemide  (DEMADEX ) 20 MG tablet Take 3 tablets (60 mg total) by mouth daily.   No facility-administered encounter medications on file as of 06/10/2024.    Review of Systems:  Review of Systems  Constitutional:  Negative for appetite change, chills, fatigue and fever.  HENT:  Negative for congestion, hearing loss, rhinorrhea and sore throat.   Eyes: Negative.   Respiratory:  Positive for shortness of breath and wheezing. Negative for cough.   Cardiovascular:  Negative for chest pain, palpitations and leg swelling.  Gastrointestinal:  Negative for abdominal pain, constipation, diarrhea, nausea and vomiting.  Genitourinary:  Negative for dysuria.  Musculoskeletal:  Negative for arthralgias, back pain and myalgias.  Skin:  Negative for color change, rash and wound.  Neurological:  Negative for dizziness, weakness and headaches.  Psychiatric/Behavioral:  Negative for behavioral problems. The patient is not nervous/anxious.     Health Maintenance  Topic Date Due   DTaP/Tdap/Td (1 - Tdap) Never done   Pneumococcal Vaccine: 19-49 Years (1 of 2 - PCV) Never done   Hepatitis B Vaccines (1 of 3 - 19+ 3-dose series) Never done   Cervical Cancer Screening (HPV/Pap Cotest)  Never done   Colonoscopy  Never done   COVID-19 Vaccine (1 - 2024-25 season) Never done   INFLUENZA VACCINE  05/29/2024   Hepatitis C Screening  Completed   HIV Screening  Completed   HPV VACCINES  Aged Out   Meningococcal B Vaccine  Aged Out    Physical Exam: Vitals:   06/10/24 1456  BP: 134/68  Pulse: 94  Resp: 20  Temp: (!) 97.2 F (36.2 C)  SpO2: 92%  Weight: 258 lb 12.8 oz (117.4 kg)  Height: 5' 2 (1.575 m)   Body mass index is 47.34 kg/m. Physical  Exam Constitutional:      Appearance: Normal appearance. She is obese.  HENT:     Head: Normocephalic and atraumatic.     Nose: Nose normal.     Mouth/Throat:     Mouth: Mucous membranes are moist.  Eyes:     Conjunctiva/sclera: Conjunctivae normal.  Cardiovascular:     Rate and Rhythm: Normal rate and regular rhythm.  Pulmonary:     Effort: Pulmonary effort is normal.     Breath sounds: Wheezing present.  Abdominal:     General: Bowel sounds are normal.     Palpations: Abdomen is soft.  Musculoskeletal:        General: Normal range of motion.     Cervical back: Normal range of motion.  Skin:    General: Skin is warm and dry.  Neurological:     General: No focal deficit present.     Mental Status: She is alert and oriented to person, place, and time.  Psychiatric:        Mood and Affect: Mood normal.        Behavior: Behavior normal.        Thought Content: Thought content normal.        Judgment: Judgment normal.     Labs reviewed: Basic Metabolic Panel: Recent Labs    08/22/23 1108 11/21/23 0936 01/31/24 1225  NA 140 138 138  K 4.3 3.9 3.7  CL 108 109 108  CO2 23 21* 19*  GLUCOSE 84 91 90  BUN 17 15 15   CREATININE 0.82 0.93 0.84  CALCIUM 9.6 9.1 8.8*   Liver Function Tests: Recent Labs    06/17/23 1545  AST 18  ALT 16  ALKPHOS 60  BILITOT 0.4  PROT 7.1  ALBUMIN 3.3*   No results for input(s): LIPASE, AMYLASE in the last 8760 hours. No results for input(s): AMMONIA in the last 8760 hours. CBC: Recent Labs    06/17/23 1545  WBC 7.2  HGB 14.2  HCT 43.7  MCV 83.7  PLT 323   Lipid Panel: No results for input(s): CHOL, HDL, LDLCALC, TRIG, CHOLHDL, LDLDIRECT in the last 8760 hours. Lab Results  Component Value Date   HGBA1C 5.8 (H) 11/21/2023    Procedures since last visit: No results found.  Assessment/Plan  1. Acute asthmatic bronchitis (Primary) Cough, unspecified type Chest congestion -  Negative for COVID-19 and  influenza. Shortness of breath and wheezing present. Doxycycline  not expected to affect blood pressure or cause constipation. - Order chest x-ray  - Recommend Mucinex  (green type) twice daily for two weeks. - Prescribe doxycycline . - Continue albuterol  as needed, two puffs every four hours. - Continue Symbicort , two puffs twice daily as needed. - doxycycline  (VIBRA -TABS) 100 MG tablet; Take 1 tablet (100 mg total) by mouth 2 (two) times daily for 10 days.  Dispense: 20 tablet; Refill: 0 - POC COVID-19 BinaxNow - POCT Influenza A/B - DG Chest 2 View - guaiFENesin  (MUCINEX ) 600 MG 12 hr tablet; Take 1 tablet (600 mg total) by mouth 2 (two) times daily for 14 days. - doxycycline  (VIBRA -TABS) 100 MG tablet; Take 1 tablet (100 mg total) by mouth 2 (two) times daily for 10 days.  Dispense: 20 tablet; Refill: 0  2. Chronic systolic heart failure (HCC) -  - Continue Demodex 60 mg daily. - Continue Aldactone  25 mg daily. - Continue potassium chloride  SA, 40 mEq daily. - Continue Corlanor  2.5 mg by mouth twice a day. - Continue digoxin  0.125 mg daily. - Continue losartan  25 mg daily. - Continue Farxiga  10 mg daily. -  follow up with cardiology  3. Primary hypertension -  Blood pressure well-controlled.  - Continue Losartan  25 mg daily   Work-related absence Discussed FMLA for absence protection. - Provide doctor's note for work.      Labs/tests ordered:  chest x-ray, Influenza A/B and COVID-19 test   Return in about 2 weeks (around 06/24/2024).  Minetta Krisher Medina-Vargas, NP

## 2024-06-19 ENCOUNTER — Other Ambulatory Visit (HOSPITAL_COMMUNITY): Payer: Self-pay

## 2024-06-24 ENCOUNTER — Encounter: Admitting: Adult Health

## 2024-06-24 DIAGNOSIS — E7800 Pure hypercholesterolemia, unspecified: Secondary | ICD-10-CM

## 2024-06-24 DIAGNOSIS — J45909 Unspecified asthma, uncomplicated: Secondary | ICD-10-CM

## 2024-06-24 DIAGNOSIS — I5022 Chronic systolic (congestive) heart failure: Secondary | ICD-10-CM

## 2024-06-24 DIAGNOSIS — I10 Essential (primary) hypertension: Secondary | ICD-10-CM

## 2024-06-24 DIAGNOSIS — R7303 Prediabetes: Secondary | ICD-10-CM

## 2024-06-24 NOTE — Progress Notes (Signed)
 This encounter was created in error - please disregard.

## 2024-07-01 ENCOUNTER — Encounter (HOSPITAL_COMMUNITY): Payer: Self-pay

## 2024-07-01 ENCOUNTER — Inpatient Hospital Stay (HOSPITAL_COMMUNITY)
Admission: EM | Admit: 2024-07-01 | Discharge: 2024-07-07 | DRG: 287 | Disposition: A | Discharge status: Home / Self Care | Attending: Cardiology | Admitting: Cardiology

## 2024-07-01 ENCOUNTER — Emergency Department (HOSPITAL_COMMUNITY)

## 2024-07-01 ENCOUNTER — Other Ambulatory Visit: Payer: Self-pay

## 2024-07-01 DIAGNOSIS — Z833 Family history of diabetes mellitus: Secondary | ICD-10-CM

## 2024-07-01 DIAGNOSIS — Z91148 Patient's other noncompliance with medication regimen for other reason: Secondary | ICD-10-CM

## 2024-07-01 DIAGNOSIS — Z886 Allergy status to analgesic agent status: Secondary | ICD-10-CM

## 2024-07-01 DIAGNOSIS — I493 Ventricular premature depolarization: Secondary | ICD-10-CM | POA: Diagnosis present

## 2024-07-01 DIAGNOSIS — Z8249 Family history of ischemic heart disease and other diseases of the circulatory system: Secondary | ICD-10-CM

## 2024-07-01 DIAGNOSIS — Z6841 Body Mass Index (BMI) 40.0 and over, adult: Secondary | ICD-10-CM

## 2024-07-01 DIAGNOSIS — I5043 Acute on chronic combined systolic (congestive) and diastolic (congestive) heart failure: Secondary | ICD-10-CM | POA: Diagnosis present

## 2024-07-01 DIAGNOSIS — J42 Unspecified chronic bronchitis: Secondary | ICD-10-CM | POA: Diagnosis present

## 2024-07-01 DIAGNOSIS — I1310 Hypertensive heart and chronic kidney disease without heart failure, with stage 1 through stage 4 chronic kidney disease, or unspecified chronic kidney disease: Secondary | ICD-10-CM

## 2024-07-01 DIAGNOSIS — Z7951 Long term (current) use of inhaled steroids: Secondary | ICD-10-CM

## 2024-07-01 DIAGNOSIS — E66813 Obesity, class 3: Secondary | ICD-10-CM | POA: Diagnosis present

## 2024-07-01 DIAGNOSIS — Z9889 Other specified postprocedural states: Secondary | ICD-10-CM

## 2024-07-01 DIAGNOSIS — I509 Heart failure, unspecified: Principal | ICD-10-CM

## 2024-07-01 DIAGNOSIS — I5023 Acute on chronic systolic (congestive) heart failure: Secondary | ICD-10-CM | POA: Diagnosis not present

## 2024-07-01 DIAGNOSIS — Z809 Family history of malignant neoplasm, unspecified: Secondary | ICD-10-CM

## 2024-07-01 DIAGNOSIS — I472 Ventricular tachycardia, unspecified: Secondary | ICD-10-CM | POA: Diagnosis present

## 2024-07-01 DIAGNOSIS — I4729 Other ventricular tachycardia: Secondary | ICD-10-CM

## 2024-07-01 DIAGNOSIS — I5082 Biventricular heart failure: Secondary | ICD-10-CM | POA: Diagnosis present

## 2024-07-01 DIAGNOSIS — J45901 Unspecified asthma with (acute) exacerbation: Secondary | ICD-10-CM | POA: Diagnosis present

## 2024-07-01 DIAGNOSIS — E876 Hypokalemia: Secondary | ICD-10-CM | POA: Diagnosis present

## 2024-07-01 DIAGNOSIS — Z66 Do not resuscitate: Secondary | ICD-10-CM | POA: Diagnosis present

## 2024-07-01 DIAGNOSIS — N179 Acute kidney failure, unspecified: Secondary | ICD-10-CM | POA: Diagnosis present

## 2024-07-01 DIAGNOSIS — Z7984 Long term (current) use of oral hypoglycemic drugs: Secondary | ICD-10-CM

## 2024-07-01 DIAGNOSIS — T502X5A Adverse effect of carbonic-anhydrase inhibitors, benzothiadiazides and other diuretics, initial encounter: Secondary | ICD-10-CM | POA: Diagnosis present

## 2024-07-01 DIAGNOSIS — I428 Other cardiomyopathies: Secondary | ICD-10-CM | POA: Diagnosis present

## 2024-07-01 DIAGNOSIS — Z23 Encounter for immunization: Secondary | ICD-10-CM

## 2024-07-01 DIAGNOSIS — Z8701 Personal history of pneumonia (recurrent): Secondary | ICD-10-CM

## 2024-07-01 DIAGNOSIS — Z825 Family history of asthma and other chronic lower respiratory diseases: Secondary | ICD-10-CM

## 2024-07-01 DIAGNOSIS — I5022 Chronic systolic (congestive) heart failure: Secondary | ICD-10-CM

## 2024-07-01 DIAGNOSIS — E662 Morbid (severe) obesity with alveolar hypoventilation: Secondary | ICD-10-CM | POA: Diagnosis present

## 2024-07-01 DIAGNOSIS — Z79899 Other long term (current) drug therapy: Secondary | ICD-10-CM

## 2024-07-01 DIAGNOSIS — Z1152 Encounter for screening for COVID-19: Secondary | ICD-10-CM

## 2024-07-01 DIAGNOSIS — Z803 Family history of malignant neoplasm of breast: Secondary | ICD-10-CM

## 2024-07-01 LAB — I-STAT CHEM 8, ED
BUN: 13 mg/dL (ref 6–20)
Calcium, Ion: 1.09 mmol/L — ABNORMAL LOW (ref 1.15–1.40)
Chloride: 98 mmol/L (ref 98–111)
Creatinine, Ser: 0.9 mg/dL (ref 0.44–1.00)
Glucose, Bld: 102 mg/dL — ABNORMAL HIGH (ref 70–99)
HCT: 44 % (ref 36.0–46.0)
Hemoglobin: 15 g/dL (ref 12.0–15.0)
Potassium: 2.7 mmol/L — CL (ref 3.5–5.1)
Sodium: 138 mmol/L (ref 135–145)
TCO2: 24 mmol/L (ref 22–32)

## 2024-07-01 LAB — BASIC METABOLIC PANEL WITH GFR
Anion gap: 10 (ref 5–15)
BUN: 14 mg/dL (ref 6–20)
CO2: 25 mmol/L (ref 22–32)
Calcium: 8.8 mg/dL — ABNORMAL LOW (ref 8.9–10.3)
Chloride: 101 mmol/L (ref 98–111)
Creatinine, Ser: 0.85 mg/dL (ref 0.44–1.00)
GFR, Estimated: >60 mL/min (ref >60)
Glucose, Bld: 100 mg/dL — ABNORMAL HIGH (ref 70–99)
Potassium: 2.8 mmol/L — ABNORMAL LOW (ref 3.5–5.1)
Sodium: 136 mmol/L (ref 135–145)

## 2024-07-01 LAB — CBC
HCT: 40 % (ref 36.0–46.0)
Hemoglobin: 13 g/dL (ref 12.0–15.0)
MCH: 27.8 pg (ref 26.0–34.0)
MCHC: 32.5 g/dL (ref 30.0–36.0)
MCV: 85.5 fL (ref 80.0–100.0)
Platelets: 271 K/uL (ref 150–400)
RBC: 4.68 MIL/uL (ref 3.87–5.11)
RDW: 15.4 % (ref 11.5–15.5)
WBC: 5.6 K/uL (ref 4.0–10.5)
nRBC: 0 % (ref 0.0–0.2)

## 2024-07-01 LAB — RESP PANEL BY RT-PCR (RSV, FLU A&B, COVID)  RVPGX2
Influenza A by PCR: NEGATIVE
Influenza B by PCR: NEGATIVE
Resp Syncytial Virus by PCR: NEGATIVE
SARS Coronavirus 2 by RT PCR: NEGATIVE

## 2024-07-01 LAB — BRAIN NATRIURETIC PEPTIDE: B Natriuretic Peptide: 929.8 pg/mL — ABNORMAL HIGH (ref 0.0–100.0)

## 2024-07-01 LAB — HCG, SERUM, QUALITATIVE: Preg, Serum: NEGATIVE

## 2024-07-01 NOTE — ED Triage Notes (Signed)
 Patient reports multiple sick patient's at work exposing her to covid, one patient died today who had pneumonia which has her worried. Patient has hx of heart failure as well, endorses [facility] but believes it is from cough/congestion.

## 2024-07-01 NOTE — ED Triage Notes (Signed)
 Pt states that she has had cough and congestion x 2 weeks.

## 2024-07-02 ENCOUNTER — Observation Stay (HOSPITAL_COMMUNITY)

## 2024-07-02 ENCOUNTER — Telehealth (HOSPITAL_COMMUNITY): Payer: Self-pay | Admitting: Pharmacy Technician

## 2024-07-02 ENCOUNTER — Other Ambulatory Visit (HOSPITAL_COMMUNITY): Payer: Self-pay

## 2024-07-02 DIAGNOSIS — I493 Ventricular premature depolarization: Secondary | ICD-10-CM | POA: Diagnosis present

## 2024-07-02 DIAGNOSIS — I471 Supraventricular tachycardia, unspecified: Secondary | ICD-10-CM

## 2024-07-02 DIAGNOSIS — E66813 Obesity, class 3: Secondary | ICD-10-CM | POA: Diagnosis present

## 2024-07-02 DIAGNOSIS — J42 Unspecified chronic bronchitis: Secondary | ICD-10-CM | POA: Diagnosis present

## 2024-07-02 DIAGNOSIS — I472 Ventricular tachycardia, unspecified: Secondary | ICD-10-CM | POA: Diagnosis present

## 2024-07-02 DIAGNOSIS — I130 Hypertensive heart and chronic kidney disease with heart failure and stage 1 through stage 4 chronic kidney disease, or unspecified chronic kidney disease: Secondary | ICD-10-CM | POA: Diagnosis not present

## 2024-07-02 DIAGNOSIS — Z803 Family history of malignant neoplasm of breast: Secondary | ICD-10-CM | POA: Diagnosis not present

## 2024-07-02 DIAGNOSIS — I5043 Acute on chronic combined systolic (congestive) and diastolic (congestive) heart failure: Secondary | ICD-10-CM | POA: Diagnosis present

## 2024-07-02 DIAGNOSIS — I5031 Acute diastolic (congestive) heart failure: Secondary | ICD-10-CM | POA: Diagnosis not present

## 2024-07-02 DIAGNOSIS — Z7951 Long term (current) use of inhaled steroids: Secondary | ICD-10-CM | POA: Diagnosis not present

## 2024-07-02 DIAGNOSIS — Z1152 Encounter for screening for COVID-19: Secondary | ICD-10-CM | POA: Diagnosis not present

## 2024-07-02 DIAGNOSIS — Z66 Do not resuscitate: Secondary | ICD-10-CM | POA: Diagnosis present

## 2024-07-02 DIAGNOSIS — E662 Morbid (severe) obesity with alveolar hypoventilation: Secondary | ICD-10-CM | POA: Diagnosis present

## 2024-07-02 DIAGNOSIS — I5082 Biventricular heart failure: Secondary | ICD-10-CM | POA: Diagnosis present

## 2024-07-02 DIAGNOSIS — I5023 Acute on chronic systolic (congestive) heart failure: Secondary | ICD-10-CM | POA: Diagnosis present

## 2024-07-02 DIAGNOSIS — Z79899 Other long term (current) drug therapy: Secondary | ICD-10-CM | POA: Diagnosis not present

## 2024-07-02 DIAGNOSIS — T502X5A Adverse effect of carbonic-anhydrase inhibitors, benzothiadiazides and other diuretics, initial encounter: Secondary | ICD-10-CM | POA: Diagnosis present

## 2024-07-02 DIAGNOSIS — J45901 Unspecified asthma with (acute) exacerbation: Secondary | ICD-10-CM | POA: Diagnosis present

## 2024-07-02 DIAGNOSIS — N179 Acute kidney failure, unspecified: Secondary | ICD-10-CM | POA: Diagnosis present

## 2024-07-02 DIAGNOSIS — I428 Other cardiomyopathies: Secondary | ICD-10-CM | POA: Diagnosis present

## 2024-07-02 DIAGNOSIS — Z23 Encounter for immunization: Secondary | ICD-10-CM | POA: Diagnosis not present

## 2024-07-02 DIAGNOSIS — Z91148 Patient's other noncompliance with medication regimen for other reason: Secondary | ICD-10-CM | POA: Diagnosis not present

## 2024-07-02 DIAGNOSIS — Z6841 Body Mass Index (BMI) 40.0 and over, adult: Secondary | ICD-10-CM | POA: Diagnosis not present

## 2024-07-02 DIAGNOSIS — E876 Hypokalemia: Secondary | ICD-10-CM | POA: Diagnosis present

## 2024-07-02 DIAGNOSIS — Z8249 Family history of ischemic heart disease and other diseases of the circulatory system: Secondary | ICD-10-CM | POA: Diagnosis not present

## 2024-07-02 DIAGNOSIS — Z833 Family history of diabetes mellitus: Secondary | ICD-10-CM | POA: Diagnosis not present

## 2024-07-02 DIAGNOSIS — Z825 Family history of asthma and other chronic lower respiratory diseases: Secondary | ICD-10-CM | POA: Diagnosis not present

## 2024-07-02 DIAGNOSIS — Z7984 Long term (current) use of oral hypoglycemic drugs: Secondary | ICD-10-CM | POA: Diagnosis not present

## 2024-07-02 LAB — BASIC METABOLIC PANEL WITH GFR
Anion gap: 17 — ABNORMAL HIGH (ref 5–15)
Anion gap: 17 — ABNORMAL HIGH (ref 5–15)
BUN: 9 mg/dL (ref 6–20)
BUN: 14 mg/dL (ref 6–20)
CO2: 20 mmol/L — ABNORMAL LOW (ref 22–32)
CO2: 20 mmol/L — ABNORMAL LOW (ref 22–32)
Calcium: 9 mg/dL (ref 8.9–10.3)
Calcium: 9.5 mg/dL (ref 8.9–10.3)
Chloride: 100 mmol/L (ref 98–111)
Chloride: 99 mmol/L (ref 98–111)
Creatinine, Ser: 0.96 mg/dL (ref 0.44–1.00)
Creatinine, Ser: 1.38 mg/dL — ABNORMAL HIGH (ref 0.44–1.00)
GFR, Estimated: >60 mL/min (ref >60)
GFR, Estimated: 48 mL/min — ABNORMAL LOW (ref >60)
Glucose, Bld: 127 mg/dL — ABNORMAL HIGH (ref 70–99)
Glucose, Bld: 147 mg/dL — ABNORMAL HIGH (ref 70–99)
Potassium: 3.1 mmol/L — ABNORMAL LOW (ref 3.5–5.1)
Potassium: 3.6 mmol/L (ref 3.5–5.1)
Sodium: 137 mmol/L (ref 135–145)
Sodium: 136 mmol/L (ref 135–145)

## 2024-07-02 LAB — CBC
HCT: 42.8 % (ref 36.0–46.0)
Hemoglobin: 13.9 g/dL (ref 12.0–15.0)
MCH: 27.4 pg (ref 26.0–34.0)
MCHC: 32.5 g/dL (ref 30.0–36.0)
MCV: 84.3 fL (ref 80.0–100.0)
Platelets: 324 K/uL (ref 150–400)
RBC: 5.08 MIL/uL (ref 3.87–5.11)
RDW: 15.2 % (ref 11.5–15.5)
WBC: 5.8 K/uL (ref 4.0–10.5)
nRBC: 0 % (ref 0.0–0.2)

## 2024-07-02 LAB — ECHOCARDIOGRAM COMPLETE
AR max vel: 2.07 cm2
AV Peak grad: 5 mmHg
Ao pk vel: 1.12 m/s
Area-P 1/2: 6.83 cm2
Est EF: <20
Height: 61 in
S' Lateral: 5.76 cm
Weight: 4129.6 [oz_av]

## 2024-07-02 LAB — MAGNESIUM
Magnesium: 2.1 mg/dL (ref 1.7–2.4)
Magnesium: 1.9 mg/dL (ref 1.7–2.4)

## 2024-07-02 LAB — MRSA NEXT GEN BY PCR, NASAL: MRSA by PCR Next Gen: NOT DETECTED

## 2024-07-02 LAB — HIV ANTIBODY (ROUTINE TESTING W REFLEX): HIV Screen 4th Generation wRfx: NONREACTIVE

## 2024-07-02 LAB — TROPONIN I (HIGH SENSITIVITY): Troponin I (High Sensitivity): 17 ng/L (ref <18)

## 2024-07-02 MED ORDER — PROCHLORPERAZINE EDISYLATE 10 MG/2ML IJ SOLN
5.0000 mg | Freq: Four times a day (QID) | INTRAMUSCULAR | Status: DC | PRN
  Administered 2024-07-02 – 2024-07-03 (×2): 5 mg via INTRAVENOUS
  Filled 2024-07-02 (×2): qty 2

## 2024-07-02 MED ORDER — ACETAMINOPHEN 325 MG PO TABS: 650.0000 mg | ORAL_TABLET | Freq: Four times a day (QID) | ORAL | Status: DC | PRN

## 2024-07-02 MED ORDER — SPIRONOLACTONE 25 MG PO TABS
25.0000 mg | ORAL_TABLET | Freq: Every day | ORAL | Status: DC
  Administered 2024-07-02 – 2024-07-07 (×6): 25 mg via ORAL
  Filled 2024-07-02 (×6): qty 1

## 2024-07-02 MED ORDER — PNEUMOCOCCAL 20-VAL CONJ VACC 0.5 ML IM SUSY
0.5000 mL | PREFILLED_SYRINGE | INTRAMUSCULAR | Status: AC
  Administered 2024-07-04: 0.5 mL via INTRAMUSCULAR
  Filled 2024-07-02: qty 0.5

## 2024-07-02 MED ORDER — POTASSIUM CHLORIDE CRYS ER 20 MEQ PO TBCR
40.0000 meq | EXTENDED_RELEASE_TABLET | Freq: Once | ORAL | Status: AC
  Administered 2024-07-02: 40 meq via ORAL
  Filled 2024-07-02: qty 2

## 2024-07-02 MED ORDER — IPRATROPIUM-ALBUTEROL 0.5-2.5 (3) MG/3ML IN SOLN
3.0000 mL | Freq: Four times a day (QID) | RESPIRATORY_TRACT | Status: DC | PRN
  Administered 2024-07-04: 3 mL via RESPIRATORY_TRACT
  Filled 2024-07-02: qty 3

## 2024-07-02 MED ORDER — ACETAMINOPHEN 650 MG RE SUPP: 650.0000 mg | Freq: Four times a day (QID) | RECTAL | Status: DC | PRN

## 2024-07-02 MED ORDER — POLYETHYLENE GLYCOL 3350 17 G PO PACK: 17.0000 g | PACK | Freq: Every day | ORAL | Status: DC | PRN

## 2024-07-02 MED ORDER — POTASSIUM CHLORIDE CRYS ER 20 MEQ PO TBCR
20.0000 meq | EXTENDED_RELEASE_TABLET | Freq: Three times a day (TID) | ORAL | Status: AC
  Administered 2024-07-02 (×3): 20 meq via ORAL
  Filled 2024-07-02 (×3): qty 1

## 2024-07-02 MED ORDER — LEVALBUTEROL HCL 0.63 MG/3ML IN NEBU
0.6300 mg | INHALATION_SOLUTION | RESPIRATORY_TRACT | Status: AC
  Administered 2024-07-02: 0.63 mg via RESPIRATORY_TRACT
  Filled 2024-07-02: qty 3

## 2024-07-02 MED ORDER — DIGOXIN 125 MCG PO TABS
0.1250 mg | ORAL_TABLET | Freq: Every day | ORAL | Status: DC
  Administered 2024-07-02 – 2024-07-07 (×6): 0.125 mg via ORAL
  Filled 2024-07-02 (×6): qty 1

## 2024-07-02 MED ORDER — GUAIFENESIN ER 600 MG PO TB12
600.0000 mg | ORAL_TABLET | Freq: Two times a day (BID) | ORAL | Status: DC
  Administered 2024-07-02 – 2024-07-06 (×9): 600 mg via ORAL
  Filled 2024-07-02 (×9): qty 1

## 2024-07-02 MED ORDER — FUROSEMIDE 10 MG/ML IJ SOLN
80.0000 mg | Freq: Once | INTRAMUSCULAR | Status: AC
  Administered 2024-07-02: 80 mg via INTRAVENOUS
  Filled 2024-07-02: qty 8

## 2024-07-02 MED ORDER — PERFLUTREN LIPID MICROSPHERE
1.0000 mL | INTRAVENOUS | Status: AC | PRN
  Administered 2024-07-02: 4 mL via INTRAVENOUS

## 2024-07-02 MED ORDER — MELATONIN 5 MG PO TABS: 5.0000 mg | ORAL_TABLET | Freq: Every evening | ORAL | Status: DC | PRN

## 2024-07-02 MED ORDER — ENOXAPARIN SODIUM 40 MG/0.4ML IJ SOSY: 40.0000 mg | PREFILLED_SYRINGE | INTRAMUSCULAR | Status: DC

## 2024-07-02 MED ORDER — FUROSEMIDE 10 MG/ML IJ SOLN: 40.0000 mg | Freq: Two times a day (BID) | INTRAMUSCULAR | Status: DC

## 2024-07-02 MED ORDER — DAPAGLIFLOZIN PROPANEDIOL 10 MG PO TABS
10.0000 mg | ORAL_TABLET | Freq: Every day | ORAL | Status: DC
  Administered 2024-07-02 – 2024-07-07 (×6): 10 mg via ORAL
  Filled 2024-07-02 (×6): qty 1

## 2024-07-02 MED ORDER — GUAIFENESIN ER 600 MG PO TB12: 600.0000 mg | ORAL_TABLET | Freq: Two times a day (BID) | ORAL | Status: DC | PRN

## 2024-07-02 MED ORDER — INFLUENZA VIRUS VACC SPLIT PF (FLUZONE) 0.5 ML IM SUSY
0.5000 mL | PREFILLED_SYRINGE | INTRAMUSCULAR | Status: AC
  Administered 2024-07-04: 0.5 mL via INTRAMUSCULAR
  Filled 2024-07-02: qty 0.5

## 2024-07-02 MED ORDER — MAGNESIUM SULFATE 2 GM/50ML IV SOLN
2.0000 g | Freq: Once | INTRAVENOUS | Status: AC
  Administered 2024-07-02: 2 g via INTRAVENOUS
  Filled 2024-07-02: qty 50

## 2024-07-02 MED ORDER — MONTELUKAST SODIUM 10 MG PO TABS: 10.0000 mg | ORAL_TABLET | Freq: Every day | ORAL | Status: DC

## 2024-07-02 MED ORDER — MORPHINE SULFATE (PF) 2 MG/ML IV SOLN: 2.0000 mg | INTRAVENOUS | Status: DC | PRN

## 2024-07-02 MED ORDER — BUDESONIDE 0.25 MG/2ML IN SUSP
0.2500 mg | Freq: Two times a day (BID) | RESPIRATORY_TRACT | Status: DC
  Administered 2024-07-02 – 2024-07-06 (×9): 0.25 mg via RESPIRATORY_TRACT
  Filled 2024-07-02 (×10): qty 2

## 2024-07-02 MED ORDER — ENOXAPARIN SODIUM 40 MG/0.4ML IJ SOSY
40.0000 mg | PREFILLED_SYRINGE | INTRAMUSCULAR | Status: DC
  Administered 2024-07-02: 40 mg via SUBCUTANEOUS
  Filled 2024-07-02 (×2): qty 0.4

## 2024-07-02 MED ORDER — MONTELUKAST SODIUM 10 MG PO TABS
10.0000 mg | ORAL_TABLET | Freq: Every day | ORAL | Status: DC
  Administered 2024-07-02 – 2024-07-06 (×5): 10 mg via ORAL
  Filled 2024-07-02 (×5): qty 1

## 2024-07-02 MED ORDER — FUROSEMIDE 10 MG/ML IJ SOLN
80.0000 mg | Freq: Two times a day (BID) | INTRAMUSCULAR | Status: DC
  Administered 2024-07-02 – 2024-07-03 (×2): 80 mg via INTRAVENOUS
  Filled 2024-07-02 (×2): qty 8

## 2024-07-02 MED ORDER — PREDNISONE 20 MG PO TABS
60.0000 mg | ORAL_TABLET | Freq: Every day | ORAL | Status: DC
  Administered 2024-07-02 – 2024-07-03 (×2): 60 mg via ORAL
  Filled 2024-07-02 (×2): qty 3

## 2024-07-02 NOTE — Consult Note (Addendum)
 Advanced Heart Failure Team Consult Note   Primary Physician: Medina-Vargas, Monina C, NP HF Cardiologist:  Dr. Fenris Cauble Reason for Consultation: Acute on Chronic Systolic Heart Fialure HPI:    Carolyn Oconnor is seen today for evaluation of acute on chronic systolic heart failure at the request of Dr. Arlice.   Ms. Wire is a 48 y.o. AAF w/ h/o asthma, obesity and recently diagnosed systolic heart failure.    Admitted 4/24 with acute systolic HF. Presented to the ED w/ several weeks progressive SOB, orthopnea and b/l LE. Also felt to have component of acute asthma exacerbation. Diuresed with IV Lasix , started prednisone  and bronchodilators. Echo showed LVEF 20-25%, mild- mod MR, RV hyperdynamic. L/RHC: normal coronaries, marked volume overload with persevered CO. cMRI 5/24: LVEF 25%, RVEF 42%. Non ischemic cardiomyopathy. GDMT started. Discharge weight 248 lbs.    Last seen for HF follow up 4/25 feeling poorly with continued shortness of breath and volume overload. Torsemide  was increased and repeat echo ordered. She was lost to follow up.   Presented to [facility]01[facility]24 with progressive dyspnea and productive cough over the past 2 weeks. On arrival, she was normotensive 118/95 and tachycardiac 113. ECG showed sinus tachycardia at 105 bpm with occasional PVCs. CXR with cardiomegaly and chronic peribronchial/interstitial thickening.  Labs notable for K 2.8, CO2 25, BUN/Cr 14/0.85, BNP 929, hs-trop 17, RVP negative. Potassium was replaced and she was started on IV Lasix . Echo ordered and AHF consulted.   Has been having productive cough for weeks. Reports that she has been taking her medications intermittently. Lives with her boyfriend, who she expresses also has difficulties with health. She works full time as a Scientist, clinical (histocompatibility and immunogenetics). Today, she is hypervolemic on exam, reports that shortness of breath and cough has improved.   Home Medications Prior to Admission medications   Medication Sig Start Date End  Date Taking? Authorizing Provider  acetaminophen  (TYLENOL ) 325 MG tablet Take 2 tablets (650 mg total) by mouth every 6 (six) hours as needed for mild pain (or Fever >/= 101). Patient taking differently: Take 325-650 mg by mouth every 6 (six) hours as needed for mild pain (pain score 1-3) (or Fever >/= 101). 03/04/23  Yes Sheikh, Omair Latif, DO  albuterol  (PROVENTIL  HFA;VENTOLIN  HFA) 108 (90 Base) MCG/ACT inhaler Inhale 2 puffs into the lungs every 4 (four) hours as needed (cough and wheeze). For asthma attacks. 03/09/16  Yes Noreen Tonnie BRAVO, MD  bisoprolol  (ZEBETA ) 5 MG tablet Take 1/2 tablet (2.5 mg total) by mouth 2 (two) times daily. 03/12/23  Yes Hayes Beckey LITTIE, NP  budesonide -formoterol  (SYMBICORT ) 80-4.5 MCG/ACT inhaler Inhale 2 puffs into the lungs 2 (two) times daily as needed.   Yes [provider]  dapagliflozin  propanediol (FARXIGA ) 10 MG TABS tablet Take 1 tablet (10 mg total) by mouth daily before breakfast. 08/22/23  Yes Philamena Kramar, Toribio SAUNDERS, MD  fluticasone  (FLONASE ) 50 MCG/ACT nasal spray Place 2 sprays into both nostrils 2 (two) times daily as needed for allergies or rhinitis.   Yes [provider]  losartan  (COZAAR ) 25 MG tablet Take 1 tablet (25 mg total) by mouth daily. Patient taking differently: Take 25 mg by mouth at bedtime. 11/21/23  Yes Hayes Beckey LITTIE, NP  naproxen sodium (ALEVE) 220 MG tablet Take 220 mg by mouth daily as needed (Pain, heart, failure, cramps.).   Yes [provider]  potassium chloride  SA (KLOR-CON  M) 20 MEQ tablet Take 2 tablets (40 mEq total) by mouth daily. Patient taking differently:  Take 20 mEq by mouth 2 (two) times daily. 01/31/24  Yes Milford, Harlene HERO, FNP  torsemide  (DEMADEX ) 20 MG tablet Take 3 tablets (60 mg total) by mouth daily. Patient taking differently: Take 60 mg by mouth at bedtime. 01/31/24  Yes Milford, Harlene HERO, FNP    Past Medical History: Past Medical History:  Diagnosis Date   Allergic rhinitis    Asthma     Bronchitis, chronic (HCC)    CHF (congestive heart failure) (HCC)    Pneumonia     Past Surgical History: Past Surgical History:  Procedure Laterality Date   none     RIGHT/LEFT HEART CATH AND CORONARY ANGIOGRAPHY N/A 03/01/2023   Procedure: RIGHT/LEFT HEART CATH AND CORONARY ANGIOGRAPHY;  Surgeon: Cherrie Toribio SAUNDERS, MD;  Location: [facility];  Service: Cardiovascular;  Laterality: N/A;    Family History: Family History  Problem Relation Age of Onset   Allergies Mother    Heart disease Mother    Allergies Father    Asthma Father    Diabetes Sister    Asthma Sister    Allergies Sister    Cancer Sister    Asthma Brother    Allergies Brother    Heart disease Maternal Aunt    Breast cancer Maternal Aunt     Social History: Social History   Socioeconomic History   Marital status: Significant Other    Spouse name: Not on file   Number of children: 0   Years of education: Not on file   Highest education level: Associate degree: academic program  Occupational History   Occupation: [employer]  Tobacco Use   Smoking status: Never    Passive exposure: Yes   Smokeless tobacco: Never   Tobacco comments:    Secondhand from Father  Vaping Use   Vaping status: Never Used  Substance and Sexual Activity   Alcohol use: No    Alcohol/week: 0.0 standard drinks of alcohol   Drug use: No    Frequency: 1.0 times per week   Sexual activity: Not on file  Other Topics Concern   Not on file  Social History Narrative   Single, lives with boyfriend   CNA, Editor, commissioning   No recent travel      Adult nurse Pulmonary:   Originally from [place]. Always lived in [place]. Previously hasn't traveled outside of the state. 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 No pets currently. She does have carpet & draperies in her home as well as the bedroom. No indoor plants. No bird exposure. No mold exposure. Enjoys drawing & reading.       Diet:  Left Blank      Caffeine: Yes      Married, Single if yes what year:       Do you live in a house, apartment, assisted living, condo, trailer, ect: apartment      Is it one or more stories: Left Blank      How many persons live in your home? 2      Pets: no      Highest level or education completed: [employer]      Current/Past profession: Computer      Exercise:  Yes                Type and how often: 2 time a day         Living Will: Yes   DNR: Yes  POA/HPOA: Yes      Functional Status:   Do you have difficulty bathing or dressing yourself? No   Do you have difficulty preparing food or eating? No   Do you have difficulty managing your medications? No   Do you have difficulty managing your finances? No   Do you have difficulty affording your medications? No   Social Drivers of Corporate investment banker Strain: Low Risk  (06/10/2024)   Overall Financial Resource Strain (CARDIA)    Difficulty of Paying Living Expenses: Not hard at all  Food Insecurity: No Food Insecurity (07/02/2024)   Hunger Vital Sign    Worried About Running Out of Food in the Last Year: Never true    Ran Out of Food in the Last Year: Never true  Transportation Needs: No Transportation Needs (07/02/2024)   PRAPARE - Administrator, Civil Service (Medical): No    Lack of Transportation (Non-Medical): No  Physical Activity: Sufficiently Active (06/10/2024)   Exercise Vital Sign    Days of Exercise per Week: 3 days    Minutes of Exercise per Session: 70 min  Stress: Stress Concern Present (06/10/2024)   [employer] of Occupational Health - Occupational Stress Questionnaire    Feeling of Stress: To some extent  Social Connections: Moderately Isolated (06/10/2024)   Social Connection and Isolation Panel    Frequency of Communication with Friends and Family: Once a week    Frequency of Social Gatherings with Friends and Family: Once a week    Attends Religious Services: Never    Automotive engineer or Organizations: Yes    Attends Banker Meetings: 1 to 4 times per year    Marital Status: Living with partner   Allergies:  Allergies  Allergen Reactions   Aspirin      Patient states that it makes her go into an asthma attack.    Objective:    Vital Signs:   Temp:  [97.4 F (36.3 C)-98.4 F (36.9 C)] 98.2 F (36.8 C) (09/04 0917) Pulse Rate:  [92-113] 100 (09/04 0917) Resp:  [18-20] 20 (09/04 0917) BP: (94-118)/(73-95) 94/73 (09/04 0917) SpO2:  [93 %-95 %] 93 % (09/04 0917) Weight:  [117.1 kg-117.9 kg] 117.1 kg (09/04 0533) Last BM Date : 07/01/24  Weight change: Filed Weights   07/02/24 0350 07/02/24 0533  Weight: 117.9 kg 117.1 kg   Intake/Output:  Intake/Output Summary (Last 24 hours) at 07/02/2024 1012 Last data filed at 07/02/2024 1000 Gross per 24 hour  Intake 240 ml  Output 900 ml  Net -660 ml    Physical Exam    General: Disheveled, obese appearing. No distress on RA Cardiac: JVP to jaw. S1 and S2 present. No murmurs or rub. Resp: inspiratory and expiratory wheezing Extremities: Warm and dry.  1+ BLE edema.  Neuro: Alert and oriented x3. Affect pleasant.   Telemetry   SR 90s (personally reviewed)  Labs   Basic Metabolic Panel: Recent Labs  Lab 07/01/24 2030 07/01/24 2035 07/02/24 0410 07/02/24 0726  NA 136 138  --  137  K 2.8* 2.7*  --  3.1*  CL 101 98  --  100  CO2 25  --   --  20*  GLUCOSE 100* 102*  --  127*  BUN 14 13  --  9  CREATININE 0.85 0.90  --  0.96  CALCIUM 8.8*  --   --  9.0  MG  --   --  2.1 1.9  CBC: Recent Labs  Lab 07/01/24 2030 07/01/24 2035 07/02/24 0726  WBC 5.6  --  5.8  HGB 13.0 15.0 13.9  HCT 40.0 44.0 42.8  MCV 85.5  --  84.3  PLT 271  --  324   BNP (last 3 results) Recent Labs    11/21/23 0936 01/31/24 1225 07/01/24 2030  BNP 480.5* 819.2* 929.8*   Medications:    Current Medications:  budesonide  (PULMICORT ) nebulizer solution  0.25 mg Nebulization BID   enoxaparin   (LOVENOX ) injection  40 mg Subcutaneous Q24H   furosemide   40 mg Intravenous BID   [START ON 07/03/2024] influenza vac split trivalent PF  0.5 mL Intramuscular Tomorrow-1000   montelukast   10 mg Oral QHS   [START ON 07/03/2024] pneumococcal 20-valent conjugate vaccine  0.5 mL Intramuscular Tomorrow-1000   potassium chloride   20 mEq Oral TID    Infusions:   Patient Profile   Ms. Player is a 48 y.o. AAF w/ h/o asthma, obesity and systolic heart failure.   Assessment/Plan   1. Acute on Chronic Systolic Heart Failure, NICM - Diagnosed in 4/24 - Echo 4/24: EF 20-25% (looks closer to 30-35%), RV hyperdynamic  - R/LHC 5/24: no CAD, RA 13, PA 59/42 (48), PCW 33, LVEDP 35. Fick CO 6.13, CI 2.87. TD CO/CI: 6.3/2.9. PAPi 1.3 - cMRI 5/24: LVEF 25%, RVEF 42%. NICM.  - Echo 10/24 EF 30-35% RV ok (limited images)  - Remains volume up on exam. Increase IV Lasix  to 80 mg bid. Place TED hose. - Restart spiro 25 mg daily  - Restart digoxin  0.125 mg daily  - Restart Farxiga  10 mg daily - BP too low for ? blocker or ARB at this time - repeat echo pending   2. Morbid obesity - Body mass index is 48.77 kg/m. - Has seen PharmD for GLP1RA. A1C 5.8.    3. H/o NSVT - occasional PVCs on tele  4. Suspect OSA/OHS - sleep study ordered, has not completed - may need PFTs in outpatient  Length of Stay: 0  [place] Lee, NP  07/02/2024, 10:12 AM  Advanced Heart Failure Team Pager [phone] (M-F; 7a - 5p)  Please contact [facility] for night-coverage after hours (4p -7a ) and weekends on [email or web address]  Patient seen and examined with the above-signed Advanced Practice Provider and/or Housestaff. I personally reviewed laboratory data, imaging studies and relevant notes. I independently examined the patient and formulated the important aspects of the plan. I have edited the note to reflect any of my changes or salient points. I have personally discussed the plan with the patient and/or family.  48 year old  woman with a history of morbid obesity, asthma, noncompliance and severe systolic heart failure due to nonischemic cardiomyopathy EF 25%.  Seen in heart failure clinic several months ago with volume overload.  Her regimen was adjusted but she never followed up.  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  She has been intermittently compliant with her medicines.  Over the past several weeks has noticed increased edema and shortness of breath.  She presented to the ER yesterday and was admitted for heart failure flare.  She is diuresing with IV Lasix .  She says her breathing feels better but not back to baseline yet.  Blood pressure has been soft.  On exam she is sitting up in bed no acute distress. HEENT: Normal Neck: Supple JVP to jaw. Cardiac: Regular rate and rhythm no obvious murmurs. Abdomen obese nontender nondistended Extremities: Well-perfused.  1+  edema.  No cyanosis or clubbing no rash.  48 year old female with known nonischemic cardiomyopathy.  She is now admitted with a heart failure flare and volume overload in the setting of noncompliance with her regimen.  She is responding well to IV Lasix .  Will continue this.  Will restart GDMT as tolerated by her blood pressure.  Will follow her renal function closely.  Will plan to repeat echocardiogram.  I spent a significant amount of time educating her on the importance of medication compliance and also for compliance with follow-up in the heart failure clinic.  She refuses Unna boots.  Will place compression hose.  Toribio Fuel, MD  1:45 PM

## 2024-07-02 NOTE — Progress Notes (Signed)
 Heart Failure Navigator Progress Note  Assessed for Heart & Vascular TOC clinic readiness.  Patient does not meet criteria due to Advanced Heart Failure Team patient of Dr. Gala Romney. .   Navigator will sign off at this time.   Rhae Hammock, BSN, Scientist, clinical (histocompatibility and immunogenetics) Only

## 2024-07-02 NOTE — H&P (Signed)
 History and Physical    Carolyn Oconnor FMW:[id number] DOB: 1976/09/01 DOA: 07/01/2024  DOS: the patient was seen and examined on 07/01/2024  PCP: Phyllis Jereld BROCKS, NP   Patient coming from: Home  I have personally briefly reviewed patient's [facility]  HPI:   Carolyn Oconnor is a 48 y.o. year old female with past medical history of HFrEF LVEF 25-30% with Grade II diastolic dysfunction in October 2024, asthma, allergic rhinitis, and obesity. She presents to [facility] emergency department for a 2-week history of dyspnea that has been worsening over the last several days.  The dyspnea is worse with exertion and resolves with rest.  She endorses orthopnea and a productive cough and reports chest soreness with coughing.  She also endorses dull 4 out of 10 chest pain that began this morning after transfer to inpatient bed.  She denies associated fever or abdominal pain. She does endorse nausea and an episode of non-bloody, non-bilious emesis this morning. She also endorses bilateral extremity edema and missing doses of her home meds.   ED Course: On arrival to [facility] ED patient was noted to be afebrile temp 36.7 C, BP 118/95, HR 113, RR 20, SpO2 90% on room air.  CXR obtained and shows cardiomegaly and chronic peribronchial and interstitial thickening.  No acute abnormalities found.  Labs notable for negative COVID, flu, RSV, BNP 929, potassium 2.7, and iCal 1.09.  She was given 80 mg IV Lasix , 40 mEq of potassium and started on 20 mEq TID. TRH contacted for admission.  Review of Systems: As mentioned in the history of present illness. All other systems reviewed and are negative.  Review of Systems  Constitutional:  Negative for chills and fever.  HENT:  Positive for congestion.   Eyes:  Negative for blurred vision.  Respiratory:  Positive for cough, sputum production, shortness of breath and wheezing.   Cardiovascular:  Positive for  chest pain, orthopnea and leg swelling. Negative for palpitations.  Gastrointestinal:  Positive for nausea and vomiting. Negative for abdominal pain, constipation, diarrhea and heartburn.  Neurological:  Negative for focal weakness and weakness.  All other systems reviewed and are negative.   Past Medical History:  Diagnosis Date   Allergic rhinitis    Asthma    Bronchitis, chronic (HCC)    CHF (congestive heart failure) (HCC)    Pneumonia     Past Surgical History:  Procedure Laterality Date   none     RIGHT/LEFT HEART CATH AND CORONARY ANGIOGRAPHY N/A 03/01/2023   Procedure: RIGHT/LEFT HEART CATH AND CORONARY ANGIOGRAPHY;  Surgeon: Cherrie Toribio SAUNDERS, MD;  Location: [facility] INVASIVE CV LAB;  Service: Cardiovascular;  Laterality: N/A;     reports that she has never smoked. She has been exposed to tobacco smoke. She has never used smokeless tobacco. She reports that she does not drink alcohol and does not use drugs.  Allergies  Allergen Reactions   Aspirin      Patient states that it makes her go into an asthma attack.     Family History  Problem Relation Age of Onset   Allergies Mother    Heart disease Mother    Allergies Father    Asthma Father    Diabetes Sister    Asthma Sister    Allergies Sister    Cancer Sister    Asthma Brother    Allergies Brother    Heart disease Maternal Aunt    Breast cancer  Maternal Aunt     Prior to Admission medications   Medication Sig Start Date End Date Taking? Authorizing Provider  acetaminophen  (TYLENOL ) 325 MG tablet Take 2 tablets (650 mg total) by mouth every 6 (six) hours as needed for mild pain (or Fever >/= 101). Patient taking differently: Take 325-650 mg by mouth every 6 (six) hours as needed for mild pain (pain score 1-3) (or Fever >/= 101). 03/04/23  Yes Sheikh, Omair Latif, DO  albuterol  (PROVENTIL  HFA;VENTOLIN  HFA) 108 (90 Base) MCG/ACT inhaler Inhale 2 puffs into the lungs every 4 (four) hours as needed (cough and wheeze). For  asthma attacks. 03/09/16  Yes Noreen Tonnie BRAVO, MD  bisoprolol  (ZEBETA ) 5 MG tablet Take 1/2 tablet (2.5 mg total) by mouth 2 (two) times daily. 03/12/23  Yes Hayes Beckey CROME, NP  budesonide -formoterol  (SYMBICORT ) 80-4.5 MCG/ACT inhaler Inhale 2 puffs into the lungs 2 (two) times daily as needed.   Yes [provider]  dapagliflozin  propanediol (FARXIGA ) 10 MG TABS tablet Take 1 tablet (10 mg total) by mouth daily before breakfast. 08/22/23  Yes Bensimhon, Toribio SAUNDERS, MD  fluticasone  (FLONASE ) 50 MCG/ACT nasal spray Place 2 sprays into both nostrils 2 (two) times daily as needed for allergies or rhinitis.   Yes [provider]  losartan  (COZAAR ) 25 MG tablet Take 1 tablet (25 mg total) by mouth daily. Patient taking differently: Take 25 mg by mouth at bedtime. 11/21/23  Yes Hayes Beckey CROME, NP  naproxen sodium (ALEVE) 220 MG tablet Take 220 mg by mouth daily as needed (Pain, heart, failure, cramps.).   Yes [provider]  potassium chloride  SA (KLOR-CON  M) 20 MEQ tablet Take 2 tablets (40 mEq total) by mouth daily. Patient taking differently: Take 20 mEq by mouth 2 (two) times daily. 01/31/24  Yes Milford, Harlene HERO, FNP  torsemide  (DEMADEX ) 20 MG tablet Take 3 tablets (60 mg total) by mouth daily. Patient taking differently: Take 60 mg by mouth at bedtime. 01/31/24  Yes Glena Harlene HERO, [place]    Physical Exam: Vitals:   07/02/24 0148 07/02/24 0350 07/02/24 0533 07/02/24 0550  BP: 106/75   112/88  Pulse: 92   (!) 105  Resp: 20   18  Temp: (!) 97.4 F (36.3 C)   98.4 F (36.9 C)  TempSrc:    Oral  SpO2: 94%   95%  Weight:  117.9 kg 117.1 kg   Height:  5' 2 (1.575 m) 5' 1 (1.549 m)     Physical Exam Vitals and nursing note reviewed.  Constitutional:      Appearance: She is obese.  HENT:     Head: Normocephalic.  Eyes:     Pupils: Pupils are equal, round, and reactive to light.  Cardiovascular:     Rate and Rhythm: Normal rate and regular rhythm.     Heart  sounds: No murmur heard.    No friction rub. No gallop.  Pulmonary:     Breath sounds: Wheezing and rhonchi present.  Chest:     Chest wall: Tenderness present.  Abdominal:     General: There is no distension.     Tenderness: There is no abdominal tenderness. There is no guarding.  Musculoskeletal:     Cervical back: Normal range of motion.  Skin:    General: Skin is warm and dry.     Capillary Refill: Capillary refill takes less than 2 seconds.  Neurological:     General: No focal deficit present.     Mental  Status: She is alert and oriented to person, place, and time.      Labs on Admission: I have personally reviewed following labs and imaging studies  CBC: Recent Labs  Lab 07/01/24 2030 07/01/24 2035 07/02/24 0534  WBC 5.6  --  6.0  HGB 13.0 15.0 13.9  HCT 40.0 44.0 42.9  MCV 85.5  --  84.4  PLT 271  --  306   Basic Metabolic Panel: Recent Labs  Lab 07/01/24 2030 07/01/24 2035 07/02/24 0410 07/02/24 0534  NA 136 138  --   --   K 2.8* 2.7*  --   --   CL 101 98  --   --   CO2 25  --   --   --   GLUCOSE 100* 102*  --   --   BUN 14 13  --   --   CREATININE 0.85 0.90  --  0.88  CALCIUM 8.8*  --   --   --   MG  --   --  2.1  --    GFR: Estimated Creatinine Clearance: 95.2 mL/min (by C-G formula based on SCr of 0.88 mg/dL). Liver Function Tests: No results for input(s): AST, ALT, ALKPHOS, BILITOT, PROT, ALBUMIN in the last 168 hours. No results for input(s): LIPASE, AMYLASE in the last 168 hours. No results for input(s): AMMONIA in the last 168 hours. Coagulation Profile: No results for input(s): INR, PROTIME in the last 168 hours. Cardiac Enzymes: No results for input(s): CKTOTAL, CKMB, CKMBINDEX, TROPONINI, TROPONINIHS in the last 168 hours. BNP (last 3 results) Recent Labs    11/21/23 0936 01/31/24 1225 07/01/24 2030  BNP 480.5* 819.2* 929.8*   HbA1C: No results for input(s): HGBA1C in the last 72 hours. CBG: No  results for input(s): GLUCAP in the last 168 hours. Lipid Profile: No results for input(s): CHOL, HDL, LDLCALC, TRIG, CHOLHDL, LDLDIRECT in the last 72 hours. Thyroid Function Tests: No results for input(s): TSH, T4TOTAL, FREET4, T3FREE, THYROIDAB in the last 72 hours. Anemia Panel: No results for input(s): VITAMINB12, FOLATE, FERRITIN, TIBC, IRON, RETICCTPCT in the last 72 hours. Urine analysis:    Component Value Date/Time   COLORURINE YELLOW 02/01/2010 2200   APPEARANCEUR CLEAR 02/01/2010 2200   LABSPEC 1.024 02/01/2010 2200   PHURINE 6.5 02/01/2010 2200   GLUCOSEU NEGATIVE 02/01/2010 2200   HGBUR NEGATIVE 02/01/2010 2200   BILIRUBINUR NEGATIVE 02/01/2010 2200   KETONESUR 15 (A) 02/01/2010 2200   PROTEINUR NEGATIVE 02/01/2010 2200   UROBILINOGEN 0.2 02/01/2010 2200   NITRITE NEGATIVE 02/01/2010 2200   LEUKOCYTESUR  02/01/2010 2200    NEGATIVE MICROSCOPIC NOT DONE ON URINES WITH NEGATIVE PROTEIN, BLOOD, LEUKOCYTES, NITRITE, OR GLUCOSE <1000 mg/dL.    Radiological Exams on Admission: I have personally reviewed images DG Chest 2 View Result Date: 07/01/2024 CLINICAL DATA:  Shortness of breath.  Cough. EXAM: CHEST - 2 VIEW COMPARISON:  06/17/2023 FINDINGS: Cardiomegaly is unchanged. Mediastinal contours are stable. Chronic peribronchial/interstitial thickening. No acute airspace disease. No pleural effusion. No pneumothorax. No acute osseous findings. IMPRESSION: Unchanged cardiomegaly. Chronic peribronchial/interstitial thickening. No acute findings. Electronically Signed   By: Andrea Gasman M.D.   On: 07/01/2024 21:10    EKG: My personal interpretation of EKG shows: Sinus tachycardia HR 104 with PVC  Assessment/Plan Principal Problem:   Acute on chronic systolic CHF (congestive heart failure) (HCC)   ##Acute on chronic HFrEF #Medication Non-compliance LVEF 25-30% with Grade II Diastolic dysfunction 07/2023. Patient endorses missing doses of  home meds  and pharmacy dispense report without recent dispenses. - s/p 80 mg IV lasix  in ED, continue IV Lasix  40 mg BID - ECHO - Strict I/Os - Check Lipid panel, HGB A1C, TSH - GDMT per Cardiology [facility] Cardiology consulted, appreciate their recommendations and management  ##Asthma Exacerbation - PO Prednisone  - Scheduled Pulmicort  - PRN Duonebs - Singulair   ##Hypokalemia - Received K in ED, 20 mEQ ordered TID - Replete PRN  #Class III Obesity Estimated body mass index is 48.77 kg/m as calculated from the following:   Height as of this encounter: 5' 1 (1.549 m).   Weight as of this encounter: 117.1 kg.    VTE prophylaxis:  Lovenox   GI prophylaxis: Not indicated  Diet: Heart Healthy Access: PIV Lines: None Code Status:  Full Code Telemetry: Yes  Admission status: Inpatient, Telemetry bed Patient is from: Home  Anticipated d/c is to: Home  Anticipated d/c date is: 2-3 days Patient currently: Receiving IV diuresis, titrating GDMT, on oral steroids  Family Communication: No family at bedside. Plan of care discussed with patient, questions elicited and welcomed. Questions answered to patient's expressed satisfaction.    Consults called: [facility]    Severity of Illness: The appropriate patient status for this patient is INPATIENT. Inpatient status is judged to be reasonable and necessary in order to provide the required intensity of service to ensure the patient's safety. The patient's presenting symptoms, physical exam findings, and initial radiographic and laboratory data in the context of their chronic comorbidities is felt to place them at high risk for further clinical deterioration. Furthermore, it is not anticipated that the patient will be medically stable for discharge from the hospital within 2 midnights of admission.   * I certify that at the point of admission it is my clinical judgment that the patient will require inpatient hospital care spanning  beyond 2 midnights from the point of admission due to high intensity of service, high risk for further deterioration and high frequency of surveillance required.*  To reach the provider On-Call:   7AM- 7PM see care teams to locate the attending and reach out to them via [email or web address] Password: [email or web address] 7PM-7AM contact night-coverage If you still have difficulty reaching the appropriate provider, please page the [facility] (Director on Call) for [facility] on amion for assistance  This document was prepared using Conservation officer, historic buildings and may include unintentional dictation errors.  Rockie Rams FNP-BC, PMHNP-BC Nurse Practitioner [facility]

## 2024-07-02 NOTE — ED Notes (Signed)
 Pt transported to MRI

## 2024-07-02 NOTE — Progress Notes (Signed)
 K came back 3.6 this PM on aggressive diuresis. Will replace with one more dose of KCL 40meq and check level in AM.  Sergio Batch, PharmD, BCIDP, AAHIVP, CPP Infectious Disease Pharmacist 07/02/2024 7:56 PM

## 2024-07-02 NOTE — Progress Notes (Signed)
 Echocardiogram 2D Echocardiogram has been performed.  Carolyn Oconnor 07/02/2024, 12:28 PM

## 2024-07-02 NOTE — ED Notes (Signed)
 Pt refusing to dress in a gown, states she is more comfortable in her clothes.

## 2024-07-02 NOTE — TOC CM/SW Note (Signed)
 Transition of Care [facility]) - Inpatient Brief Assessment   Patient Details  Name: Carolyn Oconnor MRN: [id number] Date of Birth: April 02, 1976  Transition of Care [facility]) CM/SW Contact:    Lauraine FORBES Saa, LCSWA Phone Number: 07/02/2024, 9:17 AM   Clinical Narrative:  9:17 AM Per chart review, patient resides at home with significant other. Patient has a PCP and insurance. Patient does not have SNF/HH/DME history. Patient's preferred pharmacy's are [facility], [facility], an [facility]. [facility] consult was placed for [facility] screen and SNF placement. TOC will continue to follow and be available to assist.  Transition of Care Asessment: Insurance and Status: Insurance coverage has been reviewed Patient has primary care physician: Yes Home environment has been reviewed: Private Residence Prior level of function:: N/A Prior/Current Home Services: No current home services Social Drivers of Health Review: SDOH reviewed no interventions necessary Readmission risk has been reviewed: Yes (Currently Observation Status) Transition of care needs: transition of care needs identified, TOC will continue to follow

## 2024-07-02 NOTE — Progress Notes (Signed)
 Pt arrived to unit with 5/10 CP, dull in nature, 12-lead EKG obtained, paper copy in chart. Pt was also wheezing & coughing. Janese Hurst, MD. See new orders.  Lonell LITTIE Lyme, RN

## 2024-07-02 NOTE — ED Provider Notes (Signed)
  EMERGENCY DEPARTMENT AT [facility] Provider Note   CSN: [id number] Arrival date & time: 07/01/24  8057     Patient presents with: Cough   Carolyn Oconnor is a 48 y.o. female.   The history is provided by the patient and medical records.  Cough Carolyn Oconnor is a 48 y.o. female who presents to the Emergency Department complaining of shortness of breath. She presents the emergency department for evaluation of difficulty breathing that is been present for the last two weeks, worsening over the last several days. She has a cough productive of mucus. She reports chest soreness with coughing otherwise no chest pain. Most of her shortness of breath is with activity, it resolves when she is at rest. No associated fever, abdominal pain, nausea, vomiting. She does report lower extremity edema, has a history of this that waxes and wanes. She did miss her evening torsemide . She works third shift and takes her torsemide  in the evening.       Prior to Admission medications   Medication Sig Start Date End Date Taking? Authorizing Provider  acetaminophen  (TYLENOL ) 325 MG tablet Take 2 tablets (650 mg total) by mouth every 6 (six) hours as needed for mild pain (or Fever >/= 101). 03/04/23   Sherrill Cable Latif, DO  albuterol  (PROVENTIL  HFA;VENTOLIN  HFA) 108 (90 Base) MCG/ACT inhaler Inhale 2 puffs into the lungs every 4 (four) hours as needed (cough and wheeze). For asthma attacks. 03/09/16   Noreen Tonnie BRAVO, MD  benzonatate  (TESSALON ) 100 MG capsule Take 1 capsule (100 mg total) by mouth every 8 (eight) hours. 06/17/23   Jarold Olam HERO, PA-C  bisoprolol  (ZEBETA ) 5 MG tablet Take 1/2 tablet (2.5 mg total) by mouth 2 (two) times daily. 03/12/23   Hayes Beckey LITTIE, NP  budesonide -formoterol  (SYMBICORT ) 80-4.5 MCG/ACT inhaler Inhale 2 puffs into the lungs 2 (two) times daily as needed.    [provider]  dapagliflozin  propanediol (FARXIGA ) 10 MG TABS tablet Take 1 tablet  (10 mg total) by mouth daily before breakfast. 08/22/23   Bensimhon, Toribio SAUNDERS, MD  digoxin  (LANOXIN ) 0.125 MG tablet Take 1 tablet (0.125 mg total) by mouth daily. 03/12/23   Hayes Beckey LITTIE, NP  fluticasone  (FLONASE ) 50 MCG/ACT nasal spray Place 2 sprays into both nostrils daily as needed for allergies or rhinitis.    [provider]  ivabradine  (CORLANOR ) 5 MG TABS tablet Take 1/2 tablet (2.5 mg total) by mouth 2 (two) times daily with a meal. 03/18/23   Bensimhon, Toribio SAUNDERS, MD  losartan  (COZAAR ) 25 MG tablet Take 1 tablet (25 mg total) by mouth daily. 11/21/23   Hayes Beckey LITTIE, NP  montelukast  (SINGULAIR ) 10 MG tablet Take 1 tablet (10 mg total) by mouth at bedtime. 03/09/16   Nestor, Jennings E, MD  ondansetron  (ZOFRAN -ODT) 4 MG disintegrating tablet Take 1 tablet (4 mg total) by mouth every 8 (eight) hours as needed for nausea. 06/17/23   Jarold Olam HERO, PA-C  potassium chloride  SA (KLOR-CON  M) 20 MEQ tablet Take 2 tablets (40 mEq total) by mouth daily. 01/31/24   Milford, Harlene HERO, FNP  spironolactone  (ALDACTONE ) 25 MG tablet Take 1 tablet (25 mg total) by mouth daily. 03/12/23   Hayes Beckey LITTIE, NP  torsemide  (DEMADEX ) 20 MG tablet Take 3 tablets (60 mg total) by mouth daily. 01/31/24   Glena Harlene HERO, FNP    Allergies: Aspirin     Review of Systems  Respiratory:  Positive for cough.  All other systems reviewed and are negative.   Updated Vital Signs BP 106/75 (BP Location: Left Arm)   Pulse 92   Temp (!) 97.4 F (36.3 C)   Resp 20   SpO2 94%   Physical Exam Vitals and nursing note reviewed.  Constitutional:      Appearance: She is well-developed.  HENT:     Head: Normocephalic and atraumatic.  Cardiovascular:     Rate and Rhythm: Normal rate and regular rhythm.     Heart sounds: No murmur heard. Pulmonary:     Effort: Pulmonary effort is normal. No respiratory distress.     Comments: Crackles in bilateral bases, tachypnea Abdominal:     Palpations: Abdomen is soft.      Tenderness: There is no abdominal tenderness. There is no guarding or rebound.  Musculoskeletal:        General: No tenderness.     Comments: Pitting edema to bilateral lower extremities  Skin:    General: Skin is warm and dry.  Neurological:     Mental Status: She is alert and oriented to person, place, and time.  Psychiatric:        Behavior: Behavior normal.     (all labs ordered are listed, but only abnormal results are displayed) Labs Reviewed  BASIC METABOLIC PANEL WITH GFR - Abnormal; Notable for the following components:      Result Value   Potassium 2.8 (*)    Glucose, Bld 100 (*)    Calcium 8.8 (*)    All other components within normal limits  BRAIN NATRIURETIC PEPTIDE - Abnormal; Notable for the following components:   B Natriuretic Peptide 929.8 (*)    All other components within normal limits  I-STAT CHEM 8, ED - Abnormal; Notable for the following components:   Potassium 2.7 (*)    Glucose, Bld 102 (*)    Calcium, Ion 1.09 (*)    All other components within normal limits  RESP PANEL BY RT-PCR (RSV, FLU A&B, COVID)  RVPGX2  CBC  HCG, SERUM, QUALITATIVE    EKG: EKG Interpretation Date/Time:  Wednesday July 01 2024 20:30:30 EDT Ventricular Rate:  105 PR Interval:  142 QRS Duration:  122 QT Interval:  368 QTC Calculation: 486 R Axis:   139  Text Interpretation: Sinus tachycardia with occasional Premature ventricular complexes Right axis deviation Non-specific intra-ventricular conduction delay ST & T wave abnormality, consider inferior ischemia Abnormal ECG Confirmed by Griselda Norris [phone]) on 07/02/2024 3:16:47 AM  Radiology: [facility] Chest 2 View Result Date: 07/01/2024 CLINICAL DATA:  Shortness of breath.  Cough. EXAM: CHEST - 2 VIEW COMPARISON:  06/17/2023 FINDINGS: Cardiomegaly is unchanged. Mediastinal contours are stable. Chronic peribronchial/interstitial thickening. No acute airspace disease. No pleural effusion. No pneumothorax. No acute osseous  findings. IMPRESSION: Unchanged cardiomegaly. Chronic peribronchial/interstitial thickening. No acute findings. Electronically Signed   By: Andrea Gasman M.D.   On: 07/01/2024 21:10     Procedures   Medications Ordered in the ED - No data to display                                  Medical Decision Making Amount and/or Complexity of Data Reviewed Labs: ordered. Radiology: ordered.  Risk Prescription drug management. Decision regarding hospitalization.   Patient with history of CHF here for evaluation of two weeks of progressive cough, shortness of breath and dyspnea on exertion. She does have crackles on examination, tachypnea.  She does have labored breathing on exertion. Labs are significant for hypokalemia. EKG is abnormal but similar when compared to priors. She does not have any active chest pain. Chest x-ray is negative for pneumonia, does demonstrate stable cardiomegaly. BNP is significantly elevated. She reports that her base weight is 243 pounds, today her weight is 159. Given significant symptoms will start on IV diuresis. Potassium is low, will replace orally. Current picture is not consistent with PE. Discussed with patient findings of studies and she is in agreement with admission. Hospitalist consulted for admission for ongoing care.     Final diagnoses:  Acute congestive heart failure, unspecified heart failure type [facility])    ED Discharge Orders     None          Griselda Norris, MD 07/02/24 [id number]

## 2024-07-02 NOTE — Telephone Encounter (Signed)
 Patient Product/process development scientist completed.    The patient is insured through [employer]. Patient has [employer], may use a copay card, and/or apply for patient assistance if available.    Ran test claim for Farxiga  10 mg and the current 30 day co-pay is $40.00.  Ran test claim for Jardiance 10 mg and Requires Prior Authorization  This test claim was processed through [employer]- copay amounts may vary at other pharmacies due to [employer], or as the patient moves through the different stages of their insurance plan.     Reyes Sharps, CPHT Pharmacy Technician III Certified Patient Advocate [facility] Patient Advocate Team Direct Number: [phone]  Fax: [phone]

## 2024-07-02 NOTE — ED Notes (Addendum)
 Pt ambulated 50 ft in hall, she became short of breath and oxygen saturation was 93%

## 2024-07-02 NOTE — Plan of Care (Signed)

## 2024-07-02 NOTE — ED Notes (Signed)
 Called floor to advise patient in route, they were receptive

## 2024-07-03 ENCOUNTER — Other Ambulatory Visit: Payer: Self-pay

## 2024-07-03 DIAGNOSIS — I5043 Acute on chronic combined systolic (congestive) and diastolic (congestive) heart failure: Secondary | ICD-10-CM | POA: Diagnosis not present

## 2024-07-03 LAB — COOXEMETRY PANEL
Carboxyhemoglobin: 1.4 % (ref 0.5–1.5)
Carboxyhemoglobin: 1.8 % — ABNORMAL HIGH (ref 0.5–1.5)
Methemoglobin: 0.8 % (ref 0.0–1.5)
Methemoglobin: <0.7 % (ref 0.0–1.5)
O2 Saturation: 68.5 %
O2 Saturation: 65.9 %
Total hemoglobin: 14.8 g/dL (ref 12.0–16.0)
Total hemoglobin: 14.9 g/dL (ref 12.0–16.0)

## 2024-07-03 LAB — BASIC METABOLIC PANEL WITH GFR
Anion gap: 11 (ref 5–15)
Anion gap: 15 (ref 5–15)
BUN: 14 mg/dL (ref 6–20)
BUN: 17 mg/dL (ref 6–20)
CO2: 22 mmol/L (ref 22–32)
CO2: 25 mmol/L (ref 22–32)
Calcium: 9.2 mg/dL (ref 8.9–10.3)
Calcium: 9.4 mg/dL (ref 8.9–10.3)
Chloride: 102 mmol/L (ref 98–111)
Chloride: 96 mmol/L — ABNORMAL LOW (ref 98–111)
Creatinine, Ser: 1.08 mg/dL — ABNORMAL HIGH (ref 0.44–1.00)
Creatinine, Ser: 1.21 mg/dL — ABNORMAL HIGH (ref 0.44–1.00)
GFR, Estimated: >60 mL/min (ref >60)
GFR, Estimated: 56 mL/min — ABNORMAL LOW (ref >60)
Glucose, Bld: 123 mg/dL — ABNORMAL HIGH (ref 70–99)
Glucose, Bld: 131 mg/dL — ABNORMAL HIGH (ref 70–99)
Potassium: 3.9 mmol/L (ref 3.5–5.1)
Potassium: 4 mmol/L (ref 3.5–5.1)
Sodium: 135 mmol/L (ref 135–145)
Sodium: 136 mmol/L (ref 135–145)

## 2024-07-03 LAB — LIPID PANEL
Cholesterol: 133 mg/dL (ref 0–200)
HDL: 28 mg/dL — ABNORMAL LOW (ref >40)
LDL Cholesterol: 94 mg/dL (ref 0–99)
Total CHOL/HDL Ratio: 4.8 ratio
Triglycerides: 57 mg/dL (ref <150)
VLDL: 11 mg/dL (ref 0–40)

## 2024-07-03 LAB — CBC
HCT: 43.6 % (ref 36.0–46.0)
Hemoglobin: 14.1 g/dL (ref 12.0–15.0)
MCH: 27.2 pg (ref 26.0–34.0)
MCHC: 32.3 g/dL (ref 30.0–36.0)
MCV: 84.2 fL (ref 80.0–100.0)
Platelets: 282 K/uL (ref 150–400)
RBC: 5.18 MIL/uL — ABNORMAL HIGH (ref 3.87–5.11)
RDW: 15.2 % (ref 11.5–15.5)
WBC: 3.3 K/uL — ABNORMAL LOW (ref 4.0–10.5)
nRBC: 0 % (ref 0.0–0.2)

## 2024-07-03 LAB — HEMOGLOBIN A1C
Hgb A1c MFr Bld: 6.1 % — ABNORMAL HIGH (ref 4.8–5.6)
Mean Plasma Glucose: 128.37 mg/dL

## 2024-07-03 LAB — MAGNESIUM
Magnesium: 2.4 mg/dL (ref 1.7–2.4)
Magnesium: 2.2 mg/dL (ref 1.7–2.4)

## 2024-07-03 LAB — TSH: TSH: 1.363 u[IU]/mL (ref 0.350–4.500)

## 2024-07-03 LAB — BRAIN NATRIURETIC PEPTIDE: B Natriuretic Peptide: 1495.4 pg/mL — ABNORMAL HIGH (ref 0.0–100.0)

## 2024-07-03 MED ORDER — CHLORHEXIDINE GLUCONATE CLOTH 2 % EX PADS
6.0000 | MEDICATED_PAD | Freq: Every day | CUTANEOUS | Status: DC
  Administered 2024-07-03 – 2024-07-07 (×5): 6 via TOPICAL

## 2024-07-03 MED ORDER — BACLOFEN 10 MG PO TABS
5.0000 mg | ORAL_TABLET | Freq: Two times a day (BID) | ORAL | Status: DC | PRN
  Administered 2024-07-03 – 2024-07-04 (×2): 5 mg via ORAL
  Filled 2024-07-03 (×2): qty 1

## 2024-07-03 MED ORDER — FUROSEMIDE 10 MG/ML IJ SOLN
20.0000 mg/h | INTRAVENOUS | Status: DC
  Administered 2024-07-03 – 2024-07-05 (×5): 20 mg/h via INTRAVENOUS
  Filled 2024-07-03 (×6): qty 20

## 2024-07-03 MED ORDER — ENOXAPARIN SODIUM 60 MG/0.6ML IJ SOSY
60.0000 mg | PREFILLED_SYRINGE | INTRAMUSCULAR | Status: DC
  Administered 2024-07-03 – 2024-07-07 (×5): 60 mg via SUBCUTANEOUS
  Filled 2024-07-03 (×5): qty 0.6

## 2024-07-03 MED ORDER — SODIUM CHLORIDE 0.9% FLUSH: 10.0000 mL | INTRAVENOUS | Status: DC | PRN

## 2024-07-03 MED ORDER — SODIUM CHLORIDE 0.9% FLUSH
10.0000 mL | Freq: Two times a day (BID) | INTRAVENOUS | Status: DC
  Administered 2024-07-03 – 2024-07-04 (×4): 10 mL
  Administered 2024-07-05: 20 mL
  Administered 2024-07-05 – 2024-07-07 (×4): 10 mL

## 2024-07-03 MED ORDER — POTASSIUM CHLORIDE CRYS ER 20 MEQ PO TBCR
40.0000 meq | EXTENDED_RELEASE_TABLET | Freq: Once | ORAL | Status: AC
  Administered 2024-07-03: 40 meq via ORAL
  Filled 2024-07-03: qty 2

## 2024-07-03 MED ORDER — METOLAZONE 2.5 MG PO TABS
2.5000 mg | ORAL_TABLET | Freq: Once | ORAL | Status: AC
  Administered 2024-07-03: 2.5 mg via ORAL
  Filled 2024-07-03: qty 1

## 2024-07-03 MED ORDER — BENZONATATE 100 MG PO CAPS
100.0000 mg | ORAL_CAPSULE | Freq: Three times a day (TID) | ORAL | Status: DC | PRN
  Administered 2024-07-03 – 2024-07-04 (×2): 100 mg via ORAL
  Filled 2024-07-03 (×2): qty 1

## 2024-07-03 NOTE — Progress Notes (Signed)
 Orthopedic Tech Progress Note Patient Details:  Carolyn Oconnor May 13, 1976 [id number] Applied unna boots per order.  Ortho Devices Type of Ortho Device: Ace wrap, Unna boot Ortho Device/Splint Location: BLE Ortho Device/Splint Interventions: Ordered, Application   Post Interventions Patient Tolerated: Well Instructions Provided: Adjustment of device, Care of device, Poper ambulation with device  Morna Pink 07/03/2024, 3:49 PM

## 2024-07-03 NOTE — Progress Notes (Signed)
 PROGRESS NOTE  Harlene LITTIE Calk  DOB: 1976/04/24  PCP: Phyllis Jereld BROCKS, NP FMW:[id number]  DOA: 07/01/2024  LOS: 1 day  Hospital Day: 3  Brief narrative: Carolyn Oconnor is a 48 y.o. female with PMH significant for morbid obesity, asthma, chronic systolic CHF.  Patient has systolic CHF diagnosed since April 2024.  She has been hospitalized couple of times since then for CHF exacerbation.  Most recent echo from October 2024 had shown EF of 25 to 30% with grade 2 diastolic dysfunction.  Last seen at heart failure clinic on April 2025 for worsening symptoms.  Torsemide  dose was increased and repeat echo was ordered.  But unfortunately, she lost to follow-up appointment.  Apparently has not been compliant to her medicines as well.  Works as a Architect.  9/3, patient presented to the ED with complaint of progressive dyspnea, productive cough, progressive bilateral lower extremity edema over the past 2 weeks.  In the ED, patient was afebrile, heart rate in 90s and 100s, blood pressure in low 100s, breathing room air Labs with creatinine in normal range, potassium low at 3.1, CBC unremarkable Chest x-ray showed chronic peribronchial interstitial thickening. Patient was started on IV Lasix  Admitted to [facility] CHF service was consulted  Subjective: Patient was seen and examined this morning. Middle-aged African-American female. Sitting up at the edge of the bed.  She was short of breath on trying to complete full sentence.  She is very anxious learning about the echo from yesterday showing EF of less than 20%. Chart reviewed In the last 24 hours, afebrile, heart rate in 100s, blood pressure normal range, breathing on room air Last set of labs from this morning with creatinine 1.08, potassium 3.9, BNP elevated to 1495  Assessment and plan: Acute exacerbation of chronic systolic CHF Presented with progressive dyspnea, bilateral lower extremity edema, cough in the setting of systolic CHF  and noncompliance to meds/follow-up. Heart failure service involved.   Echocardiogram 9/4 showed drop in EF down to less than 20% with severely decreased LV function, global hypokinesis, moderately dilated cavity size, grade 2 diastolic dysfunction, severely reduced RV systolic function, moderately enlarged RV size, mildly elevated pulm artery pressure to 44 Currently on IV Lasix  Defer to heart failure service for GDMT: Currently on digoxin , IV Lasix , Aldactone , Farxiga . Noted a plan from cardiology today to potentially start on milrinone drip. Continue to monitor for daily intake output, weight, blood pressure, BNP, renal function and electrolytes. Net IO Since Admission: -990 mL [07/03/24 1146] Recent Labs  Lab 07/01/24 2030 07/01/24 2035 07/02/24 0410 07/02/24 0726 07/02/24 1854 07/03/24 0348  BNP 929.8*  --   --   --   --  1,495.4*  BUN 14 13  --  9 14 14   CREATININE 0.85 0.90  --  0.96 1.38* 1.08*  NA 136 138  --  137 136 135  K 2.8* 2.7*  --  3.1* 3.6 3.9  MG  --   --  2.1 1.9  --  2.4   Hypokalemia Potassium level was low on admission.  Improved with replacement.  AKI Creatinine was elevated on 9/4 due to diuresis.  Better today.  Continue to monitor  H/o asthma Continue bronchodilators, Singulair  Was started on prednisone  60 mg daily on admission.  To avoid volume retention, I would stop prednisone .  Morbid Obesity  Body mass index is 48.32 kg/m. Patient has been advised to make an attempt to improve diet and exercise patterns to aid in weight loss.  Mobility: Encourage ambulation  Goals of care   Code Status: Full Code     DVT prophylaxis:  Place TED hose Start: 07/02/24 1102   Antimicrobials: None Fluid: None Consultants: CHF service Family Communication: None at bedside  Status: Inpatient Level of care:  Telemetry Cardiac   Patient is from: Home Needs to continue in-hospital care: Ongoing workup for CHF Anticipated d/c to: Hopefully home  ultimately   Diet:  Diet Order             Diet Heart Room service appropriate? Yes; Fluid consistency: Thin  Diet effective now                   Scheduled Meds:  budesonide  (PULMICORT ) nebulizer solution  0.25 mg Nebulization BID   Chlorhexidine  Gluconate Cloth  6 each Topical Daily   dapagliflozin  propanediol  10 mg Oral Daily   digoxin   0.125 mg Oral Daily   enoxaparin  (LOVENOX ) injection  60 mg Subcutaneous Q24H   guaiFENesin   600 mg Oral BID   influenza vac split trivalent PF  0.5 mL Intramuscular Tomorrow-1000   montelukast   10 mg Oral QHS   pneumococcal 20-valent conjugate vaccine  0.5 mL Intramuscular Tomorrow-1000   sodium chloride  flush  10-40 mL Intracatheter Q12H   spironolactone   25 mg Oral Daily    PRN meds: acetaminophen  **OR** acetaminophen , benzonatate , ipratropium-albuterol , melatonin, morphine  injection, polyethylene glycol, prochlorperazine , sodium chloride  flush   Infusions:   furosemide  (LASIX ) 200 mg in dextrose  5 % 100 mL (2 mg/mL) infusion      Antimicrobials: Anti-infectives (From admission, onward)    None       Objective: Vitals:   07/03/24 0813 07/03/24 1122  BP:  108/72  Pulse: (!) 104   Resp: 20   Temp:  97.9 F (36.6 C)  SpO2: 97% 95%    Intake/Output Summary (Last 24 hours) at 07/03/2024 1146 Last data filed at 07/03/2024 0735 Gross per 24 hour  Intake 770 ml  Output 1100 ml  Net -330 ml   Filed Weights   07/02/24 0350 07/02/24 0533 07/03/24 0438  Weight: 117.9 kg 117.1 kg 116 kg   Weight change: -1.89 kg Body mass index is 48.32 kg/m.   Physical Exam: General exam: Pleasant, middle-aged African-American female.  Morbidly obese Skin: No rashes, lesions or ulcers. HEENT: Atraumatic, normocephalic, no obvious bleeding Lungs: Clear to auscultation bilaterally,  CVS: S1, S2, no murmur,   GI/Abd: Soft, nontender, nondistended, bowel sound present,   CNS: Alert, awake, oriented x 3 Psychiatry: Sad  affect Extremities: Trace to 1+ bilateral pedal edema, no calf tenderness  Data Review: I have personally reviewed the laboratory data and studies available.  F/u labs ordered Unresulted Labs (From admission, onward)     Start     Ordered   07/04/24 0500  Basic metabolic panel  Daily,   R     Question:  Specimen collection method  Answer:  Lab=Lab collect   07/03/24 0818   07/03/24 0500  Magnesium   Daily,   R     Question:  Specimen collection method  Answer:  Lab=Lab collect   07/02/24 1055            Signed, Chapman Rota, MD [facility] 07/03/2024

## 2024-07-03 NOTE — TOC Initial Note (Addendum)
 Transition of Care [facility]) - Initial/Assessment Note    Patient Details  Name: Carolyn Oconnor MRN: [id number] Date of Birth: 1975/11/04  Transition of Care [facility]) CM/SW Contact:    Justina Delcia Czar, RN Phone Number: [phone] 07/03/2024, 2:34 PM  Clinical Narrative:                 Spoke to pt and states she works full-time. Pt reports she will need note for work. Pt reports she has scale at home for daily. Education was completed on diet, low sodium/heart healthy. Pt states they do eat out frequently. Pt agreeable to seeing dietician. Message sent to attending.   Provided pt with Living better with HF booklet.   Boyfriend takes pt to appts.   Will continue to follow for dc needs.    Expected Discharge Plan: Home/Self Care Barriers to Discharge: Continued Medical Work up   Patient Goals and CMS Choice Patient states their goals for this hospitalization and ongoing recovery are:: wants to get better    Expected Discharge Plan and Services   Discharge Planning Services: CM Consult   Living arrangements for the past 2 months: Apartment   Prior Living Arrangements/Services Living arrangements for the past 2 months: Apartment Lives with:: Significant Other Patient language and need for interpreter reviewed:: Yes Do you feel safe going back to the place where you live?: Yes      Need for Family Participation in Patient Care: No (Comment) Care giver support system in place?: Yes (comment) Current home services: DME (scale) Criminal Activity/Legal Involvement Pertinent to Current Situation/Hospitalization: No - Comment as needed  Activities of Daily Living   ADL Screening (condition at time of admission) Independently performs ADLs?: Yes (appropriate for developmental age) Is the patient deaf or have difficulty hearing?: No Does the patient have difficulty seeing, even when wearing glasses/contacts?: No Does the patient have difficulty concentrating, remembering, or  making decisions?: No  Permission Sought/Granted Permission sought to share information with : Case Manager, PCP, Family Supports Permission granted to share information with : Yes, Verbal Permission Granted  Share Information with NAME: Celenia Hruska  Permission granted to share info w AGENCY: PCP  Permission granted to share info w Relationship: sister  Permission granted to share info w Contact Information: [phone]  Emotional Assessment Appearance:: Appears stated age Attitude/Demeanor/Rapport: Engaged Affect (typically observed): Accepting Orientation: : Oriented to Self, Oriented to Place, Oriented to  Time, Oriented to Situation   Psych Involvement: No (comment)  Admission diagnosis:  Acute on chronic systolic CHF (congestive heart failure) (HCC) [I50.23] Patient Active Problem List   Diagnosis Date Noted   Acute on chronic systolic CHF (congestive heart failure) (HCC) 07/02/2024   Chronic systolic heart failure (HCC) 03/12/2023   AKI (acute kidney injury) (HCC) 03/05/2023   Obesity, Class III, BMI 40-49.9 (morbid obesity) 03/05/2023   Acute systolic congestive heart failure (HCC) 02/27/2023   Acute asthma exacerbation 02/26/2023   Severe persistent asthma 03/09/2016   Chronic bronchitis (HCC) 03/09/2016   Allergic rhinitis 03/09/2016   PCP:  Phyllis Jereld BROCKS, NP Pharmacy:   [facility] [address] [place] [address] Phone: [phone] Fax: [phone]  [facility] [place], [place] - [address] [place] [address] Phone: [phone] Fax: [phone]  [facility] [address] [place] [address] Phone: [phone] Fax: [phone]     Social Drivers of  Health (SDOH) Social History: SDOH Screenings   Food Insecurity: No Food Insecurity (07/02/2024)  Housing: Low Risk  (07/02/2024)  Transportation Needs: No Transportation Needs  (07/02/2024)  Utilities: Not At Risk (07/02/2024)  Alcohol Screen: Low Risk  (06/10/2024)  Depression (PHQ2-9): Low Risk  (04/12/2023)  Financial Resource Strain: Low Risk  (06/10/2024)  Physical Activity: Sufficiently Active (06/10/2024)  Social Connections: Moderately Isolated (06/10/2024)  Stress: Stress Concern Present (06/10/2024)  Tobacco Use: Medium Risk (07/01/2024)   SDOH Interventions:     Readmission Risk Interventions     No data to display

## 2024-07-03 NOTE — Progress Notes (Addendum)
 Advanced Heart Failure Rounding Note  Cardiologist: None  Chief Complaint: CHF  Subjective:    2L UOP (net -1.2L). Weight down 3lbs.  BP stable overnight. HR in low 100s Cr 0.96>1.38>1.08  Sitting on EOB. More awake this morning. Feeling okay, continues with hacking cough. Has been walking with PT. Wearing compression stockings.   Objective:    Weight Range: 116 kg Body mass index is 48.32 kg/m.   Vital Signs:   Temp:  [97.4 F (36.3 C)-98.6 F (37 C)] 97.8 F (36.6 C) (09/05 0730) Pulse Rate:  [87-104] 104 (09/05 0813) Resp:  [18-20] 20 (09/05 0813) BP: (94-114)/(64-89) 112/85 (09/05 0730) SpO2:  [93 %-98 %] 97 % (09/05 0813) Weight:  [883 kg] 116 kg (09/05 0438) Last BM Date : 07/01/24  Weight change: Filed Weights   07/02/24 0350 07/02/24 0533 07/03/24 0438  Weight: 117.9 kg 117.1 kg 116 kg   Intake/Output:  Intake/Output Summary (Last 24 hours) at 07/03/2024 0815 Last data filed at 07/03/2024 0735 Gross per 24 hour  Intake 1010 ml  Output 1100 ml  Net -90 ml    Physical Exam    General: Well appearing. No distress on RA Cardiac: JVP ~10cm. S1 and S2 present. No murmurs or rub. Resp: Lung sounds coarse Abdomen: Soft, non-tender, non-distended.  Extremities: Cool and dry.  1+ BLE edema.  Neuro: Alert and oriented x3. Affect pleasant. Moves all extremities without difficulty.  Telemetry   ST low 100s (personally reviewed)  Labs    CBC Recent Labs    07/02/24 0726 07/03/24 0348  WBC 5.8 3.3*  HGB 13.9 14.1  HCT 42.8 43.6  MCV 84.3 84.2  PLT 324 282   Basic Metabolic Panel Recent Labs    90/95/74 0726 07/02/24 1854 07/03/24 0348  NA 137 136 135  K 3.1* 3.6 3.9  CL 100 99 102  CO2 20* 20* 22  GLUCOSE 127* 147* 123*  BUN 9 14 14   CREATININE 0.96 1.38* 1.08*  CALCIUM 9.0 9.5 9.2  MG 1.9  --  2.4   BNP (last 3 results) Recent Labs    01/31/24 1225 07/01/24 2030 07/03/24 0348  BNP 819.2* 929.8* 1,495.4*   Hemoglobin A1C Recent  Labs    07/03/24 0348  HGBA1C 6.1*   Fasting Lipid Panel Recent Labs    07/03/24 0348  CHOL 133  HDL 28*  LDLCALC 94  TRIG 57  CHOLHDL 4.8   Thyroid Function Tests Recent Labs    07/03/24 0348  TSH 1.363   Medications:     Scheduled Medications:  budesonide  (PULMICORT ) nebulizer solution  0.25 mg Nebulization BID   dapagliflozin  propanediol  10 mg Oral Daily   digoxin   0.125 mg Oral Daily   enoxaparin  (LOVENOX ) injection  60 mg Subcutaneous Q24H   furosemide   80 mg Intravenous BID   guaiFENesin   600 mg Oral BID   influenza vac split trivalent PF  0.5 mL Intramuscular Tomorrow-1000   montelukast   10 mg Oral QHS   pneumococcal 20-valent conjugate vaccine  0.5 mL Intramuscular Tomorrow-1000   predniSONE   60 mg Oral Q breakfast   spironolactone   25 mg Oral Daily    Infusions:   PRN Medications: acetaminophen  **OR** acetaminophen , ipratropium-albuterol , melatonin, morphine  injection, polyethylene glycol, prochlorperazine   Patient Profile   Carolyn Oconnor is a 48 y.o. AAF w/ h/o asthma, obesity and systolic heart failure.   Assessment/Plan   1. Acute on Chronic Systolic Heart Failure, NICM - Diagnosed in 4/24 - Echo 4/24:  EF 20-25% (looks closer to 30-35%), RV hyperdynamic  - R/LHC 5/24: no CAD, RA 13, PA 59/42 (48), PCW 33, LVEDP 35. Fick CO 6.13, CI 2.87. TD CO/CI: 6.3/2.9. PAPi 1.3 - cMRI 5/24: LVEF 25%, RVEF 42%. NICM.  - Echo 10/24 EF 30-35% RV ok (limited images)  - Echo this admission with EF <20%, G2DD and severe RV dysfunction, RSVP 44 - Volume difficult to assess with body habitus, cold and dry this morning. Place PICC line for CVP/Co-ox monitoring - Marginal response to Lasix  yesterday. Continue Lasix  80 mg bid. Give metolazone  2.5 mg daily - Continue spiro 25 mg daily  - Continue digoxin  0.125 mg daily  - Continue Farxiga  10 mg daily - Hold on ? blocker with concern for low output/hypervolemia - BP too low for ARB   2. Morbid obesity - Body mass  index is 48.32 kg/m. - Has seen PharmD for GLP1RA. A1C 6.1   3. H/o NSVT - occasional PVCs on tele   4. Suspect OSA/OHS - sleep study ordered, has not completed - may need PFTs in outpatient  5. Asthma/Cough - CXR 9/3 with chronic peribronchial/interstitial thickening and pulmonary edema - continue diuresis as above - PRN tesslon pearls  Length of Stay: 1  Carolyn Lee, NP  07/03/2024, 8:15 AM  Advanced Heart Failure Team Pager [phone] (M-F; 7a - 5p)  Please contact [facility] for night-coverage after hours (5p -7a ) and weekends on [email or web address]  Patient seen and examined with the above-signed Advanced Practice Provider and/or Housestaff. I personally reviewed laboratory data, imaging studies and relevant notes. I independently examined the patient and formulated the important aspects of the plan. I have edited the note to reflect any of my changes or salient points. I have personally discussed the plan with the patient and/or family.  She had modest diuresis with IV Lasix  overnight.  This morning she is more short of breath and orthopneic.  Echo done yesterday shows EF less than 20% grade 2 diastolic dysfunction and severe RV dysfunction.  She remains tachycardic with a heart rate in the 120-130 range.  Obese woman sitting straight up in bed.  Short of breath with talking. HEENT normal Neck supple and thick.  Hard to see JVD. Cardiac regular tachycardic plus S3. Lungs clear. Abdomen obese nontender nondistended good bowel sound Extremities are cool with 1-2+ edema.  No rash. Neuro cranial nerves are intact she is nonfocal.  She moves all 4  I am very concerned about her today.  Echo shows significantly worsened biventricular failure.  She remains significantly volume overloaded and markedly symptomatic.  She is somewhat cool on exam and concerning for low output particular in the setting of her profound sinus tachycardia.  Will place PICC line and check CVP and Co. ox.  Low  threshold to start inotropic support and/or moved to the ICU.  Will increase her diuretic regimen.  Titrate GDMT as tolerated.  Unfortunately given her size and noncompliance that she would not be a transplant candidate at this point.  She would not qualify for LVAD due to RV dysfunction and her size.  Carolyn Fuel, MD  10:45 AM

## 2024-07-03 NOTE — Plan of Care (Signed)

## 2024-07-03 NOTE — Progress Notes (Signed)
 Peripherally Inserted Central Catheter Placement  The IV Nurse has discussed with the patient and/or persons authorized to consent for the patient, the purpose of this procedure and the potential benefits and risks involved with this procedure.  The benefits include less needle sticks, lab draws from the catheter, and the patient may be discharged home with the catheter. Risks include, but not limited to, infection, bleeding, blood clot (thrombus formation), and puncture of an artery; nerve damage and irregular heartbeat and possibility to perform a PICC exchange if needed/ordered by physician.  Alternatives to this procedure were also discussed.  Bard Power PICC patient education guide, fact sheet on infection prevention and patient information card has been provided to patient /or left at bedside.    PICC Placement Documentation  PICC Double Lumen 07/03/24 Right Brachial 38 cm 1 cm (Active)  Indication for Insertion or Continuance of Line Vasoactive infusions 07/03/24 0930  Exposed Catheter (cm) 1 cm 07/03/24 0930  Site Assessment Clean, Dry, Intact 07/03/24 0930  Lumen #1 Status Flushed;Saline locked;Blood return noted 07/03/24 0930  Lumen #2 Status Flushed;Saline locked;Blood return noted 07/03/24 0930  Dressing Type Securing device;Transparent 07/03/24 0930  Dressing Status Antimicrobial disc/dressing in place;Clean, Dry, Intact 07/03/24 0930  Line Care Connections checked and tightened 07/03/24 0930  Line Adjustment (NICU/IV Team Only) No 07/03/24 0930  Dressing Intervention New dressing;Adhesive placed at insertion site (IV team only) 07/03/24 0930  Dressing Change Due 07/10/24 07/03/24 0930       Renaee Notice Albarece 07/03/2024, 9:31 AM

## 2024-07-04 DIAGNOSIS — I5082 Biventricular heart failure: Secondary | ICD-10-CM

## 2024-07-04 DIAGNOSIS — I5043 Acute on chronic combined systolic (congestive) and diastolic (congestive) heart failure: Secondary | ICD-10-CM | POA: Diagnosis not present

## 2024-07-04 DIAGNOSIS — I1310 Hypertensive heart and chronic kidney disease without heart failure, with stage 1 through stage 4 chronic kidney disease, or unspecified chronic kidney disease: Secondary | ICD-10-CM

## 2024-07-04 DIAGNOSIS — I130 Hypertensive heart and chronic kidney disease with heart failure and stage 1 through stage 4 chronic kidney disease, or unspecified chronic kidney disease: Secondary | ICD-10-CM

## 2024-07-04 LAB — BASIC METABOLIC PANEL WITH GFR
Anion gap: 13 (ref 5–15)
BUN: 18 mg/dL (ref 6–20)
CO2: 30 mmol/L (ref 22–32)
Calcium: 9 mg/dL (ref 8.9–10.3)
Chloride: 93 mmol/L — ABNORMAL LOW (ref 98–111)
Creatinine, Ser: 0.95 mg/dL (ref 0.44–1.00)
GFR, Estimated: >60 mL/min (ref >60)
Glucose, Bld: 133 mg/dL — ABNORMAL HIGH (ref 70–99)
Potassium: 3.1 mmol/L — ABNORMAL LOW (ref 3.5–5.1)
Sodium: 136 mmol/L (ref 135–145)

## 2024-07-04 LAB — CBC
HCT: 43.7 % (ref 36.0–46.0)
Hemoglobin: 14.3 g/dL (ref 12.0–15.0)
MCH: 27.3 pg (ref 26.0–34.0)
MCHC: 32.7 g/dL (ref 30.0–36.0)
MCV: 83.6 fL (ref 80.0–100.0)
Platelets: 312 K/uL (ref 150–400)
RBC: 5.23 MIL/uL — ABNORMAL HIGH (ref 3.87–5.11)
RDW: 14.9 % (ref 11.5–15.5)
WBC: 10 K/uL (ref 4.0–10.5)
nRBC: 0 % (ref 0.0–0.2)

## 2024-07-04 LAB — COOXEMETRY PANEL
Carboxyhemoglobin: 1.2 % (ref 0.5–1.5)
Methemoglobin: <0.7 % (ref 0.0–1.5)
O2 Saturation: 66.3 %
Total hemoglobin: 15 g/dL (ref 12.0–16.0)

## 2024-07-04 LAB — MAGNESIUM: Magnesium: 2 mg/dL (ref 1.7–2.4)

## 2024-07-04 MED ORDER — POTASSIUM CHLORIDE CRYS ER 20 MEQ PO TBCR
40.0000 meq | EXTENDED_RELEASE_TABLET | Freq: Once | ORAL | Status: AC
  Administered 2024-07-04: 40 meq via ORAL
  Filled 2024-07-04: qty 2

## 2024-07-04 MED ORDER — POTASSIUM CHLORIDE CRYS ER 20 MEQ PO TBCR
40.0000 meq | EXTENDED_RELEASE_TABLET | Freq: Two times a day (BID) | ORAL | Status: AC
  Administered 2024-07-04 – 2024-07-05 (×5): 40 meq via ORAL
  Filled 2024-07-04 (×5): qty 2

## 2024-07-04 MED ORDER — POTASSIUM CHLORIDE CRYS ER 20 MEQ PO TBCR
30.0000 meq | EXTENDED_RELEASE_TABLET | ORAL | Status: DC
  Administered 2024-07-04: 30 meq via ORAL
  Filled 2024-07-04: qty 1

## 2024-07-04 NOTE — Progress Notes (Signed)
 PROGRESS NOTE  Carolyn Oconnor  DOB: 19-Jan-1976  PCP: Phyllis Jereld BROCKS, NP FMW:[id number]  DOA: 07/01/2024  LOS: 2 days  Hospital Day: 4  Brief narrative: Carolyn Oconnor is a 48 y.o. female with PMH significant for morbid obesity, asthma, chronic systolic CHF.  Patient has systolic CHF diagnosed since April 2024.  She has been hospitalized couple of times since then for CHF exacerbation.  Most recent echo from October 2024 had shown EF of 25 to 30% with grade 2 diastolic dysfunction.  Last seen at heart failure clinic on April 2025 for worsening symptoms.  Torsemide  dose was increased and repeat echo was ordered.  But unfortunately, she lost to follow-up appointment.  Apparently has not been compliant to her medicines as well.  Works as a Architect.  9/3, patient presented to the ED with complaint of progressive dyspnea, productive cough, progressive bilateral lower extremity edema over the past 2 weeks.  In the ED, patient was afebrile, heart rate in 90s and 100s, blood pressure in low 100s, breathing room air Labs with creatinine in normal range, potassium low at 3.1, CBC unremarkable Chest x-ray showed chronic peribronchial interstitial thickening. Patient was started on IV Lasix  Admitted to [facility] CHF service was consulted  Subjective: Patient was seen and examined this morning. Sitting up at the edge of the bed.  Not in distress. Breathing feels better than yesterday. Diuresed more than 8.6 L with Lasix  drip in the last 24 hours. CHF service following  Assessment and plan: Acute exacerbation of chronic systolic CHF Presented with progressive dyspnea, bilateral lower extremity edema, cough in the setting of systolic CHF and noncompliance to meds/follow-up. Heart failure service involved.   Echocardiogram 9/4 showed drop in EF down to less than 20% with severely decreased LV function, global hypokinesis, moderately dilated cavity size, grade 2 diastolic dysfunction,  severely reduced RV systolic function, moderately enlarged RV size, mildly elevated pulm artery pressure to 44 Defer to heart failure service for GDMT: Currently on IV Lasix  drip along with digoxin , Aldactone , Farxiga . Noted more than 8.6 L of dialysis in the last 24 hours. Continue to monitor for daily intake output, weight, blood pressure, BNP, renal function and electrolytes. Net IO Since Admission: -9,256.35 mL [07/04/24 1353] Recent Labs  Lab 07/01/24 2030 07/01/24 2035 07/02/24 0410 07/02/24 0726 07/02/24 1854 07/03/24 0348 07/03/24 1521 07/04/24 0452  BNP 929.8*  --   --   --   --  1,495.4*  --   --   BUN 14   < >  --  9 14 14 17 18   CREATININE 0.85   < >  --  0.96 1.38* 1.08* 1.21* 0.95  NA 136   < >  --  137 136 135 136 136  K 2.8*   < >  --  3.1* 3.6 3.9 4.0 3.1*  MG  --   --  2.1 1.9  --  2.4 2.2 2.0   < > = values in this interval not displayed.   Hypokalemia Potassium level low at 3.1 this morning.  Replacement given  AKI Creatinine was elevated on 9/4 due to diuresis.  Improved with diuresis.  Normal creatinine today  Muscle cramps Complaint of muscle cramps yesterday.  Potassium and magnesium  level being followed.  Replacement as above  H/o asthma Continue bronchodilators, Singulair  Was started on prednisone  60 mg daily on admission.  To avoid volume retention, I stopped prednisone  on 9/5  Morbid Obesity  Body mass index is 45.86 kg/m.  Patient has been advised to make an attempt to improve diet and exercise patterns to aid in weight loss.    Mobility: Encourage ambulation  Goals of care   Code Status: Full Code     DVT prophylaxis:  Place TED hose Start: 07/02/24 1102   Antimicrobials: None Fluid: None Consultants: CHF service Family Communication: None at bedside  Status: Inpatient Level of care:  Telemetry Cardiac   Patient is from: Home Needs to continue in-hospital care: Ongoing workup for CHF Anticipated d/c to: Hopefully home  ultimately   Diet:  Diet Order             Diet Heart Room service appropriate? Yes; Fluid consistency: Thin  Diet effective now                   Scheduled Meds:  budesonide  (PULMICORT ) nebulizer solution  0.25 mg Nebulization BID   Chlorhexidine  Gluconate Cloth  6 each Topical Daily   dapagliflozin  propanediol  10 mg Oral Daily   digoxin   0.125 mg Oral Daily   enoxaparin  (LOVENOX ) injection  60 mg Subcutaneous Q24H   guaiFENesin   600 mg Oral BID   montelukast   10 mg Oral QHS   potassium chloride   40 mEq Oral BID   sodium chloride  flush  10-40 mL Intracatheter Q12H   spironolactone   25 mg Oral Daily    PRN meds: acetaminophen  **OR** acetaminophen , baclofen , benzonatate , ipratropium-albuterol , melatonin, morphine  injection, polyethylene glycol, prochlorperazine , sodium chloride  flush   Infusions:   furosemide  (LASIX ) 200 mg in dextrose  5 % 100 mL (2 mg/mL) infusion 20 mg/hr (07/04/24 9386)    Antimicrobials: Anti-infectives (From admission, onward)    None       Objective: Vitals:   07/04/24 0842 07/04/24 1118  BP:  112/86  Pulse:  100  Resp:  18  Temp:  98.3 F (36.8 C)  SpO2: 99% 93%    Intake/Output Summary (Last 24 hours) at 07/04/2024 1353 Last data filed at 07/04/2024 1120 Gross per 24 hour  Intake 883.65 ml  Output 8250 ml  Net -7366.35 ml   Filed Weights   07/02/24 0533 07/03/24 0438 07/04/24 0412  Weight: 117.1 kg 116 kg 110.1 kg   Weight change: -5.912 kg Body mass index is 45.86 kg/m.   Physical Exam: General exam: Pleasant, middle-aged African-American female.  Morbidly obese Skin: No rashes, lesions or ulcers. HEENT: Atraumatic, normocephalic, no obvious bleeding Lungs: Clear to auscultation bilaterally, not in distress CVS: S1, S2, no murmur,   GI/Abd: Soft, nontender, nondistended, bowel sound present,   CNS: Alert, awake, oriented x 3 Psychiatry: Mood better today Extremities: Trace to 1+ bilateral pedal edema, no calf  tenderness  Data Review: I have personally reviewed the laboratory data and studies available.  F/u labs ordered Unresulted Labs (From admission, onward)     Start     Ordered   07/04/24 0500  Basic metabolic panel  Daily,   R     Question:  Specimen collection method  Answer:  Lab=Lab collect   07/03/24 0818   07/04/24 0500  Cooxemetry Panel (carboxy, met, total hgb, O2 sat)  Daily,   R     Question:  Specimen collection method  Answer:  Lab=Lab collect   07/03/24 1508   07/04/24 0500  CBC  Daily,   R     Question:  Specimen collection method  Answer:  Lab=Lab collect   07/03/24 1518   07/03/24 0500  Magnesium   Daily,   R  Question:  Specimen collection method  Answer:  Lab=Lab collect   07/02/24 1055            Signed, Chapman Rota, MD [facility] 07/04/2024

## 2024-07-04 NOTE — H&P (View-Only) (Signed)
 Advanced Heart Failure Rounding Note  Cardiologist: None  Chief Complaint: CHF  Subjective:    Remains on lasix  gtt. Diuresing briskly.   Co-ox 66%. CVP 12-13 (measured personally)  Denies SOB but still feels bloated. Scr stable k 3.1  Objective:    Weight Range: 110.1 kg Body mass index is 45.86 kg/m.   Vital Signs:   Temp:  [97.6 F (36.4 C)-97.9 F (36.6 C)] 97.6 F (36.4 C) (09/06 0753) Pulse Rate:  [90-107] 100 (09/06 0753) Resp:  [17-20] 17 (09/06 0753) BP: (100-116)/(61-87) 100/61 (09/06 0753) SpO2:  [94 %-99 %] 99 % (09/06 0842) Weight:  [110.1 kg] 110.1 kg (09/06 0412) Last BM Date : 07/03/24  Weight change: Filed Weights   07/02/24 0533 07/03/24 0438 07/04/24 0412  Weight: 117.1 kg 116 kg 110.1 kg   Intake/Output:  Intake/Output Summary (Last 24 hours) at 07/04/2024 1107 Last data filed at 07/04/2024 0754 Gross per 24 hour  Intake 763.65 ml  Output 8200 ml  Net -7436.35 ml    Physical Exam    General:  Ambulating room No resp difficulty HEENT: normal Neck: supple. Jvp to jaw Carotids 2+ bilat; no bruits. No lymphadenopathy or thryomegaly appreciated. Cor: PMI nondisplaced. RegulartachyNo rubs, gallops or murmurs. Lungs: clear Abdomen: obese soft, nontender, nondistended. No hepatosplenomegaly. No bruits or masses. Good bowel sounds. Extremities: no cyanosis, clubbing, rash, 1+ edema Neuro: alert & orientedx3, cranial nerves grossly intact. moves all 4 extremities w/o difficulty. Affect pleasant   Telemetry   ST 100-130 (personally reviewed)  Labs    CBC Recent Labs    07/03/24 0348 07/04/24 0452  WBC 3.3* 10.0  HGB 14.1 14.3  HCT 43.6 43.7  MCV 84.2 83.6  PLT 282 312   Basic Metabolic Panel Recent Labs    90/94/74 1521 07/04/24 0452  NA 136 136  K 4.0 3.1*  CL 96* 93*  CO2 25 30  GLUCOSE 131* 133*  BUN 17 18  CREATININE 1.21* 0.95  CALCIUM 9.4 9.0  MG 2.2 2.0   BNP (last 3 results) Recent Labs    01/31/24 1225  07/01/24 2030 07/03/24 0348  BNP 819.2* 929.8* 1,495.4*   Hemoglobin A1C Recent Labs    07/03/24 0348  HGBA1C 6.1*   Fasting Lipid Panel Recent Labs    07/03/24 0348  CHOL 133  HDL 28*  LDLCALC 94  TRIG 57  CHOLHDL 4.8   Thyroid Function Tests Recent Labs    07/03/24 0348  TSH 1.363   Medications:     Scheduled Medications:  budesonide  (PULMICORT ) nebulizer solution  0.25 mg Nebulization BID   Chlorhexidine  Gluconate Cloth  6 each Topical Daily   dapagliflozin  propanediol  10 mg Oral Daily   digoxin   0.125 mg Oral Daily   enoxaparin  (LOVENOX ) injection  60 mg Subcutaneous Q24H   guaiFENesin   600 mg Oral BID   montelukast   10 mg Oral QHS   potassium chloride   30 mEq Oral Q4H   sodium chloride  flush  10-40 mL Intracatheter Q12H   spironolactone   25 mg Oral Daily    Infusions:  furosemide  (LASIX ) 200 mg in dextrose  5 % 100 mL (2 mg/mL) infusion 20 mg/hr (07/04/24 0613)    PRN Medications: acetaminophen  **OR** acetaminophen , baclofen , benzonatate , ipratropium-albuterol , melatonin, morphine  injection, polyethylene glycol, prochlorperazine , sodium chloride  flush  Patient Profile   Ms. Carolyn Oconnor is a 48 y.o. AAF w/ h/o asthma, obesity and systolic heart failure.   Assessment/Plan   1. Acute on Chronic Systolic Heart Failure,  NICM - Diagnosed in 4/24 - Echo 4/24: EF 20-25% (looks closer to 30-35%), RV hyperdynamic  - R/LHC 5/24: no CAD, RA 13, PA 59/42 (48), PCW 33, LVEDP 35. Fick CO 6.13, CI 2.87. TD CO/CI: 6.3/2.9. PAPi 1.3 - cMRI 5/24: LVEF 25%, RVEF 42%. NICM.  - Echo 10/24 EF 30-35% RV ok (limited images)  - Echo this admission with EF <20%, G2DD and severe RV dysfunction, RSVP 44 - Co-ox 66% CVP 12-13 - Diuresing well. Contine lasix  gtt at 20/hr - Continue spiro 25 mg daily  - Continue digoxin  0.125 mg daily  - Continue Farxiga  10 mg daily - Hold on ? blocker for now - Add losartan  tomorrow if BP will tolerate - Will need full RHC prior to d/c -  Likely will need advanced therapies soon but limited by weight, RV dysfunction and limited insight into her treatment plan   2. Morbid obesity - Body mass index is 45.86 kg/m. - Has seen PharmD for GLP1RA. A1C 6.1   3. H/o NSVT - occasional PVCs on tele - nochange   4. Suspect OSA/OHS - sleep study ordered, has not completed - may need PFTs in outpatient  5. Asthma/Cough - CXR 9/3 with chronic peribronchial/interstitial thickening and pulmonary edema - continue diuresis  - PRN tesslon pearls  6. Hypokalemia - supp  Length of Stay: 2  Toribio Fuel, MD  07/04/2024, 11:07 AM  Advanced Heart Failure Team Pager [phone] (M-F; 7a - 5p)  Please contact [facility] for night-coverage after hours (5p -7a ) and weekends on [email or web address]

## 2024-07-04 NOTE — Progress Notes (Signed)
 Progress Note  Patient Name: Carolyn Oconnor Date of Encounter: 07/04/2024  Primary Cardiologist: None  Subjective   SOB resolved.  Doing great.  7.7 L urine output with net -6.8 L on Lasix  drip 20 mg/h.  Inpatient Medications    Scheduled Meds:  budesonide  (PULMICORT ) nebulizer solution  0.25 mg Nebulization BID   Chlorhexidine  Gluconate Cloth  6 each Topical Daily   dapagliflozin  propanediol  10 mg Oral Daily   digoxin   0.125 mg Oral Daily   enoxaparin  (LOVENOX ) injection  60 mg Subcutaneous Q24H   guaiFENesin   600 mg Oral BID   montelukast   10 mg Oral QHS   potassium chloride   30 mEq Oral Q4H   potassium chloride   40 mEq Oral BID   sodium chloride  flush  10-40 mL Intracatheter Q12H   spironolactone   25 mg Oral Daily   Continuous Infusions:  furosemide  (LASIX ) 200 mg in dextrose  5 % 100 mL (2 mg/mL) infusion 20 mg/hr (07/04/24 0613)   PRN Meds: acetaminophen  **OR** acetaminophen , baclofen , benzonatate , ipratropium-albuterol , melatonin, morphine  injection, polyethylene glycol, prochlorperazine , sodium chloride  flush   Vital Signs    Vitals:   07/03/24 2327 07/04/24 0412 07/04/24 0753 07/04/24 0842  BP: 116/87 101/67 100/61   Pulse: 90 96 100   Resp: 19 17 17    Temp: 97.6 F (36.4 C) 97.7 F (36.5 C) 97.6 F (36.4 C)   TempSrc: Oral Oral Oral   SpO2: 94% 96% 95% 99%  Weight:  110.1 kg    Height:        Intake/Output Summary (Last 24 hours) at 07/04/2024 1110 Last data filed at 07/04/2024 0754 Gross per 24 hour  Intake 763.65 ml  Output 8200 ml  Net -7436.35 ml   Filed Weights   07/02/24 0533 07/03/24 0438 07/04/24 0412  Weight: 117.1 kg 116 kg 110.1 kg    Telemetry     Personally reviewed.  NSR.  ECG    Not performed today.  Physical Exam   GEN: No acute distress.   Neck: Unable to examine due to body habitus. Cardiac: RRR, no murmur, rub, or gallop.  Respiratory: Nonlabored. Clear to auscultation bilaterally. GI: Soft, nontender, bowel sounds  present. MS: No edema; No deformity. Neuro:  Nonfocal. Psych: Alert and oriented x 3. Normal affect.  Labs    Chemistry Recent Labs  Lab 07/03/24 0348 07/03/24 1521 07/04/24 0452  NA 135 136 136  K 3.9 4.0 3.1*  CL 102 96* 93*  CO2 22 25 30   GLUCOSE 123* 131* 133*  BUN 14 17 18   CREATININE 1.08* 1.21* 0.95  CALCIUM 9.2 9.4 9.0  GFRNONAA >60 56* >60  ANIONGAP 11 15 13      Hematology Recent Labs  Lab 07/02/24 0726 07/03/24 0348 07/04/24 0452  WBC 5.8 3.3* 10.0  RBC 5.08 5.18* 5.23*  HGB 13.9 14.1 14.3  HCT 42.8 43.6 43.7  MCV 84.3 84.2 83.6  MCH 27.4 27.2 27.3  MCHC 32.5 32.3 32.7  RDW 15.2 15.2 14.9  PLT 324 282 312    Cardiac Enzymes Recent Labs  Lab 07/02/24 0709  TROPONINIHS 17    BNP Recent Labs  Lab 07/01/24 2030 07/03/24 0348  BNP 929.8* 1,495.4*     DDimerNo results for input(s): DDIMER in the last 168 hours.    Assessment & Plan   Biventricular heart failure - Presented with DOE, orthopnea, productive cough of clear sputum x 2 weeks that has been worsening. - BNP elevated, 1,495.  Echocardiogram this admission showed  LVEF less than 20%, severe RV dysfunction, moderate RV enlargement, mild to moderate MR, CVP 15 mmHg. - Initially on IV Lasix  bolus 80 mg twice daily, that was switched to Lasix  drip at 20 mg/h on 07/03/2024.  On Lasix  drip 20 mg/h via PICC line, she made 7.7 mL urine output in the last 24 hours with net -6.8L.  CVP 7 mmHg this a.m.  Serum creatinine normalized today. - Patient symptomatically improved.  Does not have SOB with exertion.  Doing great overall. - Will continue Lasix  drip at 20 mg/h provide for now, anticipating to cut back to 15 mg/h tomorrow a.m. - Continue remaining GDMT, digoxin  0.125 mg once daily, Farxiga  10 mg once daily, spironolactone  25 mg daily. - Evaluated by AHF, not a candidate for advanced heart failure therapies due to morbid obesity.  Not a LVAD candidate due to RV failure.  Cardiorenal syndrome,  resolved - Continue above plan.  25 minutes spent in reviewing the prior records, imaging, more than 3 labs, discussion of the above problems with the patient and documentation.  Signed, Diannah SHAUNNA Maywood, MD  07/04/2024, 11:10 AM

## 2024-07-04 NOTE — Progress Notes (Signed)
 [facility] night cross cover note:   I was notified by the patient's RN that the patient's potassium level is 3.1 this morning.  This is compared to potassium level of 4.0 yesterday morning, with interval decrease in creatinine from 1.21 to 0.95.  I subsequently ordered potassium chloride  40 meq p.o. x 1 dose now.  Corresponding magnesium  level this morning is noted to be 2.0.    Eva Pore, DO Hospitalist

## 2024-07-04 NOTE — Progress Notes (Addendum)
 Advanced Heart Failure Rounding Note  Cardiologist: None  Chief Complaint: CHF  Subjective:    Remains on lasix  gtt. Diuresing briskly.   Co-ox 66%. CVP 12-13 (measured personally)  Denies SOB but still feels bloated. Scr stable k 3.1  Objective:    Weight Range: 110.1 kg Body mass index is 45.86 kg/m.   Vital Signs:   Temp:  [97.6 F (36.4 C)-97.9 F (36.6 C)] 97.6 F (36.4 C) (09/06 0753) Pulse Rate:  [90-107] 100 (09/06 0753) Resp:  [17-20] 17 (09/06 0753) BP: (100-116)/(61-87) 100/61 (09/06 0753) SpO2:  [94 %-99 %] 99 % (09/06 0842) Weight:  [110.1 kg] 110.1 kg (09/06 0412) Last BM Date : 07/03/24  Weight change: Filed Weights   07/02/24 0533 07/03/24 0438 07/04/24 0412  Weight: 117.1 kg 116 kg 110.1 kg   Intake/Output:  Intake/Output Summary (Last 24 hours) at 07/04/2024 1107 Last data filed at 07/04/2024 0754 Gross per 24 hour  Intake 763.65 ml  Output 8200 ml  Net -7436.35 ml    Physical Exam    General:  Ambulating room No resp difficulty HEENT: normal Neck: supple. Jvp to jaw Carotids 2+ bilat; no bruits. No lymphadenopathy or thryomegaly appreciated. Cor: PMI nondisplaced. RegulartachyNo rubs, gallops or murmurs. Lungs: clear Abdomen: obese soft, nontender, nondistended. No hepatosplenomegaly. No bruits or masses. Good bowel sounds. Extremities: no cyanosis, clubbing, rash, 1+ edema Neuro: alert & orientedx3, cranial nerves grossly intact. moves all 4 extremities w/o difficulty. Affect pleasant   Telemetry   ST 100-130 (personally reviewed)  Labs    CBC Recent Labs    07/03/24 0348 07/04/24 0452  WBC 3.3* 10.0  HGB 14.1 14.3  HCT 43.6 43.7  MCV 84.2 83.6  PLT 282 312   Basic Metabolic Panel Recent Labs    90/94/74 1521 07/04/24 0452  NA 136 136  K 4.0 3.1*  CL 96* 93*  CO2 25 30  GLUCOSE 131* 133*  BUN 17 18  CREATININE 1.21* 0.95  CALCIUM 9.4 9.0  MG 2.2 2.0   BNP (last 3 results) Recent Labs    01/31/24 1225  07/01/24 2030 07/03/24 0348  BNP 819.2* 929.8* 1,495.4*   Hemoglobin A1C Recent Labs    07/03/24 0348  HGBA1C 6.1*   Fasting Lipid Panel Recent Labs    07/03/24 0348  CHOL 133  HDL 28*  LDLCALC 94  TRIG 57  CHOLHDL 4.8   Thyroid Function Tests Recent Labs    07/03/24 0348  TSH 1.363   Medications:     Scheduled Medications:  budesonide  (PULMICORT ) nebulizer solution  0.25 mg Nebulization BID   Chlorhexidine  Gluconate Cloth  6 each Topical Daily   dapagliflozin  propanediol  10 mg Oral Daily   digoxin   0.125 mg Oral Daily   enoxaparin  (LOVENOX ) injection  60 mg Subcutaneous Q24H   guaiFENesin   600 mg Oral BID   montelukast   10 mg Oral QHS   potassium chloride   30 mEq Oral Q4H   sodium chloride  flush  10-40 mL Intracatheter Q12H   spironolactone   25 mg Oral Daily    Infusions:  furosemide  (LASIX ) 200 mg in dextrose  5 % 100 mL (2 mg/mL) infusion 20 mg/hr (07/04/24 0613)    PRN Medications: acetaminophen  **OR** acetaminophen , baclofen , benzonatate , ipratropium-albuterol , melatonin, morphine  injection, polyethylene glycol, prochlorperazine , sodium chloride  flush  Patient Profile   Ms. Whyte is a 48 y.o. AAF w/ h/o asthma, obesity and systolic heart failure.   Assessment/Plan   1. Acute on Chronic Systolic Heart Failure,  NICM - Diagnosed in 4/24 - Echo 4/24: EF 20-25% (looks closer to 30-35%), RV hyperdynamic  - R/LHC 5/24: no CAD, RA 13, PA 59/42 (48), PCW 33, LVEDP 35. Fick CO 6.13, CI 2.87. TD CO/CI: 6.3/2.9. PAPi 1.3 - cMRI 5/24: LVEF 25%, RVEF 42%. NICM.  - Echo 10/24 EF 30-35% RV ok (limited images)  - Echo this admission with EF <20%, G2DD and severe RV dysfunction, RSVP 44 - Co-ox 66% CVP 12-13 - Diuresing well. Contine lasix  gtt at 20/hr - Continue spiro 25 mg daily  - Continue digoxin  0.125 mg daily  - Continue Farxiga  10 mg daily - Hold on ? blocker for now - Add losartan  tomorrow if BP will tolerate - Will need full RHC prior to d/c -  Likely will need advanced therapies soon but limited by weight, RV dysfunction and limited insight into her treatment plan   2. Morbid obesity - Body mass index is 45.86 kg/m. - Has seen PharmD for GLP1RA. A1C 6.1   3. H/o NSVT - occasional PVCs on tele - nochange   4. Suspect OSA/OHS - sleep study ordered, has not completed - may need PFTs in outpatient  5. Asthma/Cough - CXR 9/3 with chronic peribronchial/interstitial thickening and pulmonary edema - continue diuresis  - PRN tesslon pearls  6. Hypokalemia - supp  Length of Stay: 2  Toribio Fuel, MD  07/04/2024, 11:07 AM  Advanced Heart Failure Team Pager [phone] (M-F; 7a - 5p)  Please contact [facility] for night-coverage after hours (5p -7a ) and weekends on [email or web address]

## 2024-07-05 DIAGNOSIS — I5043 Acute on chronic combined systolic (congestive) and diastolic (congestive) heart failure: Secondary | ICD-10-CM | POA: Diagnosis not present

## 2024-07-05 LAB — BASIC METABOLIC PANEL WITH GFR
Anion gap: 14 (ref 5–15)
BUN: 18 mg/dL (ref 6–20)
CO2: 28 mmol/L (ref 22–32)
Calcium: 8.9 mg/dL (ref 8.9–10.3)
Chloride: 95 mmol/L — ABNORMAL LOW (ref 98–111)
Creatinine, Ser: 1.09 mg/dL — ABNORMAL HIGH (ref 0.44–1.00)
GFR, Estimated: >60 mL/min (ref >60)
Glucose, Bld: 112 mg/dL — ABNORMAL HIGH (ref 70–99)
Potassium: 3.2 mmol/L — ABNORMAL LOW (ref 3.5–5.1)
Sodium: 137 mmol/L (ref 135–145)

## 2024-07-05 LAB — CBC
HCT: 47.3 % — ABNORMAL HIGH (ref 36.0–46.0)
Hemoglobin: 15.6 g/dL — ABNORMAL HIGH (ref 12.0–15.0)
MCH: 27.3 pg (ref 26.0–34.0)
MCHC: 33 g/dL (ref 30.0–36.0)
MCV: 82.7 fL (ref 80.0–100.0)
Platelets: 309 K/uL (ref 150–400)
RBC: 5.72 MIL/uL — ABNORMAL HIGH (ref 3.87–5.11)
RDW: 14.7 % (ref 11.5–15.5)
WBC: 8.5 K/uL (ref 4.0–10.5)
nRBC: 0 % (ref 0.0–0.2)

## 2024-07-05 LAB — MAGNESIUM: Magnesium: 2.1 mg/dL (ref 1.7–2.4)

## 2024-07-05 LAB — COOXEMETRY PANEL
Carboxyhemoglobin: 1.4 % (ref 0.5–1.5)
Methemoglobin: <0.7 % (ref 0.0–1.5)
O2 Saturation: 57.4 %
Total hemoglobin: 16.2 g/dL — ABNORMAL HIGH (ref 12.0–16.0)

## 2024-07-05 MED ORDER — POTASSIUM CHLORIDE CRYS ER 20 MEQ PO TBCR
40.0000 meq | EXTENDED_RELEASE_TABLET | Freq: Once | ORAL | Status: DC
  Filled 2024-07-05: qty 2

## 2024-07-05 MED ORDER — ADULT MULTIVITAMIN W/MINERALS CH
1.0000 | ORAL_TABLET | Freq: Every day | ORAL | Status: DC
  Administered 2024-07-05 – 2024-07-07 (×3): 1 via ORAL
  Filled 2024-07-05 (×3): qty 1

## 2024-07-05 MED ORDER — TORSEMIDE 20 MG PO TABS
60.0000 mg | ORAL_TABLET | Freq: Every day | ORAL | Status: DC
  Administered 2024-07-06: 60 mg via ORAL
  Filled 2024-07-05: qty 3

## 2024-07-05 NOTE — Discharge Instructions (Signed)

## 2024-07-05 NOTE — Progress Notes (Signed)
 PROGRESS NOTE  Carolyn Oconnor  DOB: [birth year]  PCP: Carolyn Jereld BROCKS, NP FMW:[id number]  DOA: 07/01/2024  LOS: 3 days  Hospital Day: 5  Brief narrative: Carolyn Oconnor is a 48 y.o. female with PMH significant for morbid obesity, asthma, chronic systolic CHF.  Patient has systolic CHF diagnosed since April 2024.  She has been hospitalized couple of times since then for CHF exacerbation.  Most recent echo from October 2024 had shown EF of 25 to 30% with grade 2 diastolic dysfunction.  Last seen at heart failure clinic on April 2025 for worsening symptoms.  Torsemide  dose was increased and repeat echo was ordered.  But unfortunately, she lost to follow-up appointment.  Apparently has not been compliant to her medicines as well.  Works as a Architect.  9/3, patient presented to the ED with complaint of progressive dyspnea, productive cough, progressive bilateral lower extremity edema over the past 2 weeks.  In the ED, patient was afebrile, heart rate in 90s and 100s, blood pressure in low 100s, breathing room air Labs with creatinine in normal range, potassium low at 3.1, CBC unremarkable Chest x-ray showed chronic peribronchial interstitial thickening. Patient was started on IV Lasix  Admitted to [facility] CHF service was consulted  Subjective: Patient was seen and examined this morning. Not in distress. On Lasix  drip Total of 12 L of dialysis so far.  Assessment and plan: Acute exacerbation of chronic systolic CHF Presented with progressive dyspnea, bilateral lower extremity edema, cough in the setting of systolic CHF and noncompliance to meds/follow-up. Heart failure service involved.   Echocardiogram 9/4 showed drop in EF down to less than 20% with severely decreased LV function, global hypokinesis, moderately dilated cavity size, grade 2 diastolic dysfunction, severely reduced RV systolic function, moderately enlarged RV size, mildly elevated pulm artery pressure to 44 Defer  to heart failure service for GDMT: Currently on IV Lasix  drip along with digoxin , Aldactone , Farxiga . Nearly 12 L of diuresis since admission. Continue to monitor for daily intake output, weight, blood pressure, BNP, renal function and electrolytes. Net IO Since Admission: -11,905.66 mL [07/05/24 1150] Recent Labs  Lab 07/01/24 2030 07/01/24 2035 07/02/24 0726 07/02/24 1854 07/03/24 0348 07/03/24 1521 07/04/24 0452 07/05/24 0407  BNP 929.8*  --   --   --  1,495.4*  --   --   --   BUN 14   < > 9 14 14 17 18 18   CREATININE 0.85   < > 0.96 1.38* 1.08* 1.21* 0.95 1.09*  NA 136   < > 137 136 135 136 136 137  K 2.8*   < > 3.1* 3.6 3.9 4.0 3.1* 3.2*  MG  --    < > 1.9  --  2.4 2.2 2.0 2.1   < > = values in this interval not displayed.   Hypokalemia Potassium level low at 3.2 this morning.  On potassium supplement. AKI Creatinine was elevated on 9/4 due to diuresis.  Improved with diuresis.  Normal creatinine today  Muscle cramps Complaint of muscle cramps yesterday.  Potassium and magnesium  level being followed.  Replacement as above  H/o asthma Continue bronchodilators, Singulair  Was started on prednisone  60 mg daily on admission.  To avoid volume retention, I stopped prednisone  on 9/5  Morbid Obesity  Body mass index is 44.1 kg/m. Patient has been advised to make an attempt to improve diet and exercise patterns to aid in weight loss.    Mobility: Encourage ambulation  Goals of care  Code Status: Full Code     DVT prophylaxis:  Place TED hose Start: 07/02/24 1102   Antimicrobials: None Fluid: None Consultants: CHF service Family Communication: None at bedside  Status: Inpatient Level of care:  Telemetry Cardiac   Patient is from: Home Needs to continue in-hospital care: Ongoing workup for CHF Anticipated d/c to: Hopefully home ultimately   Diet:  Diet Order             Diet 2 gram sodium Fluid consistency: Thin  Diet effective now                    Scheduled Meds:  budesonide  (PULMICORT ) nebulizer solution  0.25 mg Nebulization BID   Chlorhexidine  Gluconate Cloth  6 each Topical Daily   dapagliflozin  propanediol  10 mg Oral Daily   digoxin   0.125 mg Oral Daily   enoxaparin  (LOVENOX ) injection  60 mg Subcutaneous Q24H   guaiFENesin   600 mg Oral BID   montelukast   10 mg Oral QHS   multivitamin with minerals  1 tablet Oral Daily   potassium chloride   40 mEq Oral BID   sodium chloride  flush  10-40 mL Intracatheter Q12H   spironolactone   25 mg Oral Daily    PRN meds: acetaminophen  **OR** acetaminophen , baclofen , benzonatate , ipratropium-albuterol , melatonin, morphine  injection, polyethylene glycol, prochlorperazine , sodium chloride  flush   Infusions:   furosemide  (LASIX ) 200 mg in dextrose  5 % 100 mL (2 mg/mL) infusion 20 mg/hr (07/05/24 0358)    Antimicrobials: Anti-infectives (From admission, onward)    None       Objective: Vitals:   07/05/24 0728 07/05/24 0746  BP:  102/74  Pulse: (!) 105 (!) 105  Resp: 18 19  Temp:  97.8 F (36.6 C)  SpO2: 96% 95%    Intake/Output Summary (Last 24 hours) at 07/05/2024 1150 Last data filed at 07/05/2024 0748 Gross per 24 hour  Intake 350.69 ml  Output 3000 ml  Net -2649.31 ml   Filed Weights   07/03/24 0438 07/04/24 0412 07/05/24 0505  Weight: 116 kg 110.1 kg 105.9 kg   Weight change: -4.218 kg Body mass index is 44.1 kg/m.   Physical Exam: General exam: Pleasant, middle-aged African-American female.  Morbidly obese Skin: No rashes, lesions or ulcers. HEENT: Atraumatic, normocephalic, no obvious bleeding Lungs: Clear to auscultation bilaterally, not in distress.  Not on supplemental oxygen CVS: S1, S2, no murmur,   GI/Abd: Soft, nontender, nondistended, bowel sound present,   CNS: Alert, awake, oriented x 3 Psychiatry: Mood appropriate Extremities: Improving bilateral pedal edema, no calf tenderness  Data Review: I have personally reviewed the laboratory data  and studies available.  F/u labs ordered Unresulted Labs (From admission, onward)     Start     Ordered   07/04/24 0500  Basic metabolic panel  Daily,   R     Question:  Specimen collection method  Answer:  Lab=Lab collect   07/03/24 0818   07/04/24 0500  Cooxemetry Panel (carboxy, met, total hgb, O2 sat)  Daily,   R     Question:  Specimen collection method  Answer:  Lab=Lab collect   07/03/24 1508   07/04/24 0500  CBC  Daily,   R     Question:  Specimen collection method  Answer:  Lab=Lab collect   07/03/24 1518   07/03/24 0500  Magnesium   Daily,   R     Question:  Specimen collection method  Answer:  Lab=Lab collect   07/02/24 1055  Signed, Chapman Rota, MD [facility] 07/05/2024

## 2024-07-05 NOTE — Progress Notes (Signed)
 Initial Nutrition Assessment  DOCUMENTATION CODES:   Morbid obesity  INTERVENTION:   -MVI with minerals daily -Liberalize diet to 2 gram sodium for wider variety of meal selections -RD provided Low Sodium Nutrition Therapy handout from [facility] Nutrition Care Manual; attached to AVS/ discharge summary -RD referred pt to [facility] for further support and reinforcement  NUTRITION DIAGNOSIS:   Increased nutrient needs related to chronic illness (CHF) as evidenced by estimated needs.  GOAL:   Patient will meet greater than or equal to 90% of their needs  MONITOR:   PO intake, Supplement acceptance  REASON FOR ASSESSMENT:   Consult Assessment of nutrition requirement/status  ASSESSMENT:   Pt with past medical history of HFrEF LVEF 25-30% with Grade II diastolic dysfunction in October 2024, asthma, allergic rhinitis, and obesity. She presented for a 2-week history of dyspnea that has been worsening over the last several days.  Pt admitted with CHF, medication noncompliance, and asthma exacerbation.   Reviewed I/O's: -3.6 L x 24 hours and -11.6 L since admission  UOP: 4.2 L x 24 hours  Pt unavailable at time of visit. Attempted to speak with pt via call to hospital room phone, however, unable to reach. RD unable to obtain further nutrition-related history or complete nutrition-focused physical exam at this time.    Per H&P, pt with bilateral extremity edema on admission and had been missing doses of her home medications.  Pt currently on a heart healthy diet. Noted meal completions 80-100%.   Pt is followed by the [facility]. Last seen at [facility] on 01/2024 and was lost to follow-up. Noted pt had prior admission for CHF on 01/2023; discharge weight 248#. She is followed by the Advanced Heart Failure Team pharmacist for GLP1RA per notes, however, medication not indicated on ome med list.    Per  Advanced Heart Failure Team notes, pt will likely require advanced therapies soon, however, candidacy limited by weight, RV dysfunction, and limited insight into treatment plan,  Per TOC notes, pt eats out frequently. TOC requesting RD consult.   Reviewed wt hx; pt has experienced a 9.8% wt loss over the past month, which is significant for time frame. Suspect some wt loss may be related to diuresis. Pt -11.6 L since admission.   Obesity is a complex, chronic medical condition that is optimally managed by a multidisciplinary care team. Weight loss is not an ideal goal for an acute inpatient hospitalization. However, if further work-up for obesity is warranted, consider outpatient referral to [facility].    Medications reviewed and include lovenox , potassium chloride , aldactone , and lasix .    Lab Results  Component Value Date   HGBA1C 6.1 (H) 07/03/2024   PTA DM medications are 10 mg farxiga  daily.   Labs reviewed: K: 3.2 (on PO supplementation).  Diet Order:   Diet Order             Diet Heart Room service appropriate? Yes; Fluid consistency: Thin  Diet effective now                   EDUCATION NEEDS:   No education needs have been identified at this time  Skin:  Skin Assessment: Reviewed RN Assessment  Last BM:  07/04/24  Height:   Ht Readings from Last 1 Encounters:  07/02/24 5' 1 (1.549 m)    Weight:   Wt Readings from Last 1 Encounters:  07/05/24 105.9  kg    Ideal Body Weight:  47.7 kg  BMI:  Body mass index is 44.1 kg/m.  Estimated Nutritional Needs:   Kcal:  1700-1900  Protein:  90-105 grams  Fluid:  1.7-1.9 L    Margery ORN, RD, LDN, CDCES Registered Dietitian III Certified Diabetes Care and Education Specialist If unable to reach this RD, please use RD Inpatient group chat on secure chat between hours of 8am-4 pm daily

## 2024-07-05 NOTE — Progress Notes (Signed)
 Advanced Heart Failure Rounding Note  Cardiologist: None  Chief Complaint: CHF  Subjective:    Remains on lasix  gtt. Diuresing briskly.  Down another 4 L.  Weight down 26 pounds total.  Co. ox is 57%.  Serum creatinine is stable at 1.1.  Potassium is 3.2   Objective:    Weight Range: 105.9 kg Body mass index is 44.1 kg/m.   Vital Signs:   Temp:  [97.7 F (36.5 C)-98.4 F (36.9 C)] 97.8 F (36.6 C) (09/07 0746) Pulse Rate:  [100-109] 105 (09/07 0746) Resp:  [18-20] 19 (09/07 0746) BP: (98-117)/(53-90) 102/74 (09/07 0746) SpO2:  [93 %-96 %] 95 % (09/07 0746) Weight:  [105.9 kg] 105.9 kg (09/07 0505) Last BM Date : 07/03/24  Weight change: Filed Weights   07/03/24 0438 07/04/24 0412 07/05/24 0505  Weight: 116 kg 110.1 kg 105.9 kg   Intake/Output:  Intake/Output Summary (Last 24 hours) at 07/05/2024 0950 Last data filed at 07/05/2024 0748 Gross per 24 hour  Intake 470.69 ml  Output 3750 ml  Net -3279.31 ml    Physical Exam    Obese woman sitting up in bed no acute distress Neck supple and thick.  AVP hard to see. Cardiac regular and tachycardic. Lungs clear Abdomen obese soft nontender nondistended. Extremities were warm with no cyanosis clubbing.  Trace edema.  Telemetry   ST 100-110 personally reviewed  Labs    CBC Recent Labs    07/04/24 0452 07/05/24 0407  WBC 10.0 8.5  HGB 14.3 15.6*  HCT 43.7 47.3*  MCV 83.6 82.7  PLT 312 309   Basic Metabolic Panel Recent Labs    90/93/74 0452 07/05/24 0407  NA 136 137  K 3.1* 3.2*  CL 93* 95*  CO2 30 28  GLUCOSE 133* 112*  BUN 18 18  CREATININE 0.95 1.09*  CALCIUM 9.0 8.9  MG 2.0 2.1   BNP (last 3 results) Recent Labs    01/31/24 1225 07/01/24 2030 07/03/24 0348  BNP 819.2* 929.8* 1,495.4*   Hemoglobin A1C Recent Labs    07/03/24 0348  HGBA1C 6.1*   Fasting Lipid Panel Recent Labs    07/03/24 0348  CHOL 133  HDL 28*  LDLCALC 94  TRIG 57  CHOLHDL 4.8   Thyroid Function  Tests Recent Labs    07/03/24 0348  TSH 1.363   Medications:     Scheduled Medications:  budesonide  (PULMICORT ) nebulizer solution  0.25 mg Nebulization BID   Chlorhexidine  Gluconate Cloth  6 each Topical Daily   dapagliflozin  propanediol  10 mg Oral Daily   digoxin   0.125 mg Oral Daily   enoxaparin  (LOVENOX ) injection  60 mg Subcutaneous Q24H   guaiFENesin   600 mg Oral BID   montelukast   10 mg Oral QHS   multivitamin with minerals  1 tablet Oral Daily   potassium chloride   40 mEq Oral BID   potassium chloride   40 mEq Oral Once   sodium chloride  flush  10-40 mL Intracatheter Q12H   spironolactone   25 mg Oral Daily    Infusions:  furosemide  (LASIX ) 200 mg in dextrose  5 % 100 mL (2 mg/mL) infusion 20 mg/hr (07/05/24 0358)    PRN Medications: acetaminophen  **OR** acetaminophen , baclofen , benzonatate , ipratropium-albuterol , melatonin, morphine  injection, polyethylene glycol, prochlorperazine , sodium chloride  flush  Patient Profile   Carolyn Oconnor is a 48 y.o. AAF w/ h/o asthma, obesity and systolic heart failure.   Assessment/Plan   1. Acute on Chronic Systolic Heart Failure, NICM - Diagnosed in 4/24 -  Echo 4/24: EF 20-25% (looks closer to 30-35%), RV hyperdynamic  - R/LHC 5/24: no CAD, RA 13, PA 59/42 (48), PCW 33, LVEDP 35. Fick CO 6.13, CI 2.87. TD CO/CI: 6.3/2.9. PAPi 1.3 - cMRI 5/24: LVEF 25%, RVEF 42%. NICM.  - Echo 10/24 EF 30-35% RV ok (limited images)  - Echo this admission with EF <20%, G2DD and severe RV dysfunction, RSVP 44 - Co-ox 57% CVP 5-6 - Diuresing well weight down 26 pounds. CVP down. Stop lasix  gtt - Continue spiro 25 mg daily  - Continue digoxin  0.125 mg daily  - Continue Farxiga  10 mg daily - Hold on ? blocker for now - Add losartan  tomorrow if BP will tolerate - Will need full RHC tomorrow - Likely will need advanced therapies soon but limited by weight, RV dysfunction and limited insight into her treatment plan   2. Morbid obesity - Body mass  index is 44.1 kg/m. - Has seen PharmD for GLP1RA. A1C 6.1   3. H/o NSVT - occasional PVCs on tele - quiescent  4. Suspect OSA/OHS - sleep study ordered, has not completed - may need PFTs in outpatient  5. Asthma/Cough - CXR 9/3 with chronic peribronchial/interstitial thickening and pulmonary edema - continue diuresis  - PRN tesslon pearls  6. Hypokalemia - Will supp  Length of Stay: 3  Toribio Fuel, MD  07/05/2024, 9:50 AM  Advanced Heart Failure Team Pager [phone] (M-F; 7a - 5p)  Please contact [facility] for night-coverage after hours (5p -7a ) and weekends on [email or web address]

## 2024-07-06 ENCOUNTER — Encounter (HOSPITAL_COMMUNITY)
Admission: EM | Disposition: A | Discharge status: Home / Self Care | Payer: Self-pay | Source: Home / Self Care | Attending: Internal Medicine

## 2024-07-06 DIAGNOSIS — I5043 Acute on chronic combined systolic (congestive) and diastolic (congestive) heart failure: Secondary | ICD-10-CM | POA: Diagnosis not present

## 2024-07-06 HISTORY — PX: RIGHT HEART CATH: CATH118263

## 2024-07-06 LAB — POCT I-STAT EG7
Acid-Base Excess: 5 mmol/L — ABNORMAL HIGH (ref 0.0–2.0)
Acid-Base Excess: 5 mmol/L — ABNORMAL HIGH (ref 0.0–2.0)
Acid-Base Excess: 6 mmol/L — ABNORMAL HIGH (ref 0.0–2.0)
Bicarbonate: 28.6 mmol/L — ABNORMAL HIGH (ref 20.0–28.0)
Bicarbonate: 29 mmol/L — ABNORMAL HIGH (ref 20.0–28.0)
Bicarbonate: 30 mmol/L — ABNORMAL HIGH (ref 20.0–28.0)
Calcium, Ion: 0.98 mmol/L — ABNORMAL LOW (ref 1.15–1.40)
Calcium, Ion: 1.12 mmol/L — ABNORMAL LOW (ref 1.15–1.40)
Calcium, Ion: 1.15 mmol/L (ref 1.15–1.40)
HCT: 50 % — ABNORMAL HIGH (ref 36.0–46.0)
HCT: 52 % — ABNORMAL HIGH (ref 36.0–46.0)
HCT: 52 % — ABNORMAL HIGH (ref 36.0–46.0)
Hemoglobin: 17 g/dL — ABNORMAL HIGH (ref 12.0–15.0)
Hemoglobin: 17.7 g/dL — ABNORMAL HIGH (ref 12.0–15.0)
Hemoglobin: 17.7 g/dL — ABNORMAL HIGH (ref 12.0–15.0)
O2 Saturation: 73 %
O2 Saturation: 75 %
O2 Saturation: 75 %
Potassium: 3.4 mmol/L — ABNORMAL LOW (ref 3.5–5.1)
Potassium: 3.8 mmol/L (ref 3.5–5.1)
Potassium: 3.8 mmol/L (ref 3.5–5.1)
Sodium: 138 mmol/L (ref 135–145)
Sodium: 138 mmol/L (ref 135–145)
Sodium: 141 mmol/L (ref 135–145)
TCO2: 30 mmol/L (ref 22–32)
TCO2: 30 mmol/L (ref 22–32)
TCO2: 31 mmol/L (ref 22–32)
pCO2, Ven: 39 mmHg — ABNORMAL LOW (ref 44–60)
pCO2, Ven: 39.2 mmHg — ABNORMAL LOW (ref 44–60)
pCO2, Ven: 39.7 mmHg — ABNORMAL LOW (ref 44–60)
pH, Ven: 7.473 — ABNORMAL HIGH (ref 7.25–7.43)
pH, Ven: 7.478 — ABNORMAL HIGH (ref 7.25–7.43)
pH, Ven: 7.486 — ABNORMAL HIGH (ref 7.25–7.43)
pO2, Ven: 36 mmHg (ref 32–45)
pO2, Ven: 37 mmHg (ref 32–45)
pO2, Ven: 37 mmHg (ref 32–45)

## 2024-07-06 LAB — CBC
HCT: 49.7 % — ABNORMAL HIGH (ref 36.0–46.0)
Hemoglobin: 16.3 g/dL — ABNORMAL HIGH (ref 12.0–15.0)
MCH: 27.2 pg (ref 26.0–34.0)
MCHC: 32.8 g/dL (ref 30.0–36.0)
MCV: 82.8 fL (ref 80.0–100.0)
Platelets: 336 K/uL (ref 150–400)
RBC: 6 MIL/uL — ABNORMAL HIGH (ref 3.87–5.11)
RDW: 14.7 % (ref 11.5–15.5)
WBC: 6.7 K/uL (ref 4.0–10.5)
nRBC: 0 % (ref 0.0–0.2)

## 2024-07-06 LAB — BASIC METABOLIC PANEL WITH GFR
Anion gap: 14 (ref 5–15)
BUN: 21 mg/dL — ABNORMAL HIGH (ref 6–20)
CO2: 26 mmol/L (ref 22–32)
Calcium: 8.7 mg/dL — ABNORMAL LOW (ref 8.9–10.3)
Chloride: 96 mmol/L — ABNORMAL LOW (ref 98–111)
Creatinine, Ser: 0.92 mg/dL (ref 0.44–1.00)
GFR, Estimated: >60 mL/min (ref >60)
Glucose, Bld: 85 mg/dL (ref 70–99)
Potassium: 3.5 mmol/L (ref 3.5–5.1)
Sodium: 136 mmol/L (ref 135–145)

## 2024-07-06 LAB — COOXEMETRY PANEL
Carboxyhemoglobin: 1.6 % — ABNORMAL HIGH (ref 0.5–1.5)
Methemoglobin: 0.7 % (ref 0.0–1.5)
O2 Saturation: 58.7 %
Total hemoglobin: 16.9 g/dL — ABNORMAL HIGH (ref 12.0–16.0)

## 2024-07-06 LAB — MAGNESIUM: Magnesium: 2.2 mg/dL (ref 1.7–2.4)

## 2024-07-06 SURGERY — RIGHT HEART CATH
Anesthesia: LOCAL

## 2024-07-06 MED ORDER — FENTANYL CITRATE (PF) 100 MCG/2ML IJ SOLN
INTRAMUSCULAR | Status: DC | PRN
  Administered 2024-07-06: 25 ug via INTRAVENOUS

## 2024-07-06 MED ORDER — HEPARIN (PORCINE) IN NACL 1000-0.9 UT/500ML-% IV SOLN
INTRAVENOUS | Status: DC | PRN
  Administered 2024-07-06: 500 mL

## 2024-07-06 MED ORDER — GUAIFENESIN ER 600 MG PO TB12
1200.0000 mg | ORAL_TABLET | Freq: Two times a day (BID) | ORAL | Status: DC
  Administered 2024-07-06 – 2024-07-07 (×2): 1200 mg via ORAL
  Filled 2024-07-06 (×2): qty 2

## 2024-07-06 MED ORDER — LIDOCAINE HCL (PF) 1 % IJ SOLN
INTRAMUSCULAR | Status: DC | PRN
  Administered 2024-07-06 (×3): 2 mL via INTRADERMAL

## 2024-07-06 MED ORDER — LIDOCAINE HCL (PF) 1 % IJ SOLN
INTRAMUSCULAR | Status: AC
  Filled 2024-07-06: qty 30

## 2024-07-06 MED ORDER — MIDAZOLAM HCL 2 MG/2ML IJ SOLN
INTRAMUSCULAR | Status: AC
  Filled 2024-07-06: qty 2

## 2024-07-06 MED ORDER — SODIUM CHLORIDE 0.9 % IV SOLN: INTRAVENOUS | Status: DC

## 2024-07-06 MED ORDER — LOSARTAN POTASSIUM 25 MG PO TABS
12.5000 mg | ORAL_TABLET | Freq: Every day | ORAL | Status: DC
  Administered 2024-07-06 – 2024-07-07 (×2): 12.5 mg via ORAL
  Filled 2024-07-06 (×2): qty 1

## 2024-07-06 MED ORDER — FENTANYL CITRATE (PF) 100 MCG/2ML IJ SOLN
INTRAMUSCULAR | Status: AC
  Filled 2024-07-06: qty 2

## 2024-07-06 MED ORDER — MIDAZOLAM HCL 2 MG/2ML IJ SOLN
INTRAMUSCULAR | Status: DC | PRN
  Administered 2024-07-06: 1 mg via INTRAVENOUS

## 2024-07-06 MED ORDER — ASPIRIN 81 MG PO CHEW: 81.0000 mg | CHEWABLE_TABLET | ORAL | Status: DC

## 2024-07-06 MED ORDER — POLYETHYLENE GLYCOL 3350 17 G PO PACK
17.0000 g | PACK | Freq: Two times a day (BID) | ORAL | Status: DC
  Administered 2024-07-07: 17 g via ORAL
  Filled 2024-07-06: qty 1

## 2024-07-06 MED ORDER — POTASSIUM CHLORIDE CRYS ER 20 MEQ PO TBCR
40.0000 meq | EXTENDED_RELEASE_TABLET | Freq: Once | ORAL | Status: AC
  Administered 2024-07-06: 40 meq via ORAL
  Filled 2024-07-06: qty 2

## 2024-07-06 SURGICAL SUPPLY — 8 items
CATH SWAN GANZ 7F STRAIGHT (CATHETERS) IMPLANT
GLIDESHEATH SLENDER 7FR .021G (SHEATH) IMPLANT
KIT MICROPUNCTURE NIT STIFF (SHEATH) IMPLANT
PACK CARDIAC CATHETERIZATION (CUSTOM PROCEDURE TRAY) ×2 IMPLANT
SHEATH PINNACLE 7F 10CM (SHEATH) IMPLANT
SHEATH PROBE COVER 6X72 (BAG) IMPLANT
TRANSDUCER W/STOPCOCK (MISCELLANEOUS) IMPLANT
TUBING ART PRESS 72 MALE/FEM (TUBING) IMPLANT

## 2024-07-06 NOTE — Discharge Summary (Signed)
 Advanced Heart Failure Team  Discharge Summary   Patient ID: Carolyn Oconnor MRN: [id number], DOB/AGE: [birth year] 48 y.o. Admit date: 07/01/2024 D/C date:     07/07/2024   Primary Discharge Diagnoses:  Acute on chronic systolic heart failure   Secondary Discharge Diagnoses:  Morbid obesity NSVT/PVCs Suspected OSA/OHS Asthma  Hospital Course: Carolyn Oconnor is a 48 y.o. AAF with history of obesity, asthma, chronic systolic heart failure.   Admitted 04/24 with acute systolic heart failure. Echo with LVEF 20-25%. L/RHC w/ normal coronaries, volume overload and preserved CO. cMRI: LVEF 25%, RVEF 42%, nonspecific LGE in ventricular septum  She was seen for follow-up in 04/25. She was volume overloaded. Diuretics adjusted and echo ordered. She was lost to follow-up.  Patient was admitted 07/01/24 with acute on chronic CHF.  Echo with LVEF < 20% and severely reduced RV. Had not been compliant with medications. She was diuresed and GDMT titrated. RHC yesterday with normal filling pressures and well compensated CI.   Pt will continue to be followed closely in the HF clinic. Dr. Zenaida evaluated and deemed appropriate for discharge.   General:  chronically ill appearing.  No respiratory difficulty Neck: JVD ~5 cm.  Cor: Regular rate & rhythm. No murmurs. Lungs: clear Extremities: no edema  Neuro: alert & oriented x 3. Affect pleasant.   See below for detailed problem list: 1. Acute on Chronic Systolic Heart Failure, NICM - Diagnosed in 4/24 - Echo 4/24: EF 20-25% (looks closer to 30-35%), RV hyperdynamic  - R/LHC 5/24: no CAD, RA 13, PA 59/42 (48), PCW 33, LVEDP 35. Fick CO 6.13, CI 2.87. TD CO/CI: 6.3/2.9. PAPi 1.3 - cMRI 5/24: LVEF 25%, RVEF 42%. NICM.  - Echo 10/24 EF 30-35% RV ok (limited images)  - Echo this admission with EF <20%, G2DD and severe RV dysfunction, RSVP 44 - Not taking meds regularly prior to admission.  - Diuresed well. CVP unhooked. Now on 60 mg Torsemide  daily  (prior home dose). Will decrease dose to 40 mg daily at discharge to start tomorrow, hold today. Compliance questionable and with bump in SCr, will reassess at f/u for higher dose needs.  - Continue spiro 25 mg daily  - Continue digoxin  0.125 mg daily  - Continue Farxiga  10 mg daily - Hold on  blocker for now (bisoprolol  at home) - Continue losartan  12.5 mg daily - Cardiac rehab consult - Likely will need advanced therapies soon but limited by weight, noncompliance, RV dysfunction and limited insight into her treatment plan 2. Morbid obesity - Body mass index is 43.65 kg/m.  - Has seen PharmD for GLP1RA. A1C 6.1 3. H/o NSVT - more PVCs today, follow burden 4. Suspect OSA/OHS - sleep study ordered, has not completed 5. Asthma/Cough - CXR 9/3 with chronic peribronchial/interstitial thickening and pulmonary edema - may need PFTs in outpatient setting - PRN tesslon pearls and nebs - cough likely exacerbated by volume overload 6. Hypokalemia - resolved    Discharge Weight Range: 104.6 kg Discharge Vitals: Blood pressure 99/77, pulse (!) 104, temperature 97.6 F (36.4 C), resp. rate 19, height 5' 1 (1.549 m), weight 104.8 kg, last menstrual period 07/05/2024, SpO2 91%.  Labs: Lab Results  Component Value Date   WBC 7.3 07/07/2024   HGB 16.0 (H) 07/07/2024   HCT 48.4 (H) 07/07/2024   MCV 82.6 07/07/2024   PLT 313 07/07/2024    Recent Labs  Lab 07/07/24 0455  NA 136  K 4.0  CL 97*  CO2 24  BUN 32*  CREATININE 1.41*  CALCIUM 8.9  GLUCOSE 88   Lab Results  Component Value Date   CHOL 133 07/03/2024   HDL 28 (L) 07/03/2024   LDLCALC 94 07/03/2024   TRIG 57 07/03/2024   BNP (last 3 results) Recent Labs    01/31/24 1225 07/01/24 2030 07/03/24 0348  BNP 819.2* 929.8* 1,495.4*    ProBNP (last 3 results) No results for input(s): PROBNP in the last 8760 hours.   Diagnostic Studies/Procedures   CARDIAC CATHETERIZATION Result Date: 07/07/2024 HEMODYNAMICS: RA:        2 mmHg (mean) RV:       38/1, 2 mmHg PA:       38/21 mmHg (27 mean) PCWP: 13 mmHg (mean)    Estimated Fick CO/CI   5.72L/min, 2.86L/min/m2 Thermodilution CO/CI   5.42L/min, 2.71L/min/m2    TPG  14  mmHg     PVR  2.6 Wood Units PAPi  8.5  IMPRESSION: Right heart catheterization for evaluation of pulmonary artery pressures and cardiac output Normal filling pressures Well compensated cardiac index by TD Mildly elevated pulmonary artery pressures, suspect mixed pre and post capillary PH RECOMMENDATIONS: Continue to titrate medical therapy, back on oral diuretics No immediate need for advanced therapies   Echo 9/25 EF <20%, G2DD and severe RV dysfunction, RSVP 44  Discharge Medications   Allergies as of 07/07/2024       Reactions   Aspirin     Patient states that it makes her go into an asthma attack.         Medication List     STOP taking these medications    bisoprolol  5 MG tablet Commonly known as: ZEBETA    naproxen sodium 220 MG tablet Commonly known as: ALEVE       TAKE these medications    acetaminophen  325 MG tablet Commonly known as: TYLENOL  Take 2 tablets (650 mg total) by mouth every 6 (six) hours as needed for mild pain (or Fever >/= 101). What changed: how much to take   albuterol  108 (90 Base) MCG/ACT inhaler Commonly known as: VENTOLIN  HFA Inhale 2 puffs into the lungs every 4 (four) hours as needed (cough and wheeze). For asthma attacks.   budesonide -formoterol  80-4.5 MCG/ACT inhaler Commonly known as: SYMBICORT  Inhale 2 puffs into the lungs 2 (two) times daily as needed.   dapagliflozin  propanediol 10 MG Tabs tablet Commonly known as: Farxiga  Take 1 tablet (10 mg total) by mouth daily before breakfast.   digoxin  0.125 MG tablet Commonly known as: LANOXIN  Take 1 tablet (0.125 mg total) by mouth daily.   fluticasone  50 MCG/ACT nasal spray Commonly known as: FLONASE  Place 2 sprays into both nostrils 2 (two) times daily as needed for allergies or  rhinitis.   losartan  25 MG tablet Commonly known as: COZAAR  Take 0.5 tablets (12.5 mg total) by mouth daily. What changed: how much to take   montelukast  10 MG tablet Commonly known as: SINGULAIR  Take 1 tablet (10 mg total) by mouth at bedtime.   multivitamin with minerals Tabs tablet Take 1 tablet by mouth daily.   potassium chloride  SA 20 MEQ tablet Commonly known as: KLOR-CON  M Take 2 tablets (40 mEq total) by mouth daily. What changed:  how much to take when to take this   spironolactone  25 MG tablet Commonly known as: ALDACTONE  Take 1 tablet (25 mg total) by mouth daily.   torsemide  20 MG tablet Commonly known as: DEMADEX  Take 2 tablets (40 mg total) by mouth daily. Start  taking on: July 08, 2024 What changed: how much to take        Disposition   The patient will be discharged in stable condition to home. Discharge Instructions     Amb Referral to Nutrition and Diabetic Education   Complete by: As directed        Follow-up Information     [facility] Follow up on 07/14/2024.   Specialty: Cardiology Why: Advanced [facility] Failure Clinic 10:30 AM Entrance C, Free Valet Parking Contact information: [address] [place]  [phone]                 Duration of Discharge Encounter: Greater than 35 minutes   Signed, Beckey LITTIE Coe AGACNP-BC  07/07/2024, 9:14 AM

## 2024-07-06 NOTE — Progress Notes (Signed)
 PROGRESS NOTE  Carolyn Oconnor  DOB: 1976/06/12  PCP: Phyllis Jereld BROCKS, NP FMW:[id number]  DOA: 07/01/2024  LOS: 4 days  Hospital Day: 6  Brief narrative: Carolyn Oconnor is a 48 y.o. female with PMH significant for morbid obesity, asthma, chronic systolic CHF.  Patient has systolic CHF diagnosed since April 2024.  She has been hospitalized couple of times since then for CHF exacerbation.  Most recent echo from October 2024 had shown EF of 25 to 30% with grade 2 diastolic dysfunction.  Last seen at heart failure clinic on April 2025 for worsening symptoms.  Torsemide  dose was increased and repeat echo was ordered.  But unfortunately, she lost to follow-up appointment.  Apparently has not been compliant to her medicines as well.  Works as a Architect.  9/3, patient presented to the ED with complaint of progressive dyspnea, productive cough, progressive bilateral lower extremity edema over the past 2 weeks.  In the ED, patient was afebrile, heart rate in 90s and 100s, blood pressure in low 100s, breathing room air Labs with creatinine in normal range, potassium low at 3.1, CBC unremarkable Chest x-ray showed chronic peribronchial interstitial thickening. Patient was started on IV Lasix  Admitted to [facility] CHF service was consulted  Subjective: Patient was seen and examined this morning. Sitting up at the edge of the bed.  Not in distress. Had episodes of what cough at the time of my evaluation  Assessment and plan: Acute exacerbation of chronic systolic CHF Presented with progressive dyspnea, bilateral lower extremity edema, cough in the setting of systolic CHF and noncompliance to meds/follow-up. Heart failure service involved.   Echocardiogram 9/4 showed drop in EF down to less than 20% with severely decreased LV function, global hypokinesis, moderately dilated cavity size, grade 2 diastolic dysfunction, severely reduced RV systolic function, moderately enlarged RV size, mildly  elevated pulm artery pressure to 44 Defer to heart failure service for GDMT: Currently on IV Lasix  drip along with digoxin , Aldactone , Farxiga . More than 13 of diuresis since admission. Noted plan of RHC today Continue to monitor for daily intake output, weight, blood pressure, BNP, renal function and electrolytes. Net IO Since Admission: -13,244.99 mL [07/06/24 1416] Recent Labs  Lab 07/01/24 2030 07/01/24 2035 07/03/24 0348 07/03/24 1521 07/04/24 0452 07/05/24 0407 07/06/24 0436  BNP 929.8*  --  1,495.4*  --   --   --   --   BUN 14   < > 14 17 18 18  21*  CREATININE 0.85   < > 1.08* 1.21* 0.95 1.09* 0.92  NA 136   < > 135 136 136 137 136  K 2.8*   < > 3.9 4.0 3.1* 3.2* 3.5  MG  --    < > 2.4 2.2 2.0 2.1 2.2   < > = values in this interval not displayed.   Hypokalemia Due to diuresis.  Potassium level being monitored and replaced as needed  AKI Creatinine was elevated on 9/4 due to diuresis.  Improved with diuresis.  Normal creatinine today  Muscle cramps Complaint of muscle cramps yesterday.  Potassium and magnesium  level being followed.  Replacement as above  H/o asthma Cough Continue bronchodilators, Singulair  Increase the dose of Mucinex  to 1200 mg twice daily Was started on prednisone  60 mg daily on admission.  To avoid volume retention, I stopped prednisone  on 9/5  Morbid Obesity  Body mass index is 43.16 kg/m. Patient has been advised to make an attempt to improve diet and exercise patterns to aid  in weight loss.  [facility] will sign off.  CHF service to take as primary.  Discussed with CHF service    Mobility: Encourage ambulation  Goals of care   Code Status: Full Code     DVT prophylaxis:  Place TED hose Start: 07/02/24 1102   Antimicrobials: None Fluid: None Consultants: CHF service Family Communication: None at bedside  Status: Inpatient Level of care:  Telemetry Cardiac   Patient is from: Home Needs to continue in-hospital care: Ongoing workup  for CHF Anticipated d/c to: Hopefully home ultimately   Diet:  Diet Order             Diet NPO time specified Except for: Sips with Meds  Diet effective now                   Scheduled Meds:  budesonide  (PULMICORT ) nebulizer solution  0.25 mg Nebulization BID   Chlorhexidine  Gluconate Cloth  6 each Topical Daily   dapagliflozin  propanediol  10 mg Oral Daily   digoxin   0.125 mg Oral Daily   enoxaparin  (LOVENOX ) injection  60 mg Subcutaneous Q24H   guaiFENesin   1,200 mg Oral BID   losartan   12.5 mg Oral Daily   montelukast   10 mg Oral QHS   multivitamin with minerals  1 tablet Oral Daily   polyethylene glycol  17 g Oral BID   sodium chloride  flush  10-40 mL Intracatheter Q12H   spironolactone   25 mg Oral Daily   torsemide   60 mg Oral Daily    PRN meds: acetaminophen  **OR** acetaminophen , baclofen , benzonatate , ipratropium-albuterol , melatonin, morphine  injection, prochlorperazine , sodium chloride  flush   Infusions:   sodium chloride       Antimicrobials: Anti-infectives (From admission, onward)    None       Objective: Vitals:   07/06/24 0954 07/06/24 1113  BP:  109/68  Pulse: 98   Resp:  19  Temp:  98.1 F (36.7 C)  SpO2:  96%    Intake/Output Summary (Last 24 hours) at 07/06/2024 1416 Last data filed at 07/06/2024 0430 Gross per 24 hour  Intake 460.67 ml  Output 1800 ml  Net -1339.33 ml   Filed Weights   07/04/24 0412 07/05/24 0505 07/06/24 0500  Weight: 110.1 kg 105.9 kg 103.6 kg   Weight change: -2.27 kg Body mass index is 43.16 kg/m.   Physical Exam: General exam: Pleasant, middle-aged African-American female.  Morbidly obese Skin: No rashes, lesions or ulcers. HEENT: Atraumatic, normocephalic, no obvious bleeding Lungs: Clear to auscultation bilaterally, not in distress.  No crackles or wheezing CVS: S1, S2, no murmur,   GI/Abd: Soft, nontender, nondistended, bowel sound present,   CNS: Alert, awake, oriented x 3 Psychiatry: Mood  appropriate Extremities: Improving bilateral pedal edema, no calf tenderness  Data Review: I have personally reviewed the laboratory data and studies available.  F/u labs ordered Unresulted Labs (From admission, onward)     Start     Ordered   07/04/24 0500  Basic metabolic panel  Daily,   R     Question:  Specimen collection method  Answer:  Lab=Lab collect   07/03/24 0818   07/04/24 0500  Cooxemetry Panel (carboxy, met, total hgb, O2 sat)  Daily,   R     Question:  Specimen collection method  Answer:  Lab=Lab collect   07/03/24 1508   07/04/24 0500  CBC  Daily,   R     Question:  Specimen collection method  Answer:  Lab=Lab collect  07/03/24 1518   07/03/24 0500  Magnesium   Daily,   R     Question:  Specimen collection method  Answer:  Lab=Lab collect   07/02/24 1055            Signed, Chapman Rota, MD [facility] 07/06/2024

## 2024-07-06 NOTE — Progress Notes (Signed)
 Mobility Specialist Progress Note;   07/06/24 1243  Mobility  Activity Ambulated independently  Level of Assistance Independent  Assistive Device None  Distance Ambulated (ft) 700 ft  Activity Response Tolerated well  Mobility Referral Yes  Mobility visit 1 Mobility  Mobility Specialist Start Time (ACUTE ONLY) 1243  Mobility Specialist Stop Time (ACUTE ONLY) 1255  Mobility Specialist Time Calculation (min) (ACUTE ONLY) 12 min   RN requesting pt to ambulate w/ new off unit orders, pt agreeable. Required no physical assistance during ambulation. HR up to 113 bpm w/ exertion. Some dyspnea displayed, however SPO2 WFL. No c/o when asked. Pt returned back to sitting on EoB and left with all needs met, call bell in reach.   Lauraine Erm Mobility Specialist Please contact via SecureChat or [employer] [phone]

## 2024-07-06 NOTE — Interval H&P Note (Signed)
 History and Physical Interval Note:  07/06/2024 2:59 PM  Carolyn Oconnor  has presented today for surgery, with the diagnosis of hf.  The various methods of treatment have been discussed with the patient and family. After consideration of risks, benefits and other options for treatment, the patient has consented to  Procedure(s): RIGHT HEART CATH (N/A) as a surgical intervention.  The patient's history has been reviewed, patient examined, no change in status, stable for surgery.  I have reviewed the patient's chart and labs.  Questions were answered to the patient's satisfaction.     Carolyn Oconnor

## 2024-07-06 NOTE — Plan of Care (Signed)
   Problem: Education: Goal: Knowledge of General Education information will improve Description: Including pain rating scale, medication(s)/side effects and non-pharmacologic comfort measures Outcome: Progressing   Problem: Health Behavior/Discharge Planning: Goal: Ability to manage health-related needs will improve Outcome: Progressing   Problem: Nutrition: Goal: Adequate nutrition will be maintained Outcome: Progressing

## 2024-07-06 NOTE — Progress Notes (Addendum)
 Advanced Heart Failure Rounding Note  HF Cardiologist: Dr. Cherrie  Chief Complaint: CHF  Subjective:    CO-OX 59% this am. Not currently on inotrope support.  Lasix  gtt stopped yesterday. CVP 6-7. Weight down another 5 lbs, doubt Is/Os are accurate. No dyspnea, orthopnea or PND.   Going for RHC today.  Objective:    Weight Range: 103.6 kg Body mass index is 43.16 kg/m.   Vital Signs:   Temp:  [97.9 F (36.6 C)-98.5 F (36.9 C)] 98.3 F (36.8 C) (09/08 0804) Pulse Rate:  [96-107] 96 (09/08 0430) Resp:  [16-20] 19 (09/08 0804) BP: (102-119)/(73-88) 112/78 (09/08 0804) SpO2:  [90 %-98 %] 95 % (09/08 0815) Weight:  [103.6 kg] 103.6 kg (09/08 0500) Last BM Date : 07/03/24  Weight change: Filed Weights   07/04/24 0412 07/05/24 0505 07/06/24 0500  Weight: 110.1 kg 105.9 kg 103.6 kg   Intake/Output:  Intake/Output Summary (Last 24 hours) at 07/06/2024 0842 Last data filed at 07/06/2024 0430 Gross per 24 hour  Intake 460.67 ml  Output 1800 ml  Net -1339.33 ml    Physical Exam    General:  Well appearing. Sitting up on side of bed.  Cor: JVP ~ 7 cm. Regular rate & rhythm. No murmurs. Lungs: clear Abdomen: obese, soft, nontender, nondistended.  Extremities: no edema Neuro: alert & orientedx3. Affect pleasant    Telemetry   SR/ST 90s-100s, ~ 5 PVCs/min, up to 20/min at times  Labs    CBC Recent Labs    07/05/24 0407 07/06/24 0436  WBC 8.5 6.7  HGB 15.6* 16.3*  HCT 47.3* 49.7*  MCV 82.7 82.8  PLT 309 336   Basic Metabolic Panel Recent Labs    90/92/74 0407 07/06/24 0436  NA 137 136  K 3.2* 3.5  CL 95* 96*  CO2 28 26  GLUCOSE 112* 85  BUN 18 21*  CREATININE 1.09* 0.92  CALCIUM 8.9 8.7*  MG 2.1 2.2   BNP (last 3 results) Recent Labs    01/31/24 1225 07/01/24 2030 07/03/24 0348  BNP 819.2* 929.8* 1,495.4*   Hemoglobin A1C No results for input(s): HGBA1C in the last 72 hours.  Fasting Lipid Panel No results for input(s):  CHOL, HDL, LDLCALC, TRIG, CHOLHDL, LDLDIRECT in the last 72 hours.  Thyroid Function Tests No results for input(s): TSH, T4TOTAL, T3FREE, THYROIDAB in the last 72 hours.  Invalid input(s): FREET3  Medications:     Scheduled Medications:  budesonide  (PULMICORT ) nebulizer solution  0.25 mg Nebulization BID   Chlorhexidine  Gluconate Cloth  6 each Topical Daily   dapagliflozin  propanediol  10 mg Oral Daily   digoxin   0.125 mg Oral Daily   enoxaparin  (LOVENOX ) injection  60 mg Subcutaneous Q24H   guaiFENesin   600 mg Oral BID   montelukast   10 mg Oral QHS   multivitamin with minerals  1 tablet Oral Daily   sodium chloride  flush  10-40 mL Intracatheter Q12H   spironolactone   25 mg Oral Daily   torsemide   60 mg Oral Daily    Infusions:    PRN Medications: acetaminophen  **OR** acetaminophen , baclofen , benzonatate , ipratropium-albuterol , melatonin, morphine  injection, polyethylene glycol, prochlorperazine , sodium chloride  flush  Patient Profile   Ms. Bouldin is a 48 y.o. AAF w/ h/o asthma, obesity and systolic heart failure.   Assessment/Plan   1. Acute on Chronic Systolic Heart Failure, NICM - Diagnosed in 4/24 - Echo 4/24: EF 20-25% (looks closer to 30-35%), RV hyperdynamic  - R/LHC 5/24: no CAD, RA 13,  PA 59/42 (48), PCW 33, LVEDP 35. Fick CO 6.13, CI 2.87. TD CO/CI: 6.3/2.9. PAPi 1.3 - cMRI 5/24: LVEF 25%, RVEF 42%. NICM.  - Echo 10/24 EF 30-35% RV ok (limited images)  - Echo this admission with EF <20%, G2DD and severe RV dysfunction, RSVP 44 - not taking meds regularly prior to admission.  - Diuresed well. CVP 6-7. Now on 60 mg Torsemide  daily (prior home dose). - Continue spiro 25 mg daily  - Continue digoxin  0.125 mg daily  - Continue Farxiga  10 mg daily - Hold on ? blocker for now (bisoprolol  at home) - Add losartan  12.5 mg daily - Cardiac rehab consult - Likely will need advanced therapies soon but limited by weight, RV dysfunction and limited  insight into her treatment plan. RHC planned for today   2. Morbid obesity - Body mass index is 43.16 kg/m. - Has seen PharmD for GLP1RA. A1C 6.1   3. H/o NSVT - more PVCs today, follow burden   4. Suspect OSA/OHS - sleep study ordered, has not completed  5. Asthma/Cough - CXR 9/3 with chronic peribronchial/interstitial thickening and pulmonary edema - may need PFTs in outpatient setting - PRN tesslon pearls and nebs - cough likely exacerbated by volume overload  6. Hypokalemia - supp K today  Length of Stay: 4  Shiraz Bastyr N, PA-C  07/06/2024, 8:42 AM  Advanced Heart Failure Team Pager [phone] (M-F; 7a - 5p)  Please contact [facility] for night-coverage after hours (5p -7a ) and weekends on [email or web address]

## 2024-07-07 ENCOUNTER — Other Ambulatory Visit (HOSPITAL_COMMUNITY): Payer: Self-pay

## 2024-07-07 ENCOUNTER — Encounter (HOSPITAL_COMMUNITY): Payer: Self-pay | Admitting: Cardiology

## 2024-07-07 LAB — CBC
HCT: 48.4 % — ABNORMAL HIGH (ref 36.0–46.0)
Hemoglobin: 16 g/dL — ABNORMAL HIGH (ref 12.0–15.0)
MCH: 27.3 pg (ref 26.0–34.0)
MCHC: 33.1 g/dL (ref 30.0–36.0)
MCV: 82.6 fL (ref 80.0–100.0)
Platelets: 313 K/uL (ref 150–400)
RBC: 5.86 MIL/uL — ABNORMAL HIGH (ref 3.87–5.11)
RDW: 14.7 % (ref 11.5–15.5)
WBC: 7.3 K/uL (ref 4.0–10.5)
nRBC: 0 % (ref 0.0–0.2)

## 2024-07-07 LAB — COOXEMETRY PANEL
Carboxyhemoglobin: 1.8 % — ABNORMAL HIGH (ref 0.5–1.5)
Methemoglobin: <0.7 % (ref 0.0–1.5)
O2 Saturation: 59.9 %
Total hemoglobin: 16.6 g/dL — ABNORMAL HIGH (ref 12.0–16.0)

## 2024-07-07 LAB — BASIC METABOLIC PANEL WITH GFR
Anion gap: 15 (ref 5–15)
BUN: 32 mg/dL — ABNORMAL HIGH (ref 6–20)
CO2: 24 mmol/L (ref 22–32)
Calcium: 8.9 mg/dL (ref 8.9–10.3)
Chloride: 97 mmol/L — ABNORMAL LOW (ref 98–111)
Creatinine, Ser: 1.41 mg/dL — ABNORMAL HIGH (ref 0.44–1.00)
GFR, Estimated: 47 mL/min — ABNORMAL LOW (ref >60)
Glucose, Bld: 88 mg/dL (ref 70–99)
Potassium: 4 mmol/L (ref 3.5–5.1)
Sodium: 136 mmol/L (ref 135–145)

## 2024-07-07 LAB — MAGNESIUM: Magnesium: 2.2 mg/dL (ref 1.7–2.4)

## 2024-07-07 MED ORDER — ADULT MULTIVITAMIN W/MINERALS CH
1.0000 | ORAL_TABLET | Freq: Every day | ORAL | 5 refills | Status: AC
  Filled 2024-07-07 – 2024-08-06 (×2): qty 30, 30d supply, fill #0
  Filled 2024-09-10: qty 30, 30d supply, fill #1
  Filled 2024-10-26 (×2): qty 30, 30d supply, fill #2

## 2024-07-07 MED ORDER — LOSARTAN POTASSIUM 25 MG PO TABS
12.5000 mg | ORAL_TABLET | Freq: Every day | ORAL | 5 refills | Status: DC
  Filled 2024-07-07: qty 15, 30d supply, fill #0
  Filled 2024-07-30 (×2): qty 30, 60d supply, fill #0

## 2024-07-07 MED ORDER — TORSEMIDE 20 MG PO TABS
40.0000 mg | ORAL_TABLET | Freq: Every day | ORAL | 11 refills | Status: AC
  Filled 2024-07-07: qty 60, 30d supply, fill #0
  Filled 2024-08-06: qty 20, 10d supply, fill #0
  Filled 2024-08-06: qty 40, 20d supply, fill #0
  Filled 2024-08-10: qty 60, 30d supply, fill #0
  Filled 2024-09-10: qty 60, 30d supply, fill #1
  Filled 2024-10-26 (×2): qty 60, 30d supply, fill #2

## 2024-07-07 MED ORDER — DAPAGLIFLOZIN PROPANEDIOL 10 MG PO TABS
10.0000 mg | ORAL_TABLET | Freq: Every day | ORAL | 6 refills | Status: AC
  Filled 2024-07-07: qty 30, 30d supply, fill #0

## 2024-07-07 MED ORDER — TORSEMIDE 20 MG PO TABS: 60.0000 mg | ORAL_TABLET | Freq: Every day | ORAL | Status: DC

## 2024-07-07 MED ORDER — SPIRONOLACTONE 25 MG PO TABS
25.0000 mg | ORAL_TABLET | Freq: Every day | ORAL | 5 refills | Status: AC
  Filled 2024-07-07 – 2024-08-06 (×2): qty 30, 30d supply, fill #0
  Filled 2024-09-10: qty 30, 30d supply, fill #1
  Filled 2024-10-26 (×2): qty 30, 30d supply, fill #2

## 2024-07-07 MED ORDER — MONTELUKAST SODIUM 10 MG PO TABS
10.0000 mg | ORAL_TABLET | Freq: Every day | ORAL | 5 refills | Status: AC
  Filled 2024-07-07 – 2024-08-06 (×2): qty 30, 30d supply, fill #0
  Filled 2024-09-10: qty 30, 30d supply, fill #1
  Filled 2024-10-26 (×2): qty 30, 30d supply, fill #2

## 2024-07-07 MED ORDER — DIGOXIN 125 MCG PO TABS
0.1250 mg | ORAL_TABLET | Freq: Every day | ORAL | 5 refills | Status: AC
  Filled 2024-07-07 – 2024-08-10 (×3): qty 30, 30d supply, fill #0
  Filled 2024-09-10: qty 30, 30d supply, fill #1
  Filled 2024-10-26 (×4): qty 30, 30d supply, fill #2

## 2024-07-07 MED ORDER — TORSEMIDE 20 MG PO TABS
60.0000 mg | ORAL_TABLET | Freq: Every day | ORAL | 11 refills | Status: DC
  Filled 2024-07-07: qty 90, 30d supply, fill #0

## 2024-07-07 NOTE — Progress Notes (Signed)
 Nurse giving discharge report to pt as I entered, I quickly explained the purpose of CR and importance of CR at [facility]. Pt was given brochure and HF book.

## 2024-07-07 NOTE — Plan of Care (Signed)

## 2024-07-07 NOTE — Plan of Care (Signed)
  Problem: Education: Goal: Knowledge of General Education information will improve Description: Including pain rating scale, medication(s)/side effects and non-pharmacologic comfort measures Outcome: Adequate for Discharge   Problem: Health Behavior/Discharge Planning: Goal: Ability to manage health-related needs will improve Outcome: Adequate for Discharge   Problem: Clinical Measurements: Goal: Ability to maintain clinical measurements within normal limits will improve Outcome: Adequate for Discharge Goal: Will remain free from infection Outcome: Adequate for Discharge Goal: Diagnostic test results will improve Outcome: Adequate for Discharge Goal: Respiratory complications will improve Outcome: Adequate for Discharge Goal: Cardiovascular complication will be avoided Outcome: Adequate for Discharge   Problem: Activity: Goal: Risk for activity intolerance will decrease Outcome: Adequate for Discharge   Problem: Nutrition: Goal: Adequate nutrition will be maintained Outcome: Adequate for Discharge   Problem: Coping: Goal: Level of anxiety will decrease Outcome: Adequate for Discharge   Problem: Elimination: Goal: Will not experience complications related to bowel motility Outcome: Adequate for Discharge Goal: Will not experience complications related to urinary retention Outcome: Adequate for Discharge   Problem: Pain Managment: Goal: General experience of comfort will improve and/or be controlled Outcome: Adequate for Discharge   Problem: Safety: Goal: Ability to remain free from injury will improve Outcome: Adequate for Discharge   Problem: Skin Integrity: Goal: Risk for impaired skin integrity will decrease Outcome: Adequate for Discharge   Problem: Education: Goal: Ability to demonstrate management of disease process will improve Outcome: Adequate for Discharge Goal: Ability to verbalize understanding of medication therapies will improve Outcome: Adequate  for Discharge Goal: Individualized Educational Video(s) Outcome: Adequate for Discharge   Problem: Activity: Goal: Capacity to carry out activities will improve Outcome: Adequate for Discharge   Problem: Cardiac: Goal: Ability to achieve and maintain adequate cardiopulmonary perfusion will improve Outcome: Adequate for Discharge   Problem: Education: Goal: Understanding of CV disease, CV risk reduction, and recovery process will improve Outcome: Adequate for Discharge Goal: Individualized Educational Video(s) Outcome: Adequate for Discharge   Problem: Activity: Goal: Ability to return to baseline activity level will improve Outcome: Adequate for Discharge   Problem: Cardiovascular: Goal: Ability to achieve and maintain adequate cardiovascular perfusion will improve Outcome: Adequate for Discharge Goal: Vascular access site(s) Level 0-1 will be maintained Outcome: Adequate for Discharge   Problem: Health Behavior/Discharge Planning: Goal: Ability to safely manage health-related needs after discharge will improve Outcome: Adequate for Discharge

## 2024-07-07 NOTE — TOC Transition Note (Addendum)
 Transition of Care [facility]) - Discharge Note   Patient Details  Name: Carolyn Oconnor MRN: [id number] Date of Birth: 28-Jan-1976  Transition of Care [facility]) CM/SW Contact:  Justina Delcia Czar, RN Phone Number: [phone] 07/07/2024, 10:02 AM   Clinical Narrative:     Scheduled hospital follow up appt with PCP on 07/09/2024 at 10:20 am. Patient will transport home by taxi. Consent signed and placed on chart. Provided pt with note for work.   Spoke to pt and states she works full-time. Pt reports she will need note for work. Pt reports she has scale at home for daily. Education was completed on diet, low sodium/heart healthy. Pt states they do eat out frequently. Pt agreeable to seeing dietician. Message sent to attending.    Provided pt with Living better with HF booklet.  Patient referrals were sent for outpatient Cardiac Rehab and Dietician.  Final next level of care: Home/Self Care Barriers to Discharge: No Barriers Identified   Patient Goals and CMS Choice Patient states their goals for this hospitalization and ongoing recovery are:: wants to get better    Discharge Placement     Discharge Plan and Services Additional resources added to the After Visit Summary for     Discharge Planning Services: CM Consult              Social Drivers of Health (SDOH) Interventions SDOH Screenings   Food Insecurity: No Food Insecurity (07/02/2024)  Housing: Low Risk  (07/02/2024)  Transportation Needs: No Transportation Needs (07/02/2024)  Utilities: Not At Risk (07/02/2024)  Alcohol Screen: Low Risk  (06/10/2024)  Depression (PHQ2-9): Low Risk  (04/12/2023)  Financial Resource Strain: Low Risk  (06/10/2024)  Physical Activity: Sufficiently Active (06/10/2024)  Social Connections: Moderately Isolated (06/10/2024)  Stress: Stress Concern Present (06/10/2024)  Tobacco Use: Medium Risk (07/01/2024)     Readmission Risk Interventions     No data to display

## 2024-07-07 NOTE — Progress Notes (Signed)
 PICC line removed per order. Vaseline gauze, gauze and tegaderm utilized. Pressure held until hemostasis achieved. Pt was instructed to lay flat for 30 minutes post removal. Educated on S&S of complications at removal site. Primary nurse notified.

## 2024-07-08 ENCOUNTER — Telehealth: Payer: Self-pay

## 2024-07-08 NOTE — Transitions of Care (Post Inpatient/ED Visit) (Signed)
   07/08/2024  Name: Carolyn Oconnor MRN: [id number] DOB: 05/21/1976  Today's TOC FU Call Status: Today's TOC FU Call Status:: Unsuccessful Call (1st Attempt) Unsuccessful Call (1st Attempt) Date: 07/08/24  Attempted to reach the patient regarding the most recent Inpatient/ED visit.Patient answered but said not a good time to talk and agreed to call tomorrow  Follow Up Plan: Additional outreach attempts will be made to reach the patient to complete the Transitions of Care (Post Inpatient/ED visit) call.   Shona Prow RN, CCM [place]  VBCI-Population Health RN Care Manager [phone]

## 2024-07-08 NOTE — Progress Notes (Deleted)
 [facility] clinic  Provider:  Jereld Serum DNP  Code Status:  Full Code  Goals of Care:     07/02/2024    6:25 AM  Advanced Directives  Would patient like information on creating a medical advance directive? No - Patient declined     No chief complaint on file.  Discussed the use of AI scribe software for clinical note transcription with the patient, who gave verbal consent to proceed.  HPI: Patient is a 48 y.o. female seen today for hospital follow up.  She was hospitalized 07/01/24 to 07/07/24 for acute on chronic systolic heart failure. She presented to the ER with shortness of breath. She was non compliant with her medications.  Echo with LVEF <20% and severely reduced RV. She was diuresed. Torsemide  40 mg daily Spironolactone  25 mg daily Digoxin  0.125 mg daily Losartan  12.5 mg daily Hold Bisoprolol  for now  Past Medical History:  Diagnosis Date   Allergic rhinitis    Asthma    Bronchitis, chronic (HCC)    CHF (congestive heart failure) (HCC)    Pneumonia     Past Surgical History:  Procedure Laterality Date   none     RIGHT HEART CATH N/A 07/06/2024   Procedure: RIGHT HEART CATH;  Surgeon: Zenaida Morene PARAS, MD;  Location: [facility] INVASIVE CV LAB;  Service: Cardiovascular;  Laterality: N/A;   RIGHT/LEFT HEART CATH AND CORONARY ANGIOGRAPHY N/A 03/01/2023   Procedure: RIGHT/LEFT HEART CATH AND CORONARY ANGIOGRAPHY;  Surgeon: Cherrie Toribio SAUNDERS, MD;  Location: [facility] INVASIVE CV LAB;  Service: Cardiovascular;  Laterality: N/A;    Allergies  Allergen Reactions   Aspirin      Patient states that it makes her go into an asthma attack.     Outpatient Encounter Medications as of 07/09/2024  Medication Sig   acetaminophen  (TYLENOL ) 325 MG tablet Take 2 tablets (650 mg total) by mouth every 6 (six) hours as needed for mild pain (or Fever >/= 101). (Patient taking differently: Take 325-650 mg by mouth every 6 (six) hours as needed for mild pain (pain score 1-3) (or Fever >/= 101).)    albuterol  (PROVENTIL  HFA;VENTOLIN  HFA) 108 (90 Base) MCG/ACT inhaler Inhale 2 puffs into the lungs every 4 (four) hours as needed (cough and wheeze). For asthma attacks.   budesonide -formoterol  (SYMBICORT ) 80-4.5 MCG/ACT inhaler Inhale 2 puffs into the lungs 2 (two) times daily as needed.   dapagliflozin  propanediol (FARXIGA ) 10 MG TABS tablet Take 1 tablet (10 mg total) by mouth daily before breakfast.   digoxin  (LANOXIN ) 0.125 MG tablet Take 1 tablet (0.125 mg total) by mouth daily.   fluticasone  (FLONASE ) 50 MCG/ACT nasal spray Place 2 sprays into both nostrils 2 (two) times daily as needed for allergies or rhinitis.   losartan  (COZAAR ) 25 MG tablet Take 0.5 tablets (12.5 mg total) by mouth daily.   montelukast  (SINGULAIR ) 10 MG tablet Take 1 tablet (10 mg total) by mouth at bedtime.   Multiple Vitamin (MULTIVITAMIN WITH MINERALS) TABS tablet Take 1 tablet by mouth daily.   potassium chloride  SA (KLOR-CON  M) 20 MEQ tablet Take 2 tablets (40 mEq total) by mouth daily. (Patient taking differently: Take 20 mEq by mouth 2 (two) times daily.)   spironolactone  (ALDACTONE ) 25 MG tablet Take 1 tablet (25 mg total) by mouth daily.   torsemide  (DEMADEX ) 20 MG tablet Take 2 tablets (40 mg total) by mouth daily.   No facility-administered encounter medications on file as of 07/09/2024.    Review of Systems:  Review of  Systems  Health Maintenance  Topic Date Due   DTaP/Tdap/Td (1 - Tdap) Never done   Hepatitis B Vaccines 19-59 Average Risk (1 of 3 - 19+ 3-dose series) Never done   Cervical Cancer Screening (HPV/Pap Cotest)  Never done   Mammogram  Never done   Colonoscopy  Never done   COVID-19 Vaccine (1 - 2024-25 season) Never done   Pneumococcal Vaccine  Completed   Influenza Vaccine  Completed   Hepatitis C Screening  Completed   HIV Screening  Completed   HPV VACCINES  Aged Out   Meningococcal B Vaccine  Aged Out    Physical Exam: There were no vitals filed for this visit. There is  no height or weight on file to calculate BMI. Physical Exam  Labs reviewed: Basic Metabolic Panel: Recent Labs    07/03/24 0348 07/03/24 1521 07/05/24 0407 07/06/24 0436 07/06/24 1548 07/06/24 1555 07/07/24 0455  NA 135   < > 137 136 141 138  138 136  K 3.9   < > 3.2* 3.5 3.4* 3.8  3.8 4.0  CL 102   < > 95* 96*  --   --  97*  CO2 22   < > 28 26  --   --  24  GLUCOSE 123*   < > 112* 85  --   --  88  BUN 14   < > 18 21*  --   --  32*  CREATININE 1.08*   < > 1.09* 0.92  --   --  1.41*  CALCIUM 9.2   < > 8.9 8.7*  --   --  8.9  MG 2.4   < > 2.1 2.2  --   --  2.2  TSH 1.363  --   --   --   --   --   --    < > = values in this interval not displayed.   Liver Function Tests: No results for input(s): AST, ALT, ALKPHOS, BILITOT, PROT, ALBUMIN in the last 8760 hours. No results for input(s): LIPASE, AMYLASE in the last 8760 hours. No results for input(s): AMMONIA in the last 8760 hours. CBC: Recent Labs    07/05/24 0407 07/06/24 0436 07/06/24 1548 07/06/24 1555 07/07/24 0455  WBC 8.5 6.7  --   --  7.3  HGB 15.6* 16.3* 17.0* 17.7*  17.7* 16.0*  HCT 47.3* 49.7* 50.0* 52.0*  52.0* 48.4*  MCV 82.7 82.8  --   --  82.6  PLT 309 336  --   --  313   Lipid Panel: Recent Labs    07/03/24 0348  CHOL 133  HDL 28*  LDLCALC 94  TRIG 57  CHOLHDL 4.8   Lab Results  Component Value Date   HGBA1C 6.1 (H) 07/03/2024    Procedures since last visit: CARDIAC CATHETERIZATION Result Date: 07/07/2024 HEMODYNAMICS: RA:       2 mmHg (mean) RV:       38/1, 2 mmHg PA:       38/21 mmHg (27 mean) PCWP: 13 mmHg (mean)    Estimated Fick CO/CI   5.72L/min, 2.86L/min/m2 Thermodilution CO/CI   5.42L/min, 2.71L/min/m2    TPG  14  mmHg     PVR  2.6 Wood Units PAPi  8.5  IMPRESSION: Right heart catheterization for evaluation of pulmonary artery pressures and cardiac output Normal filling pressures Well compensated cardiac index by TD Mildly elevated pulmonary artery pressures,  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  ECHOCARDIOGRAM COMPLETE Result Date: 07/02/2024    ECHOCARDIOGRAM REPORT   Patient Name:   Carolyn Oconnor Date of Exam: 07/02/2024 Medical Rec #:  [id number]       Height:       61.0 in Accession #:    [id number]      Weight:       258.1 lb Date of Birth:  06/25/1976      BSA:          2.105 m Patient Age:    47 years        BP:           94/73 mmHg Patient Gender: F               HR:           97 bpm. Exam Location:  Inpatient Procedure: 2D Echo, Cardiac Doppler, Color Doppler and Intracardiac            Opacification Agent (Both Spectral and Color Flow Doppler were            utilized during procedure). Indications:    CHF I50.31  History:        Patient has prior history of Echocardiogram examinations, most                 recent 08/22/2023. CHF.  Sonographer:    Thea Norlander RCS Referring Phys: [id number] KATY L FOUST IMPRESSIONS  1. Left ventricular ejection fraction, by estimation, is <20%. The left ventricle has severely decreased function. The left ventricle demonstrates global hypokinesis. The left ventricular internal cavity size was moderately dilated. Left ventricular diastolic parameters are consistent with Grade II diastolic dysfunction (pseudonormalization).  2. Right ventricular systolic function is severely reduced. The right ventricular size is moderately enlarged. There is mildly elevated pulmonary artery systolic pressure. The estimated right ventricular systolic pressure is 44.4 mmHg.  3. Left atrial size was moderately dilated.  4. Right atrial size was moderately dilated.  5. The mitral valve is normal in structure. Mild to moderate mitral valve regurgitation. No evidence of mitral stenosis.  6.  Tricuspid valve regurgitation is severe.  7. The aortic valve is tricuspid. There is mild calcification of the aortic valve. Aortic valve regurgitation is not visualized. No aortic stenosis is present.  8. The inferior vena cava is dilated in size with <50% respiratory variability, suggesting right atrial pressure of 15 mmHg. FINDINGS  Left Ventricle: Left ventricular ejection fraction, by estimation, is <20%. The left ventricle has severely decreased function. The left ventricle demonstrates global hypokinesis. Definity  contrast agent was given IV to delineate the left ventricular endocardial borders. The left ventricular internal cavity size was moderately dilated. There is no left ventricular hypertrophy. Left ventricular diastolic parameters are consistent with Grade II diastolic dysfunction (pseudonormalization). Right Ventricle: The right ventricular size is moderately enlarged. No increase in right ventricular wall thickness. Right ventricular systolic function is severely reduced. There is mildly elevated pulmonary artery systolic pressure. The tricuspid regurgitant velocity is 2.71 m/s, and with an assumed right atrial pressure of 15 mmHg, the estimated right ventricular systolic pressure is 44.4 mmHg. Left Atrium: Left atrial size was moderately dilated. Right Atrium: Right atrial size was moderately dilated. Pericardium: Trivial pericardial effusion is present. The pericardial effusion  is posterior to the left ventricle. Mitral Valve: The mitral valve is normal in structure. Mild to moderate mitral valve regurgitation. No evidence of mitral valve stenosis. Tricuspid Valve: The tricuspid valve is normal in structure. Tricuspid valve regurgitation is severe. No evidence of tricuspid stenosis. Aortic Valve: The aortic valve is tricuspid. There is mild calcification of the aortic valve. Aortic valve regurgitation is not visualized. No aortic stenosis is present. Aortic valve peak gradient measures 5.0 mmHg.  Pulmonic Valve: The pulmonic valve was normal in structure. Pulmonic valve regurgitation is not visualized. No evidence of pulmonic stenosis. Aorta: The aortic root is normal in size and structure. Venous: The inferior vena cava is dilated in size with less than 50% respiratory variability, suggesting right atrial pressure of 15 mmHg. IAS/Shunts: No atrial level shunt detected by color flow Doppler.  LEFT VENTRICLE PLAX 2D LVIDd:         6.33 cm   Diastology LVIDs:         5.76 cm   LV e' medial:    4.50 cm/s LV PW:         1.14 cm   LV E/e' medial:  26.4 LV IVS:        0.95 cm   LV e' lateral:   14.30 cm/s LVOT diam:     1.94 cm   LV E/e' lateral: 8.3 LV SV:         30 LV SV Index:   14 LVOT Area:     2.96 cm  RIGHT VENTRICLE            IVC RV S prime:     6.05 cm/s  IVC diam: 2.67 cm TAPSE (M-mode): 1.7 cm LEFT ATRIUM             Index        RIGHT ATRIUM           Index LA diam:        5.11 cm 2.43 cm/m   RA Area:     21.30 cm LA Vol (A2C):   62.7 ml 29.79 ml/m  RA Volume:   70.00 ml  33.25 ml/m LA Vol (A4C):   70.6 ml 33.54 ml/m LA Biplane Vol: 69.3 ml 32.92 ml/m  AORTIC VALVE AV Area (Vmax): 2.07 cm AV Vmax:        112.00 cm/s AV Peak Grad:   5.0 mmHg LVOT Vmax:      78.40 cm/s LVOT Vmean:     49.667 cm/s LVOT VTI:       0.100 m  AORTA Ao Root diam: 2.85 cm Ao Asc diam:  2.78 cm MITRAL VALVE                TRICUSPID VALVE MV Area (PHT): 6.83 cm     TR Peak grad:   29.4 mmHg MV Decel Time: 111 msec     TR Vmax:        271.00 cm/s MV E velocity: 119.00 cm/s MV A velocity: 79.10 cm/s   SHUNTS MV E/A ratio:  1.50         Systemic VTI:  0.10 m                             Systemic Diam: 1.94 cm Toribio Fuel MD Electronically signed by Toribio Fuel MD Signature Date/Time: 07/02/2024/6:27:31 PM    Final    DG Chest 2 View Result Date: 07/01/2024 CLINICAL DATA:  Shortness of breath.  Cough. EXAM: CHEST - 2 VIEW COMPARISON:  06/17/2023 FINDINGS: Cardiomegaly is unchanged. Mediastinal contours are stable.  Chronic peribronchial/interstitial thickening. No acute airspace disease. No pleural effusion. No pneumothorax. No acute osseous findings. IMPRESSION: Unchanged cardiomegaly. Chronic peribronchial/interstitial thickening. No acute findings. Electronically Signed   By: Andrea Gasman M.D.   On: 07/01/2024 21:10    Assessment/Plan Assessment and Plan Assessment & Plan     ***   Labs/tests ordered:  * No order type specified *   No follow-ups on file.  Jahmia Berrett Medina-Vargas, NP

## 2024-07-09 ENCOUNTER — Telehealth: Payer: Self-pay

## 2024-07-09 ENCOUNTER — Telehealth (HOSPITAL_COMMUNITY): Payer: Self-pay

## 2024-07-09 ENCOUNTER — Inpatient Hospital Stay: Admitting: Adult Health

## 2024-07-09 DIAGNOSIS — I5021 Acute systolic (congestive) heart failure: Secondary | ICD-10-CM

## 2024-07-09 DIAGNOSIS — J4550 Severe persistent asthma, uncomplicated: Secondary | ICD-10-CM

## 2024-07-09 NOTE — Transitions of Care (Post Inpatient/ED Visit) (Signed)
   07/09/2024  Name: Carolyn Oconnor MRN: [id number] DOB: [birth year]  Today's TOC FU Call Status: Today's TOC FU Call Status:: Unsuccessful Call (2nd Attempt) Unsuccessful Call (2nd Attempt) Date: 07/09/24  Attempted to reach the patient regarding the most recent Inpatient/ED visit.  Follow Up Plan: Additional outreach attempts will be made to reach the patient to complete the Transitions of Care (Post Inpatient/ED visit) call.   Shona Prow RN, CCM [facility]  VBCI-Population Health RN Care Manager [phone]

## 2024-07-09 NOTE — TOC CM/SW Note (Signed)
..  07/09/2024 late note  Carolyn Oconnor DOB: 17-Dec-1975 MRN: [id number]   RIDER WAIVER AND RELEASE OF LIABILITY  For the purposes of helping with transportation needs, [facility] partners with outside transportation providers (taxi companies, [place], Catering manager.) to give [facility] patients or other approved people the choice of on-demand rides Public librarian) to our buildings for non-emergency visits.  By using [employer], I, the person signing this document, on behalf of myself and/or any legal minors (in my care using the [employer]), agree:  Science writer given to me are supplied by independent, outside transportation providers who do not work for, or have any affiliation with, [employer]. [place] is not a transportation company. [facility] has no control over the quality or safety of the rides I get using [employer]. [facility] has no control over whether any outside ride will happen on time or not. [facility] gives no guarantee on the reliability, quality, safety, or availability on any rides, or that no mistakes will happen. I know and accept that traveling by vehicle (car, truck, SVU, fleeta, bus, taxi, etc.) has risks of serious injuries such as disability, being paralyzed, and death. I know and agree the risk of using [employer] is mine alone, and not [employer]. [employer] are provided as is and as are available. The transportation providers are in charge for all inspections and care of the vehicles used to provide these rides. I agree not to take legal action against [place], its agents, employees, officers, directors, representatives, insurers, attorneys, assigns, successors, subsidiaries, and affiliates at any time for any reasons related directly or indirectly to using [employer]. I also agree not to take legal action against [facility] or its affiliates for any injury, death, or damage to property caused by or  related to using [employer]. I have read this Waiver and Release of Liability, and I understand the terms used in it and their legal meaning. This Waiver is freely and voluntarily given with the understanding that my right (or any legal minors) to legal action against [facility] relating to [employer] is knowingly given up to use these services.   I attest that I read the Ride Waiver and Release of Liability to Carolyn Oconnor, gave Ms. Sanagustin the opportunity to ask questions and answered the questions asked (if any). I affirm that Carolyn Oconnor then provided consent for assistance with transportation.    Patient provided taxi voucher, she reviewed liability form and signed. Copy placed on chart, and provided to pt.

## 2024-07-09 NOTE — Telephone Encounter (Signed)
 Attempted to call patient in regards to Cardiac Rehab - LM on VM

## 2024-07-10 ENCOUNTER — Telehealth: Payer: Self-pay

## 2024-07-10 NOTE — Progress Notes (Signed)
 ADVANCED HF CLINIC NOTE  Primary Care: Medina-Vargas, Jereld BROCKS, NP HF Cardiologist: Dr. Cherrie   HPI: Carolyn Oconnor is a 49 y.o. AAF w/ h/o asthma, obesity and recently diagnosed systolic heart failure.   Admitted 4/30- 03/04/23 with acute systolic HF. Presented to the ED w/ several weeks progressive SOB, orthopnea and b/l LE. Also felt to have component of acute asthma exacerbation. Diuresed with IV Lasix , started prednisone  and bronchodilators. 2D echo was completed and showed severely reduced LVEF 20-25%, mild- mod MR, RV hyperdynamic. L/RHC: normal coronaries, marked volume overload with persevered CO. cMRI 5/24: LVEF 25%, RVEF 42%. Non ischemic cardiomyopathy. GDMT started. Discharge weight 248 lbs.   Admitted 9/25 with a/c HF. Echo showed EF 20-25%, severely reduced RV. Diuresed with IV lasix .Underwent RHC showing normal filling pressures and well compensated CI. GDMT titrated and she was discharged home, weight 231 lbs.  Today she returns for HF follow up. Overall feeling fine. 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 Denies palpitations, abnormal bleeding, CP, dizziness, edema, or PND/Orthopnea. Appetite ok. Weight at home 227 pounds. Taking all medications. She works as a Lawyer at [employer], also Theatre stage manager. No ETOH, tobacco or drug use.  Cardiac studies:  - RHC 9/25: RA 2, PA 38/21 (27), PCWP 13, CO/CI (Fick) 5.72/2.86, PVR 2.6 WU, PAPi 8.5  - Echo 9/25: EF < 20%, G2DD, RV severely reduced, mild to moderate MR, severe TR  - Echo 08/22/23 EF ~30-35% RV ok (limited images)   - L/RHC 5/24: Normal cors. RA 13, PA 59/42 (48), PCWP 33, CO/CI (Fick) 6.2/2.9, PVR 2.4 WU, PAPi 1.3  - cMRI 5/24: LVEF 25%, RVEF 42%. Non ischemic cardiomyopathy.  - Echo 4/24: LVEF 20-25%, mild- mod MR, RV hyperdynamic.   Past Medical History:  Diagnosis Date   Allergic rhinitis    Asthma    Bronchitis, chronic (HCC)    CHF (congestive heart failure) (HCC)     Pneumonia     Current Outpatient Medications  Medication Sig Dispense Refill   acetaminophen  (TYLENOL ) 325 MG tablet Take 2 tablets (650 mg total) by mouth every 6 (six) hours as needed for mild pain (or Fever >/= 101). (Patient taking differently: Take 325-650 mg by mouth every 6 (six) hours as needed for mild pain (pain score 1-3) (or Fever >/= 101).) 20 tablet 0   albuterol  (PROVENTIL  HFA;VENTOLIN  HFA) 108 (90 Base) MCG/ACT inhaler Inhale 2 puffs into the lungs every 4 (four) hours as needed (cough and wheeze). For asthma attacks. 1 Inhaler 3   budesonide -formoterol  (SYMBICORT ) 80-4.5 MCG/ACT inhaler Inhale 2 puffs into the lungs 2 (two) times daily as needed.     dapagliflozin  propanediol (FARXIGA ) 10 MG TABS tablet Take 1 tablet (10 mg total) by mouth daily before breakfast. 30 tablet 6   digoxin  (LANOXIN ) 0.125 MG tablet Take 1 tablet (0.125 mg total) by mouth daily. 30 tablet 5   fluticasone  (FLONASE ) 50 MCG/ACT nasal spray Place 2 sprays into both nostrils 2 (two) times daily as needed for allergies or rhinitis.     losartan  (COZAAR ) 25 MG tablet Take 0.5 tablets (12.5 mg total) by mouth daily. 30 tablet 5   Multiple Vitamin (MULTIVITAMIN WITH MINERALS) TABS tablet Take 1 tablet by mouth daily. 30 tablet 5   potassium chloride  SA (KLOR-CON  M) 20 MEQ tablet Take 2 tablets (40 mEq total) by mouth daily. 180 tablet 3   spironolactone  (ALDACTONE ) 25 MG tablet Take 1 tablet (25 mg total) by mouth  daily. 30 tablet 5   torsemide  (DEMADEX ) 20 MG tablet Take 2 tablets (40 mg total) by mouth daily. 90 tablet 11   montelukast  (SINGULAIR ) 10 MG tablet Take 1 tablet (10 mg total) by mouth at bedtime. (Patient not taking: Reported on 07/14/2024) 30 tablet 5   No current facility-administered medications for this encounter.   Allergies  Allergen Reactions   Aspirin      Patient states that it makes her go into an asthma attack.    Social History   Socioeconomic History   Marital status:  Significant Other    Spouse name: Not on file   Number of children: 0   Years of education: Not on file   Highest education level: Associate degree: academic program  Occupational History   Occupation: [employer]  Tobacco Use   Smoking status: Never    Passive exposure: Yes   Smokeless tobacco: Never   Tobacco comments:    Secondhand from Father  Vaping Use   Vaping status: Never Used  Substance and Sexual Activity   Alcohol use: No    Alcohol/week: 0.0 standard drinks of alcohol   Drug use: No    Frequency: 1.0 times per week   Sexual activity: Not on file  Other Topics Concern   Not on file  Social History Narrative   Single, lives with boyfriend   CNA, Editor, commissioning   No recent travel      Adult nurse Pulmonary:   Originally from [place]. Always lived in [place]. Previously hasn't traveled outside of the state. 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 No pets currently. She does have carpet & draperies in her home as well as the bedroom. No indoor plants. No bird exposure. No mold exposure. Enjoys drawing & reading.       Diet: Left Blank      Caffeine: Yes      Married, Single if yes what year:       Do you live in a house, apartment, assisted living, condo, trailer, ect: apartment      Is it one or more stories: Left Blank      How many persons live in your home? 2      Pets: no      Highest level or education completed: [employer]      Current/Past profession: Computer      Exercise:  Yes                Type and how often: 2 time a day         Living Will: Yes   DNR: Yes   POA/HPOA: Yes      Functional Status:   Do you have difficulty bathing or dressing yourself? No   Do you have difficulty preparing food or eating? No   Do you have difficulty managing your medications? No   Do you have difficulty managing your finances? No   Do you have difficulty affording your medications? No   Social Drivers of Manufacturing engineer Strain: Low Risk  (06/10/2024)   Overall Financial Resource Strain (CARDIA)    Difficulty of Paying Living Expenses: Not hard at all  Food Insecurity: No Food Insecurity (07/02/2024)   Hunger Vital Sign    Worried About Running Out of Food in the Last Year: Never true    Ran Out of Food in the Last Year: Never true  Transportation Needs: No Transportation  Needs (07/02/2024)   PRAPARE - Administrator, Civil Service (Medical): No    Lack of Transportation (Non-Medical): No  Physical Activity: Sufficiently Active (06/10/2024)   Exercise Vital Sign    Days of Exercise per Week: 3 days    Minutes of Exercise per Session: 70 min  Stress: Stress Concern Present (06/10/2024)   [employer] of Occupational Health - Occupational Stress Questionnaire    Feeling of Stress: To some extent  Social Connections: Moderately Isolated (06/10/2024)   Social Connection and Isolation Panel    Frequency of Communication with Friends and Family: Once a week    Frequency of Social Gatherings with Friends and Family: Once a week    Attends Religious Services: Never    Database administrator or Organizations: Yes    Attends Banker Meetings: 1 to 4 times per year    Marital Status: Living with partner  Intimate Partner Violence: Not At Risk (07/02/2024)   Humiliation, Afraid, Rape, and Kick questionnaire    Fear of Current or Ex-Partner: No    Emotionally Abused: No    Physically Abused: No    Sexually Abused: No     Family History  Problem Relation Age of Onset   Allergies Mother    Heart disease Mother    Allergies Father    Asthma Father    Diabetes Sister    Asthma Sister    Allergies Sister    Cancer Sister    Asthma Brother    Allergies Brother    Heart disease Maternal Aunt    Breast cancer Maternal Aunt     BP 110/74   Pulse 91   Ht 5' 1 (1.549 m)   Wt 105.8 kg (233 lb 3.2 oz)   LMP 07/05/2024 (Exact Date) Comment: Spotting  SpO2 96%    BMI 44.06 kg/m   Wt Readings from Last 3 Encounters:  07/14/24 105.8 kg (233 lb 3.2 oz)  07/07/24 104.8 kg (231 lb)  06/10/24 117.4 kg (258 lb 12.8 oz)   PHYSICAL EXAM: General:  NAD. No resp difficulty, walked into clinic HEENT: Normal Neck: Supple. No JVD. Cor: Regular rate & rhythm. No rubs, gallops or murmurs. Lungs: Clear Abdomen: Soft, nontender, nondistended.  Extremities: No cyanosis, clubbing, rash, edema Neuro: Alert & oriented x 3, moves all 4 extremities w/o difficulty. Affect pleasant.  ECG (personally reviewed): NSR with 1 PVC 95 bpm  ASSESSMENT & PLAN: 1. Chronic Systolic Heart Failure - NICM, diagnosed 01/2023. - Etiology uncertain. - Echo EF 20-25% (looks closer to 30-35%), RV hyperdynamic  - R/LHC 5/24: no CAD, RA 13, PA 59/42 (48), PCW 33, LV EDP 35. Fick CO 6.13, CI 2.87. Thermo CO/CI: 6.3/2.9. PAPi 1.3 - cMRI 5/24: LVEF 25%, RVEF 42%. Non ischemic cardiomyopathy.  - Echo 08/22/23 EF 30-35% RV ok (limited images)  - Echo 9/25: EF < 20%, G2DD, severe RV dysfunction, RVSP 44 - RHC 9/25: normal filling pressures and well compensated CI at 2.86 - NYHA I-II, volume ok - Continue torsemide  40 mg daily - Continue spiro 25 mg daily  - Continue losartan  12.5 mg daily. Consider switch to Entresto next visit. - Continue digoxin  0.125 mg daily. Check dig trough next visit. - Continue Farxiga  10. Denies GU symptoms  - Labs today. - Referred to CR - Likely will need advanced therapies soon but limited by weight, noncompliance, RV dysfunction and limited insight into her treatment plan    2. Morbid obesity - Body mass  index is 44.06 kg/m. - Has seen PharmD for GLP1RA. Refer back to discuss options   3. H/o NSVT - Continue GDMT - Needs sleep study - Labs today  4. Snoring - Suspect sleep apnea - Arrange sleep study  Follow up in 2 weeks with APP and 3 months with Dr Cherrie  Harlene CHRISTELLA Gainer, FNP  10:46 AM

## 2024-07-10 NOTE — Transitions of Care (Post Inpatient/ED Visit) (Signed)
   07/10/2024  Name: GABRYEL TALAMO MRN: [id number] DOB: 07/01/1976  Today's TOC FU Call Status: Today's TOC FU Call Status:: Unsuccessful Call (3rd Attempt) Unsuccessful Call (3rd Attempt) Date: 07/10/24  Attempted to reach the patient regarding the most recent Inpatient/ED visit.  Follow Up Plan: No further outreach attempts will be made at this time. We have been unable to contact the patient.  Shona Prow RN, CCM [place]  VBCI-Population Health RN Care Manager [phone]

## 2024-07-13 ENCOUNTER — Telehealth (HOSPITAL_COMMUNITY): Payer: Self-pay | Admitting: *Deleted

## 2024-07-13 NOTE — Telephone Encounter (Signed)
 Called to confirm/remind patient of their appointment at the [facility] on  07/14/24.      Appointment:              [] Confirmed             [x] Left mess              [] No answer/No voice mail             [] Phone not in service   Patient reminded to bring all medications and/or complete list.   Confirmed patient has transportation. Gave directions, instructed to utilize valet parking.

## 2024-07-14 ENCOUNTER — Ambulatory Visit (HOSPITAL_COMMUNITY)
Admission: RE | Admit: 2024-07-14 | Discharge: 2024-07-14 | Disposition: A | Discharge status: Home / Self Care | Source: Ambulatory Visit | Attending: Family Medicine | Admitting: Family Medicine

## 2024-07-14 ENCOUNTER — Encounter (HOSPITAL_COMMUNITY): Payer: Self-pay

## 2024-07-14 ENCOUNTER — Other Ambulatory Visit (HOSPITAL_COMMUNITY): Payer: Self-pay

## 2024-07-14 VITALS — BP 110/74 | HR 91 | Ht 61.0 in | Wt 233.2 lb

## 2024-07-14 DIAGNOSIS — I4729 Other ventricular tachycardia: Secondary | ICD-10-CM

## 2024-07-14 DIAGNOSIS — I428 Other cardiomyopathies: Secondary | ICD-10-CM | POA: Diagnosis not present

## 2024-07-14 DIAGNOSIS — Z79899 Other long term (current) drug therapy: Secondary | ICD-10-CM | POA: Insufficient documentation

## 2024-07-14 DIAGNOSIS — Z91199 Patient's noncompliance with other medical treatment and regimen due to unspecified reason: Secondary | ICD-10-CM | POA: Insufficient documentation

## 2024-07-14 DIAGNOSIS — I5022 Chronic systolic (congestive) heart failure: Secondary | ICD-10-CM | POA: Diagnosis not present

## 2024-07-14 DIAGNOSIS — R0683 Snoring: Secondary | ICD-10-CM | POA: Diagnosis not present

## 2024-07-14 DIAGNOSIS — Z6841 Body Mass Index (BMI) 40.0 and over, adult: Secondary | ICD-10-CM | POA: Insufficient documentation

## 2024-07-14 DIAGNOSIS — Z8249 Family history of ischemic heart disease and other diseases of the circulatory system: Secondary | ICD-10-CM | POA: Insufficient documentation

## 2024-07-14 DIAGNOSIS — I493 Ventricular premature depolarization: Secondary | ICD-10-CM | POA: Insufficient documentation

## 2024-07-14 DIAGNOSIS — J45909 Unspecified asthma, uncomplicated: Secondary | ICD-10-CM | POA: Insufficient documentation

## 2024-07-14 NOTE — Patient Instructions (Addendum)
 There has been no changes to your medications.  Labs done today, your results will be available in MyChart, we will contact you for abnormal readings.   PLEASE MAKE SURE TO ATTEND YOUR SLEEP STUDY TEST.  PLEASE MAKE SURE TO HOLD YOUR DIGOXIN  BEFORE YOUR NEXT APPOINTMENT IN 2 WEEKS.  Your physician recommends that you schedule a follow-up appointment in: 2 WEEKS.  If you have any questions or concerns before your next appointment please send us  a message through [place] or call our office at [phone].    TO LEAVE A MESSAGE FOR THE NURSE SELECT OPTION 2, PLEASE LEAVE A MESSAGE INCLUDING: YOUR NAME DATE OF BIRTH CALL BACK NUMBER REASON FOR CALL**this is important as we prioritize the call backs  YOU WILL RECEIVE A CALL BACK THE SAME DAY AS LONG AS YOU CALL BEFORE 4:00 PM  At the [facility], you and your health needs are our priority. As part of our continuing mission to provide you with exceptional heart care, we have created designated Provider Care Teams. These Care Teams include your primary Cardiologist (physician) and Advanced Practice Providers (APPs- Physician Assistants and Nurse Practitioners) who all work together to provide you with the care you need, when you need it.   You may see any of the following providers on your designated Care Team at your next follow up: Dr Toribio Fuel Dr Ezra Shuck Dr. Ria Commander Dr. Morene Brownie Amy Lenetta, NP Caffie Shed, [place] [facility], [place] Beckey Coe, NP [place] Lee, NP Ellouise Class, NP Tinnie Redman, PharmD Jaun Bash, PharmD   Please be sure to bring in all your medications bottles to every appointment.    Thank you for choosing [facility]

## 2024-07-15 ENCOUNTER — Ambulatory Visit (HOSPITAL_COMMUNITY): Payer: Self-pay | Admitting: Family Medicine

## 2024-07-16 ENCOUNTER — Telehealth: Payer: Self-pay | Admitting: *Deleted

## 2024-07-16 ENCOUNTER — Telehealth (HOSPITAL_COMMUNITY): Payer: Self-pay

## 2024-07-16 NOTE — Telephone Encounter (Signed)
 Attempted to call patient regarding cardiac rehab- no answer, left message. Sent MyChart message.

## 2024-07-16 NOTE — Telephone Encounter (Signed)
 Pt insurance is active and benefits verified through [employer]. Co-pay $0, DED $3,500/$0 met, out of pocket $5,500/$0 met, co-insurance 20%. Pre-authorization required for TCR & ICR codes. 07-17-2024 @ 12:27pm, spoke with Andi, REF# [id number] 07/17/24.  TCR/ICR? ICR Visit(date of service)limitation? No Can multiple codes be used on the same date of service/visit?(IF ITS A LIMIT) N/A  Is this a lifetime maximum or an annual maximum? Annual Has the member used any of these services to date? No Is there a time limit (weeks/months) on start of program and/or program completion? No   *If patient confirms interest in program, will reach out to Dr. Nelle office regarding prior authorization request. [employer] prior auth # to call is [id number].

## 2024-07-16 NOTE — Telephone Encounter (Signed)
 Spoke to patient on Tuesday about the COMET study. She told me she was off on Friday and I told her I would call her today to see if she wanted to come in to see me.  Left message.   Carolyn Oconnor

## 2024-07-30 ENCOUNTER — Telehealth (HOSPITAL_COMMUNITY): Payer: Self-pay

## 2024-07-30 ENCOUNTER — Other Ambulatory Visit (HOSPITAL_COMMUNITY): Payer: Self-pay

## 2024-07-30 ENCOUNTER — Other Ambulatory Visit: Payer: Self-pay

## 2024-07-30 NOTE — Telephone Encounter (Signed)
 Attempted f/u call regarding cardiac rehab- no answer, left message. Sent MyChart message.  Closing referral.

## 2024-08-03 NOTE — Progress Notes (Incomplete)
 ADVANCED HF CLINIC NOTE  Primary Care: Medina-Vargas, Jereld BROCKS, NP HF Cardiologist: Dr. Cherrie   HPI: Carolyn Oconnor is a 48 y.o. AAF w/ h/o asthma, obesity and recently diagnosed systolic heart failure.   Admitted 4/30- 03/04/23 with acute systolic HF. Presented to the ED w/ several weeks progressive SOB, orthopnea and b/l LE. Also felt to have component of acute asthma exacerbation. Diuresed with IV Lasix , started prednisone  and bronchodilators. 2D echo was completed and showed severely reduced LVEF 20-25%, mild- mod MR, RV hyperdynamic. L/RHC: normal coronaries, marked volume overload with persevered CO. cMRI 5/24: LVEF 25%, RVEF 42%. Non ischemic cardiomyopathy. GDMT started. Discharge weight 248 lbs.   Admitted 9/25 with a/c HF. Echo showed EF 20-25%, severely reduced RV. Diuresed with IV lasix .Underwent RHC showing normal filling pressures and well compensated CI. GDMT titrated and she was discharged home, weight 231 lbs.  Today she returns for HF follow up. Overall feeling fine. 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 Denies palpitations, abnormal bleeding, CP, dizziness, edema, or PND/Orthopnea. Appetite ok. Weight at home 227 pounds. Taking all medications. She works as a Lawyer at [employer], also Theatre stage manager. No ETOH, tobacco or drug use.  Cardiac studies:  - RHC 9/25: RA 2, PA 38/21 (27), PCWP 13, CO/CI (Fick) 5.72/2.86, PVR 2.6 WU, PAPi 8.5  - Echo 9/25: EF < 20%, G2DD, RV severely reduced, mild to moderate MR, severe TR  - Echo 08/22/23 EF ~30-35% RV ok (limited images)   - L/RHC 5/24: Normal cors. RA 13, PA 59/42 (48), PCWP 33, CO/CI (Fick) 6.2/2.9, PVR 2.4 WU, PAPi 1.3  - cMRI 5/24: LVEF 25%, RVEF 42%. Non ischemic cardiomyopathy.  - Echo 4/24: LVEF 20-25%, mild- mod MR, RV hyperdynamic.   Past Medical History:  Diagnosis Date   Allergic rhinitis    Asthma    Bronchitis, chronic (HCC)    CHF (congestive heart failure) (HCC)     Pneumonia     Current Outpatient Medications  Medication Sig Dispense Refill   acetaminophen  (TYLENOL ) 325 MG tablet Take 2 tablets (650 mg total) by mouth every 6 (six) hours as needed for mild pain (or Fever >/= 101). (Patient taking differently: Take 325-650 mg by mouth every 6 (six) hours as needed for mild pain (pain score 1-3) (or Fever >/= 101).) 20 tablet 0   albuterol  (PROVENTIL  HFA;VENTOLIN  HFA) 108 (90 Base) MCG/ACT inhaler Inhale 2 puffs into the lungs every 4 (four) hours as needed (cough and wheeze). For asthma attacks. 1 Inhaler 3   budesonide -formoterol  (SYMBICORT ) 80-4.5 MCG/ACT inhaler Inhale 2 puffs into the lungs 2 (two) times daily as needed.     dapagliflozin  propanediol (FARXIGA ) 10 MG TABS tablet Take 1 tablet (10 mg total) by mouth daily before breakfast. 30 tablet 6   digoxin  (LANOXIN ) 0.125 MG tablet Take 1 tablet (0.125 mg total) by mouth daily. 30 tablet 5   fluticasone  (FLONASE ) 50 MCG/ACT nasal spray Place 2 sprays into both nostrils 2 (two) times daily as needed for allergies or rhinitis.     losartan  (COZAAR ) 25 MG tablet Take 0.5 tablets (12.5 mg total) by mouth daily. 30 tablet 5   montelukast  (SINGULAIR ) 10 MG tablet Take 1 tablet (10 mg total) by mouth at bedtime. (Patient not taking: Reported on 07/14/2024) 30 tablet 5   Multiple Vitamin (MULTIVITAMIN WITH MINERALS) TABS tablet Take 1 tablet by mouth daily. 30 tablet 5   potassium chloride  SA (KLOR-CON  M) 20 MEQ tablet Take  2 tablets (40 mEq total) by mouth daily. 180 tablet 3   spironolactone  (ALDACTONE ) 25 MG tablet Take 1 tablet (25 mg total) by mouth daily. 30 tablet 5   torsemide  (DEMADEX ) 20 MG tablet Take 2 tablets (40 mg total) by mouth daily. 90 tablet 11   No current facility-administered medications for this visit.   Allergies  Allergen Reactions   Aspirin      Patient states that it makes her go into an asthma attack.    Social History   Socioeconomic History   Marital status: Significant  Other    Spouse name: Not on file   Number of children: 0   Years of education: Not on file   Highest education level: Associate degree: academic program  Occupational History   Occupation: [employer]  Tobacco Use   Smoking status: Never    Passive exposure: Yes   Smokeless tobacco: Never   Tobacco comments:    Secondhand from Father  Vaping Use   Vaping status: Never Used  Substance and Sexual Activity   Alcohol use: No    Alcohol/week: 0.0 standard drinks of alcohol   Drug use: No    Frequency: 1.0 times per week   Sexual activity: Not on file  Other Topics Concern   Not on file  Social History Narrative   Single, lives with boyfriend   CNA, Editor, commissioning   No recent travel      Adult nurse Pulmonary:   Originally from [place]. Always lived in [place]. Previously hasn't traveled outside of the state. 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 No pets currently. She does have carpet & draperies in her home as well as the bedroom. No indoor plants. No bird exposure. No mold exposure. Enjoys drawing & reading.       Diet: Left Blank      Caffeine: Yes      Married, Single if yes what year:       Do you live in a house, apartment, assisted living, condo, trailer, ect: apartment      Is it one or more stories: Left Blank      How many persons live in your home? 2      Pets: no      Highest level or education completed: [employer]      Current/Past profession: Computer      Exercise:  Yes                Type and how often: 2 time a day         Living Will: Yes   DNR: Yes   POA/HPOA: Yes      Functional Status:   Do you have difficulty bathing or dressing yourself? No   Do you have difficulty preparing food or eating? No   Do you have difficulty managing your medications? No   Do you have difficulty managing your finances? No   Do you have difficulty affording your medications? No   Social Drivers of Research scientist (physical sciences) Strain: Low Risk  (06/10/2024)   Overall Financial Resource Strain (CARDIA)    Difficulty of Paying Living Expenses: Not hard at all  Food Insecurity: No Food Insecurity (07/02/2024)   Hunger Vital Sign    Worried About Running Out of Food in the Last Year: Never true    Ran Out of Food in the Last Year: Never true  Transportation Needs: No Transportation  Needs (07/02/2024)   PRAPARE - Administrator, Civil Service (Medical): No    Lack of Transportation (Non-Medical): No  Physical Activity: Sufficiently Active (06/10/2024)   Exercise Vital Sign    Days of Exercise per Week: 3 days    Minutes of Exercise per Session: 70 min  Stress: Stress Concern Present (06/10/2024)   [employer] of Occupational Health - Occupational Stress Questionnaire    Feeling of Stress: To some extent  Social Connections: Moderately Isolated (06/10/2024)   Social Connection and Isolation Panel    Frequency of Communication with Friends and Family: Once a week    Frequency of Social Gatherings with Friends and Family: Once a week    Attends Religious Services: Never    Database administrator or Organizations: Yes    Attends Banker Meetings: 1 to 4 times per year    Marital Status: Living with partner  Intimate Partner Violence: Not At Risk (07/02/2024)   Humiliation, Afraid, Rape, and Kick questionnaire    Fear of Current or Ex-Partner: No    Emotionally Abused: No    Physically Abused: No    Sexually Abused: No     Family History  Problem Relation Age of Onset   Allergies Mother    Heart disease Mother    Allergies Father    Asthma Father    Diabetes Sister    Asthma Sister    Allergies Sister    Cancer Sister    Asthma Brother    Allergies Brother    Heart disease Maternal Aunt    Breast cancer Maternal Aunt     LMP 07/05/2024 (Exact Date) Comment: Spotting  Wt Readings from Last 3 Encounters:  07/14/24 105.8 kg (233 lb 3.2 oz)  07/07/24 104.8 kg (231 lb)   06/10/24 117.4 kg (258 lb 12.8 oz)   PHYSICAL EXAM: General:  NAD. No resp difficulty, walked into clinic HEENT: Normal Neck: Supple. No JVD. Cor: Regular rate & rhythm. No rubs, gallops or murmurs. Lungs: Clear Abdomen: Soft, nontender, nondistended.  Extremities: No cyanosis, clubbing, rash, edema Neuro: Alert & oriented x 3, moves all 4 extremities w/o difficulty. Affect pleasant.  ECG (personally reviewed): NSR with 1 PVC 95 bpm  ASSESSMENT & PLAN: 1. Chronic Systolic Heart Failure - NICM, diagnosed 01/2023. - Etiology uncertain. - Echo EF 20-25% (looks closer to 30-35%), RV hyperdynamic  - R/LHC 5/24: no CAD, RA 13, PA 59/42 (48), PCW 33, LV EDP 35. Fick CO 6.13, CI 2.87. Thermo CO/CI: 6.3/2.9. PAPi 1.3 - cMRI 5/24: LVEF 25%, RVEF 42%. Non ischemic cardiomyopathy.  - Echo 08/22/23 EF 30-35% RV ok (limited images)  - Echo 9/25: EF < 20%, G2DD, severe RV dysfunction, RVSP 44 - RHC 9/25: normal filling pressures and well compensated CI at 2.86 - NYHA I-II, volume ok - Continue torsemide  40 mg daily - Continue spiro 25 mg daily  - Continue losartan  12.5 mg daily. Consider switch to Entresto next visit. - Continue digoxin  0.125 mg daily. Check dig trough next visit. - Continue Farxiga  10. Denies GU symptoms  - Labs today. - Referred to CR - Likely will need advanced therapies soon but limited by weight, noncompliance, RV dysfunction and limited insight into her treatment plan    2. Morbid obesity - There is no height or weight on file to calculate BMI. - Has seen PharmD for GLP1RA. Refer back to discuss options   3. H/o NSVT - Continue GDMT - Needs sleep study -  Labs today  4. Snoring - Suspect sleep apnea - Arrange sleep study  Follow up in 2 weeks with APP and 3 months with Dr Cherrie  Harlene CHRISTELLA Gainer, FNP  4:35 PM

## 2024-08-04 ENCOUNTER — Encounter (HOSPITAL_COMMUNITY)

## 2024-08-04 ENCOUNTER — Telehealth (HOSPITAL_COMMUNITY): Payer: Self-pay

## 2024-08-04 NOTE — Telephone Encounter (Signed)
 Called to confirm/remind patient of their appointment at the [facility] on 08/05/24.   Appointment:   [x] Confirmed  [] Left mess   [] No answer/No voice mail  [] VM Full/unable to leave message  [] Phone not in service  Patient reminded to bring all medications and/or complete list.  Confirmed patient has transportation. Gave directions, instructed to utilize valet parking.

## 2024-08-05 ENCOUNTER — Encounter (HOSPITAL_COMMUNITY)

## 2024-08-05 ENCOUNTER — Telehealth (HOSPITAL_COMMUNITY): Payer: Self-pay

## 2024-08-05 NOTE — Telephone Encounter (Signed)
 Called to confirm/remind patient of their appointment at [facility] on 08/06/24.   Appointment:   [] Confirmed  [x] Left mess   [] No answer/No voice mail  [] VM Full/unable to leave message  [] Phone not in service  And to bring in all medications and/or complete list.

## 2024-08-06 ENCOUNTER — Ambulatory Visit (HOSPITAL_COMMUNITY)
Admission: RE | Admit: 2024-08-06 | Discharge: 2024-08-06 | Disposition: A | Discharge status: Home / Self Care | Source: Ambulatory Visit | Attending: Physician Assistant | Admitting: Physician Assistant

## 2024-08-06 ENCOUNTER — Other Ambulatory Visit (HOSPITAL_COMMUNITY): Payer: Self-pay

## 2024-08-06 ENCOUNTER — Encounter (HOSPITAL_COMMUNITY): Payer: Self-pay

## 2024-08-06 ENCOUNTER — Ambulatory Visit (HOSPITAL_COMMUNITY): Payer: Self-pay | Admitting: Physician Assistant

## 2024-08-06 VITALS — BP 118/82 | HR 86 | Ht 61.0 in | Wt 231.2 lb

## 2024-08-06 DIAGNOSIS — Z6841 Body Mass Index (BMI) 40.0 and over, adult: Secondary | ICD-10-CM | POA: Diagnosis not present

## 2024-08-06 DIAGNOSIS — I428 Other cardiomyopathies: Secondary | ICD-10-CM

## 2024-08-06 DIAGNOSIS — R0683 Snoring: Secondary | ICD-10-CM | POA: Diagnosis not present

## 2024-08-06 DIAGNOSIS — J45909 Unspecified asthma, uncomplicated: Secondary | ICD-10-CM | POA: Diagnosis not present

## 2024-08-06 DIAGNOSIS — I4729 Other ventricular tachycardia: Secondary | ICD-10-CM | POA: Diagnosis not present

## 2024-08-06 DIAGNOSIS — I5022 Chronic systolic (congestive) heart failure: Secondary | ICD-10-CM

## 2024-08-06 DIAGNOSIS — Z79899 Other long term (current) drug therapy: Secondary | ICD-10-CM | POA: Diagnosis not present

## 2024-08-06 DIAGNOSIS — Z7984 Long term (current) use of oral hypoglycemic drugs: Secondary | ICD-10-CM | POA: Diagnosis not present

## 2024-08-06 DIAGNOSIS — I5020 Unspecified systolic (congestive) heart failure: Secondary | ICD-10-CM | POA: Diagnosis present

## 2024-08-06 LAB — BRAIN NATRIURETIC PEPTIDE: B Natriuretic Peptide: 303.9 pg/mL — ABNORMAL HIGH (ref 0.0–100.0)

## 2024-08-06 LAB — BASIC METABOLIC PANEL WITH GFR
Anion gap: 11 (ref 5–15)
BUN: 18 mg/dL (ref 6–20)
CO2: 23 mmol/L (ref 22–32)
Calcium: 9 mg/dL (ref 8.9–10.3)
Chloride: 102 mmol/L (ref 98–111)
Creatinine, Ser: 0.9 mg/dL (ref 0.44–1.00)
GFR, Estimated: >60 mL/min (ref >60)
Glucose, Bld: 86 mg/dL (ref 70–99)
Potassium: 3.1 mmol/L — ABNORMAL LOW (ref 3.5–5.1)
Sodium: 136 mmol/L (ref 135–145)

## 2024-08-06 MED ORDER — LOSARTAN POTASSIUM 25 MG PO TABS
25.0000 mg | ORAL_TABLET | Freq: Every day | ORAL | 5 refills | Status: DC
  Filled 2024-08-06 – 2024-08-31 (×3): qty 30, 30d supply, fill #0

## 2024-08-06 NOTE — Progress Notes (Signed)
 ADVANCED [facility] CLINIC NOTE  Primary Care: Medina-Vargas, Jereld BROCKS, NP HF Cardiologist: Dr. Cherrie   HPI: Carolyn Oconnor is a 48 y.o. AAF w/ h/o asthma, obesity and recently diagnosed systolic heart failure.   Admitted 4/30- 03/04/23 with acute systolic HF. Presented to the ED w/ several weeks progressive SOB, orthopnea and b/l LE. Also felt to have component of acute asthma exacerbation. Diuresed with IV Lasix , started prednisone  and bronchodilators. 2D echo was completed and showed severely reduced LVEF 20-25%, mild- mod MR, RV hyperdynamic. L/RHC: normal coronaries, marked volume overload with persevered CO. cMRI 5/24: LVEF 25%, RVEF 42%. Non ischemic cardiomyopathy. GDMT started. Discharge weight 248 lbs.   Admitted 9/25 with a/c HF. Echo showed EF 20-25%, severely reduced RV. Diuresed with IV lasix .Underwent RHC showing normal filling pressures and well compensated CI. GDMT titrated and she was discharged home, weight 231 lbs.  Here today for CHF follow-up. Doing well from cardiac standpoint. Home weight stable at 225 lb. Taking all medications as prescribed. Back to work as a Lawyer. Able to walk halls at work without shortness of breath. Recovering from an upper respiratory infection but this has not significantly affected her breathing. No orthopnea, PND or lower extremity edema. Trying to eat better. Has an appointment with dietician later this month.  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 No ETOH, tobacco or drug use.  Cardiac studies:  - RHC 9/25: RA 2, PA 38/21 (27), PCWP 13, CO/CI (Fick) 5.72/2.86, PVR 2.6 WU, PAPi 8.5  - Echo 9/25: EF < 20%, G2DD, RV severely reduced, mild to moderate MR, severe TR  - Echo 08/22/23 EF ~30-35% RV ok (limited images)   - L/RHC 5/24: Normal cors. RA 13, PA 59/42 (48), PCWP 33, CO/CI (Fick) 6.2/2.9, PVR 2.4 WU, PAPi 1.3  - cMRI 5/24: LVEF 25%, RVEF 42%. Non ischemic cardiomyopathy.  - Echo 4/24: LVEF 20-25%, mild- mod  MR, RV hyperdynamic.   Past Medical History:  Diagnosis Date   Allergic rhinitis    Asthma    Bronchitis, chronic (HCC)    CHF (congestive heart failure) (HCC)    Pneumonia     Current Outpatient Medications  Medication Sig Dispense Refill   albuterol  (PROVENTIL  HFA;VENTOLIN  HFA) 108 (90 Base) MCG/ACT inhaler Inhale 2 puffs into the lungs every 4 (four) hours as needed (cough and wheeze). For asthma attacks. 1 Inhaler 3   budesonide -formoterol  (SYMBICORT ) 80-4.5 MCG/ACT inhaler Inhale 2 puffs into the lungs 2 (two) times daily as needed.     dapagliflozin  propanediol (FARXIGA ) 10 MG TABS tablet Take 1 tablet (10 mg total) by mouth daily before breakfast. 30 tablet 6   digoxin  (LANOXIN ) 0.125 MG tablet Take 1 tablet (0.125 mg total) by mouth daily. 30 tablet 5   fluticasone  (FLONASE ) 50 MCG/ACT nasal spray Place 2 sprays into both nostrils 2 (two) times daily as needed for allergies or rhinitis.     Multiple Vitamin (MULTIVITAMIN WITH MINERALS) TABS tablet Take 1 tablet by mouth daily. 30 tablet 5   potassium chloride  SA (KLOR-CON  M) 20 MEQ tablet Take 2 tablets (40 mEq total) by mouth daily. 180 tablet 3   spironolactone  (ALDACTONE ) 25 MG tablet Take 1 tablet (25 mg total) by mouth daily. 30 tablet 5   torsemide  (DEMADEX ) 20 MG tablet Take 2 tablets (40 mg total) by mouth daily. 90 tablet 11   acetaminophen  (TYLENOL ) 325 MG tablet Take 2 tablets (650 mg total) by mouth every 6 (six) hours as  needed for mild pain (or Fever >/= 101). (Patient taking differently: Take 325-650 mg by mouth every 6 (six) hours as needed for mild pain (pain score 1-3) (or Fever >/= 101).) 20 tablet 0   losartan  (COZAAR ) 25 MG tablet Take 1 tablet (25 mg total) by mouth daily. 30 tablet 5   montelukast  (SINGULAIR ) 10 MG tablet Take 1 tablet (10 mg total) by mouth at bedtime. (Patient not taking: Reported on 07/14/2024) 30 tablet 5   No current facility-administered medications for this encounter.   Allergies   Allergen Reactions   Aspirin      Patient states that it makes her go into an asthma attack.    Social History   Socioeconomic History   Marital status: Significant Other    Spouse name: Not on file   Number of children: 0   Years of education: Not on file   Highest education level: Associate degree: academic program  Occupational History   Occupation: [employer]  Tobacco Use   Smoking status: Never    Passive exposure: Yes   Smokeless tobacco: Never   Tobacco comments:    Secondhand from Father  Vaping Use   Vaping status: Never Used  Substance and Sexual Activity   Alcohol use: No    Alcohol/week: 0.0 standard drinks of alcohol   Drug use: No    Frequency: 1.0 times per week   Sexual activity: Not on file  Other Topics Concern   Not on file  Social History Narrative   Single, lives with boyfriend   CNA, Editor, commissioning   No recent travel      Adult nurse Pulmonary:   Originally from [place]. Always lived in [place]. Previously hasn't traveled outside of the state. 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 No pets currently. She does have carpet & draperies in her home as well as the bedroom. No indoor plants. No bird exposure. No mold exposure. Enjoys drawing & reading.       Diet: Left Blank      Caffeine: Yes      Married, Single if yes what year:       Do you live in a house, apartment, assisted living, condo, trailer, ect: apartment      Is it one or more stories: Left Blank      How many persons live in your home? 2      Pets: no      Highest level or education completed: [employer]      Current/Past profession: Computer      Exercise:  Yes                Type and how often: 2 time a day         Living Will: Yes   DNR: Yes   POA/HPOA: Yes      Functional Status:   Do you have difficulty bathing or dressing yourself? No   Do you have difficulty preparing food or eating? No   Do you have difficulty managing  your medications? No   Do you have difficulty managing your finances? No   Do you have difficulty affording your medications? No   Social Drivers of Corporate investment banker Strain: Low Risk  (06/10/2024)   Overall Financial Resource Strain (CARDIA)    Difficulty of Paying Living Expenses: Not hard at all  Food Insecurity: No Food Insecurity (07/02/2024)   Hunger Vital Sign  Worried About Programme researcher, broadcasting/film/video in the Last Year: Never true    Ran Out of Food in the Last Year: Never true  Transportation Needs: No Transportation Needs (07/02/2024)   PRAPARE - Administrator, Civil Service (Medical): No    Lack of Transportation (Non-Medical): No  Physical Activity: Sufficiently Active (06/10/2024)   Exercise Vital Sign    Days of Exercise per Week: 3 days    Minutes of Exercise per Session: 70 min  Stress: Stress Concern Present (06/10/2024)   [employer] of Occupational Health - Occupational Stress Questionnaire    Feeling of Stress: To some extent  Social Connections: Moderately Isolated (06/10/2024)   Social Connection and Isolation Panel    Frequency of Communication with Friends and Family: Once a week    Frequency of Social Gatherings with Friends and Family: Once a week    Attends Religious Services: Never    Database administrator or Organizations: Yes    Attends Banker Meetings: 1 to 4 times per year    Marital Status: Living with partner  Intimate Partner Violence: Not At Risk (07/02/2024)   Humiliation, Afraid, Rape, and Kick questionnaire    Fear of Current or Ex-Partner: No    Emotionally Abused: No    Physically Abused: No    Sexually Abused: No     Family History  Problem Relation Age of Onset   Allergies Mother    Heart disease Mother    Allergies Father    Asthma Father    Diabetes Sister    Asthma Sister    Allergies Sister    Cancer Sister    Asthma Brother    Allergies Brother    Heart disease Maternal Aunt    Breast cancer  Maternal Aunt     BP 118/82   Pulse 86   Ht 5' 1 (1.549 m)   Wt 104.9 kg (231 lb 3.2 oz)   LMP 07/05/2024 (Exact Date) Comment: Spotting  SpO2 91%   BMI 43.68 kg/m   Wt Readings from Last 3 Encounters:  08/06/24 104.9 kg (231 lb 3.2 oz)  07/14/24 105.8 kg (233 lb 3.2 oz)  07/07/24 104.8 kg (231 lb)   PHYSICAL EXAM: General:  Well appearing. Ambulated into clinic. Cor: No JVD. Regular rate & rhythm. No murmurs. Lungs: clear w/ scattered wheeze Abdomen: soft, nontender, nondistended.  Extremities: no edema Neuro: alert & orientedx3. Affect pleasant   ASSESSMENT & PLAN: 1. Chronic Systolic Heart Failure - NICM, diagnosed 01/2023. - Etiology uncertain. - Echo EF 20-25% (looks closer to 30-35%), RV hyperdynamic  - R/LHC 5/24: no CAD, RA 13, PA 59/42 (48), PCW 33, LV EDP 35. Fick CO 6.13, CI 2.87. Thermo CO/CI: 6.3/2.9. PAPi 1.3 - cMRI 5/24: LVEF 25%, RVEF 42%. Non ischemic cardiomyopathy.  - Echo 08/22/23 EF 30-35% RV ok (limited images)  - Echo 9/25: EF < 20%, G2DD, severe RV dysfunction, RVSP 44 - RHC 9/25: normal filling pressures and well compensated CI at 2.86 - NYHA II, volume looks good. Continue Torsemide  40 mg daily - Continue spiro 25 mg daily  - Increase losartan  to 25 mg daily. If tolerated, switch to Entresto next visit - Continue digoxin  0.125 mg daily. Check dig trough next visit. - Continue Farxiga  10. Denies GU symptoms  - Eventually add low-dose bisoprolol  - Labs today - CR has been unable to reach her. She was given the main # to call. - Likely will need advanced therapies soon  but limited by weight, noncompliance, RV dysfunction and limited insight into her treatment plan. She is actively trying to improve her health and has recently been more compliant with f/u.   2. Morbid obesity - Body mass index is 43.68 kg/m. - Has seen PharmD for [facility] in April. Uncertain if insurance would cover meds. Refer for another f/u.   3. H/o NSVT - Continue GDMT -  Needs sleep study, has appt  4. Snoring - Suspect sleep apnea - Sees Dr. Shlomo later this month for sleep evaluation  Follow up 4 weeks with APP for GDMT titration  Citlalli Weikel N, PA-C  2:34 PM

## 2024-08-06 NOTE — Patient Instructions (Addendum)
 Good to see you today!    INCREASE Losartan  to 25 mg daily  Labs done today, your results will be available in MyChart, we will contact you for abnormal reading  Your physician recommends that you schedule a follow-up appointment as  scheduled  You have been referred to pharmacy they will call to schedule an appointment  Cardiac rehab phone number [phone]  If you have any questions or concerns before your next appointment please send us  a message through [place] [place] or call our office at [phone].    TO LEAVE A MESSAGE FOR THE NURSE SELECT OPTION 2, PLEASE LEAVE A MESSAGE INCLUDING: YOUR NAME DATE OF BIRTH CALL BACK NUMBER REASON FOR CALL**this is important as we prioritize the call backs  YOU WILL RECEIVE A CALL BACK THE SAME DAY AS LONG AS YOU CALL BEFORE 4:00 PM At the [facility], you and your health needs are our priority. As part of our continuing mission to provide you with exceptional heart care, we have created designated Provider Care Teams. These Care Teams include your primary Cardiologist (physician) and Advanced Practice Providers (APPs- Physician Assistants and Nurse Practitioners) who all work together to provide you with the care you need, when you need it.   You may see any of the following providers on your designated Care Team at your next follow up: Dr Toribio Fuel Dr Ezra Shuck Dr. Ria Commander Dr. Morene Brownie Amy Lenetta, NP Caffie Shed, [place] [facility] [place], [place] Beckey Coe, NP [place] Lee, NP Ellouise Class, NP Tinnie Redman, PharmD Jaun Bash, PharmD   Please be sure to bring in all your medications bottles to every appointment.    Thank you for choosing [facility]

## 2024-08-10 ENCOUNTER — Other Ambulatory Visit: Payer: Self-pay

## 2024-08-10 ENCOUNTER — Other Ambulatory Visit (HOSPITAL_COMMUNITY): Payer: Self-pay

## 2024-08-10 ENCOUNTER — Other Ambulatory Visit (HOSPITAL_BASED_OUTPATIENT_CLINIC_OR_DEPARTMENT_OTHER): Payer: Self-pay

## 2024-08-11 ENCOUNTER — Ambulatory Visit: Admitting: Dietician

## 2024-08-18 ENCOUNTER — Encounter (HOSPITAL_COMMUNITY): Payer: Self-pay

## 2024-08-20 ENCOUNTER — Ambulatory Visit: Attending: Internal Medicine

## 2024-08-20 VITALS — Ht 62.0 in | Wt 229.0 lb

## 2024-08-20 DIAGNOSIS — E66813 Obesity, class 3: Secondary | ICD-10-CM | POA: Diagnosis not present

## 2024-08-20 NOTE — Patient Instructions (Signed)
 I will contact you once sleep study results are complete and we hear back from your insurance company.  Carolyn Oconnor, Pharm.D Carolyn Oconnor. [facility] [address], [place], [place] [address] Phone: [phone]; Fax: [phone]    GLP-1 Receptor Agonist Counseling Points This medication reduces your appetite and may make you feel fuller longer.  Stop eating when your body tells you that you are full. This will likely happen sooner than you are used to. Fried/greasy food and sweets may upset your stomach - minimize these as much as possible. Store your medication in the fridge until you are ready to use it. Inject your medication in the fatty tissue of your lower abdominal area (2 inches away from belly button) or upper outer thigh. Rotate injection sites. Common side effects include: nausea, diarrhea/constipation, and heartburn, and are more likely to occur if you overeat. Stop your injection for 7 days prior to surgical procedures requiring anesthesia.   Dosing schedule:  We will touch base with you monthly over the phone. The medication can be increased in monthly intervals depending on tolerability and efficacy.  Tips for success: Write down the reasons why you want to lose weight and post it in a place where you'll see it often.  Start small and work your way up. Keep in mind that it takes time to achieve goals, and small steps add up.  Any additional movements help to burn calories. Taking the stairs rather than the elevator and parking at the far end of your parking lot are easy ways to start. Brisk walking for at least 30 minutes 4 or more days of the week is an excellent goal to work toward  Understanding what it means to feel full: Did you know that it can take 15 minutes or more for your brain to receive the message that you've eaten? That means that, if you eat less food, but consume it slower, you may still feel  satisfied.  Eating a lot of fruits and vegetables can also help you feel fuller.  Eat off of smaller plates so that moderate portions don't seem too small  Tips for living a healthier life     Building a Healthy and Balanced Diet Make most of your meal vegetables and fruits -  of your plate. Aim for color and variety, and remember that potatoes don't count as vegetables on the Healthy Eating Plate because of their negative impact on blood sugar.  Go for whole grains -  of your plate. Whole and intact grains--whole wheat, barley, wheat berries, quinoa, oats, brown rice, and foods made with them, such as whole wheat pasta--have a milder effect on blood sugar and insulin than Thuan Tippett bread, Delmus Warwick rice, and other refined grains.  Protein power -  of your plate. Fish, poultry, beans, and nuts are all healthy, versatile protein sources--they can be mixed into salads, and pair well with vegetables on a plate. Limit red meat, and avoid processed meats such as bacon and sausage.  Healthy plant oils - in moderation. Choose healthy vegetable oils like olive, canola, soy, corn, sunflower, peanut, and others, and avoid partially hydrogenated oils, which contain unhealthy trans fats. Remember that low-fat does not mean "healthy."  Drink water, coffee, or tea. Skip sugary drinks, limit milk and dairy products to one to two servings per day, and limit juice to a small glass per day.  Stay active. The red figure running across the Healthy Eating  Plate's placemat is a reminder that staying active is also important in weight control.  The main message of the Healthy Eating Plate is to focus on diet quality:  The type of carbohydrate in the diet is more important than the amount of carbohydrate in the diet, because some sources of carbohydrate--like vegetables (other than potatoes), fruits, whole grains, and beans--are healthier than others. The Healthy Eating Plate also advises consumers to avoid  sugary beverages, a major source of calories--usually with little nutritional value--in the American diet. The Healthy Eating Plate encourages consumers to use healthy oils, and it does not set a maximum on the percentage of calories people should get each day from healthy sources of fat. In this way, the Healthy Eating Plate recommends the opposite of the low-fat message promoted for decades by the USDA.  [employer].ee  SUGAR  Sugar is a huge problem in the modern day diet. Sugar is a big contributor to heart disease, diabetes, high triglyceride levels, fatty liver disease and obesity. Sugar is hidden in almost all packaged foods/beverages. Added sugar is extra sugar that is added beyond what is naturally found and has no nutritional benefit for your body. The American Heart Association recommends limiting added sugars to no more than 25g for women and 36 grams for men per day. There are many names for sugar including maltose, sucrose (names ending in ose), high fructose corn syrup, molasses, cane sugar, corn sweetener, raw sugar, syrup, honey or fruit juice concentrate.   One of the best ways to limit your added sugars is to stop drinking sweetened beverages such as soda, sweet tea, and fruit juice.  There is 65g of added sugars in one 20oz bottle of Coke! That is equal to 7.5 donuts.   Pay attention and read all nutrition facts labels. Below is an examples of a nutrition facts label. The #1 is showing you the total sugars where the # 2 is showing you the added sugars. This one serving has almost the max amount of added sugars per day!   EXERCISE  Exercise is good. We've all heard that. In an ideal world, we would all have time and resources to get plenty of it. When you are active, your heart pumps more efficiently and you will feel better.  Multiple studies show that even walking regularly has benefits that include living a longer life. The  American Heart Association recommends 150 minutes per week of exercise (30 minutes per day most days of the week). You can do this in any increment you wish. Nine or more 10-minute walks count. So does an hour-long exercise class. Break the time apart into what will work in your life. Some of the best things you can do include walking briskly, jogging, cycling or swimming laps. Not everyone is ready to "exercise." Sometimes we need to start with just getting active. Here are some easy ways to be more active throughout the day:  Take the stairs instead of the elevator  Go for a 10-15 minute walk during your lunch break (find a friend to make it more enjoyable)  When shopping, park at the back of the parking lot  If you take public transportation, get off one stop early and walk the extra distance  Pace around while making phone calls  Check with your doctor if you aren't sure what your limitations may be. Always remember to drink plenty of water when doing any type of exercise. Don't feel like a failure if you're not getting the  90-150 minutes per week. If you started by being a couch potato, then just a 10-minute walk each day is a huge improvement. Start with little victories and work your way up.   HEALTHY EATING TIPS              Plan ahead: make a menu of the meals for a week then create a grocery list to go with that menu. Consider meals that easily stretch into a night of leftovers, such as stews or casseroles. Or consider making two of your favorite meal and put one in the freezer for another night. Try a night or two each week that is "meatless" or "no cook" such as salads. When you get home from the grocery store wash and prepare your vegetables and fruits. Then when you need them they are ready to go.   Tips for going to the grocery store:  Buy store or generic brands  Check the weekly ad from your store on-line or in their in-store flyer  Look at the unit price on the shelf tag to  compare/contrast the costs of different items  Buy fruits/vegetables in season  Carrots, bananas and apples are low-cost, naturally healthy items  If meats or frozen vegetables are on sale, buy some extras and put in your freezer  Limit buying prepared or "ready to eat" items, even if they are pre-made salads or fruit snacks  Do not shop when you're hungry  Foods at eye level tend to be more expensive. Look on the high and low shelves for deals.  Consider shopping at the farmer's market for fresh foods in season.  Avoid the cookie and chip aisles (these are expensive, high in calories and low in nutritional value). Shop on the outside of the grocery store.  Healthy food preparations:  If you can't get lean hamburger, be sure to drain the fat when cooking  Steam, saut (in olive oil), grill or bake foods  Experiment with different seasonings to avoid adding salt to your foods. Kosher salt, sea salt and Himalayan salt are all still salt and should be avoided. Try seasoning food with onion, garlic, thyme, rosemary, basil ect. Onion powder or garlic powder is ok. Avoid if it says salt (ie garlic salt).

## 2024-08-20 NOTE — Progress Notes (Signed)
 Patient ID: Carolyn Oconnor                 DOB: May 06, 1976                    MRN: [id number]     HPI: Carolyn Oconnor is a 48 y.o. female patient referred to pharmacy clinic by Manuelita Dutch, PA to initiate GLP1-RA therapy. PMH is significant for systolic HF (EF <20% on 06/2024), asthma, and morbid obesity. Most recent BMI 41.87 kg/m.   Of note, the patient was previously evaluated by another PharmD in April 2025 for initiation of a GLP-1 receptor agonist. Insurance denied coverage for [employer].   Patient presents today in good spirits. She reports two hospitalizations for HF, one last year and one approximately one month ago. Prior to her initial HF diagnosis last year, she reports her weight was ~ 270 lbs. Following her first hospitalization, she states she experienced a significant reduction in fluid, resulting in 20 lbs weight loss. She reports that she has lost some weight during her most recent hospital stay last month, presenting today at 229 lbs. Current meds that affect weight: Farxiga  and torsemide . She states that she was on furosemide  and switched to torsemide  which she believes is working much better.   She has tried to lose weight in the past but states that she's a good cook at that it's challenging to eat healthy. She reports no structured exercise outside of walking a lot at her job and cleaning. She is open to begin a structured exercise regimen (walking three times per week for 20 minutes).   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 She prepares meals in the oven, including french fries, pizza, and chicken. She does enjoy vegetables, including broccoli, cabbage, and okra but knows she should consume more.  Of note, she has no history of OSA but patient reports that she will undergo sleep study on 08/23/2024 in the hospital. She denies any history of heart attack or stroke. She denies hx of diabetes, (A1c 6.1 on 06/2024)  Pending results from the  upcoming sleep study, a request for approval of Zepbound will be submitted.  Discussed the cost self pay options of Zepbound (e.g [employer]) if insurance denies coverage. Patient expressed willingness to obtain directly through the manufacturer, if necessary.  Diet:  Breakfast: salads Lunch/Dinner: baked french fries, baked pizza, baked chicken, okra, boiled broccoli, cabbage Snacks: none  Drinks: 4 bottles of water per day  Exercise: no structured exercise  Family History:  Relation Problem Comments  Mother - Carolyn Oconnor (Deceased at age 86) Allergies   Heart disease     Father - Carolyn Oconnor [facility], age 20y) Allergies   Asthma     Sister - Carolyn Oconnor (Deceased at age 2) Allergies   Asthma   Diabetes     Sister - Carolyn Oconnor (Alive)   Sister - Carolyn Oconnor (Alive)   Sister - Carolyn Oconnor (Alive)   Sister - Carolyn Oconnor (Deceased at age 46) Cancer     Brother - Carolyn Oconnor (Alive) Allergies   Asthma     Brother - Carolyn Oconnor (Alive)   Brother - Carolyn PARAS. Oconnor (Alive)   Maternal Aunt Breast cancer   Heart disease      Social History:  Alcohol: none Smoking: none  Labs: Lab Results  Component Value Date   HGBA1C 6.1 (H) 07/03/2024    Wt Readings from Last 1  Encounters:  08/06/24 231 lb 3.2 oz (104.9 kg)    BP Readings from Last 1 Encounters:  08/06/24 118/82   Pulse Readings from Last 1 Encounters:  08/06/24 86       Component Value Date/Time   CHOL 133 07/03/2024 0348   TRIG 57 07/03/2024 0348   HDL 28 (L) 07/03/2024 0348   CHOLHDL 4.8 07/03/2024 0348   VLDL 11 07/03/2024 0348   LDLCALC 94 07/03/2024 0348   LDLCALC 128 (H) 04/12/2023 0958    Past Medical History:  Diagnosis Date   Allergic rhinitis    Asthma    Bronchitis, chronic (HCC)    CHF (congestive heart failure) (HCC)    Pneumonia     Current Outpatient Medications on File Prior to Visit  Medication Sig Dispense Refill   acetaminophen  (TYLENOL ) 325 MG tablet Take  2 tablets (650 mg total) by mouth every 6 (six) hours as needed for mild pain (or Fever >/= 101). (Patient taking differently: Take 325-650 mg by mouth every 6 (six) hours as needed for mild pain (pain score 1-3) (or Fever >/= 101).) 20 tablet 0   albuterol  (PROVENTIL  HFA;VENTOLIN  HFA) 108 (90 Base) MCG/ACT inhaler Inhale 2 puffs into the lungs every 4 (four) hours as needed (cough and wheeze). For asthma attacks. 1 Inhaler 3   budesonide -formoterol  (SYMBICORT ) 80-4.5 MCG/ACT inhaler Inhale 2 puffs into the lungs 2 (two) times daily as needed.     dapagliflozin  propanediol (FARXIGA ) 10 MG TABS tablet Take 1 tablet (10 mg total) by mouth daily before breakfast. 30 tablet 6   digoxin  (LANOXIN ) 0.125 MG tablet Take 1 tablet (0.125 mg total) by mouth daily. 30 tablet 5   fluticasone  (FLONASE ) 50 MCG/ACT nasal spray Place 2 sprays into both nostrils 2 (two) times daily as needed for allergies or rhinitis.     losartan  (COZAAR ) 25 MG tablet Take 1 tablet (25 mg total) by mouth daily. 30 tablet 5   montelukast  (SINGULAIR ) 10 MG tablet Take 1 tablet (10 mg total) by mouth at bedtime. (Patient not taking: Reported on 07/14/2024) 30 tablet 5   Multiple Vitamin (MULTIVITAMIN WITH MINERALS) TABS tablet Take 1 tablet by mouth daily. 30 tablet 5   potassium chloride  SA (KLOR-CON  M) 20 MEQ tablet Take 2 tablets (40 mEq total) by mouth daily. 180 tablet 3   spironolactone  (ALDACTONE ) 25 MG tablet Take 1 tablet (25 mg total) by mouth daily. 30 tablet 5   torsemide  (DEMADEX ) 20 MG tablet Take 2 tablets (40 mg total) by mouth daily. 90 tablet 11   No current facility-administered medications on file prior to visit.    Allergies  Allergen Reactions   Aspirin      Patient states that it makes her go into an asthma attack.    Assessment/Plan:  1. Weight loss -  Pharmacotherapy is appropriate to pursue as augmentation. Will start Zepbound. Confirmed patient not pregnant and no personal or family history of medullary  thyroid carcinoma (MTC) or Multiple Endocrine Neoplasia syndrome type 2 (MEN 2). Injection technique reviewed at today's visit.  Advised patient on common side effects including nausea, diarrhea, dyspepsia, decreased appetite, and fatigue. Counseled patient on reducing meal size and how to titrate medication to minimize side effects. Counseled patient to call if intolerable side effects or if experiencing dehydration, abdominal pain, or dizziness. Along with pharmacotherapy, the patient will follow dietary modifications and aim for at least 150 minutes of moderate-intensity exercise per week, plus resistance training twice a week (as recommended by  the American Heart Association). This resistance training--such as weightlifting, bodyweight exercises, or using resistance bands, adapted to the patient's ability--will help prevent muscle loss.  Follow up regarding coverage of Zepbound once results of sleep study are provided. If therapy is initiated, phone follow-ups will be conducted every 4 weeks for dose titration until the patient reaches the effective therapeutic dose and target weight.  Alaiyah Bollman E. Caro Brundidge, Pharm.D [place] [facility]. [facility] [address], [place], [place] [address] Phone: [phone]; Fax: [phone]

## 2024-08-23 ENCOUNTER — Ambulatory Visit (HOSPITAL_BASED_OUTPATIENT_CLINIC_OR_DEPARTMENT_OTHER): Payer: PRIVATE HEALTH INSURANCE | Attending: Internal Medicine | Admitting: Cardiology

## 2024-08-23 DIAGNOSIS — I442 Atrioventricular block, complete: Secondary | ICD-10-CM | POA: Insufficient documentation

## 2024-08-23 DIAGNOSIS — I441 Atrioventricular block, second degree: Secondary | ICD-10-CM | POA: Diagnosis not present

## 2024-08-23 DIAGNOSIS — G4733 Obstructive sleep apnea (adult) (pediatric): Secondary | ICD-10-CM | POA: Insufficient documentation

## 2024-08-25 NOTE — Procedures (Signed)
 Indications for Polysomnography The patient is a 48 year-old Female who is 5' 2 and weighs 255.0 lbs. Her BMI equals 46.9.  A full night polysomnogram was performed to evaluate for obstructive sleep apnea.  MedicationNo Data. Polysomnogram Data A full night polysomnogram recorded the standard physiologic parameters including EEG, EOG, EMG, EKG, nasal and oral airflow.  Respiratory parameters of chest and abdominal movements were recorded with Respiratory Inductance Plethysmography belts.   Oxygen saturation was recorded by pulse oximetry.  Sleep Architecture The total recording time of the polysomnogram was 406.3 minutes.  The total sleep time was 356.5 minutes.  The patient spent 2.1% of total sleep time in Stage N1, 85.3% in Stage N2, 0.0% in Stages N3, and 12.6% in REM.  Sleep latency was 3.5 minutes.   REM latency was 78.5 minutes.  Sleep Efficiency was 87.7%.  Wake after Sleep Onset time was 46.5 minutes.  Respiratory Events The polysomnogram revealed a presence of 0 obstructive, 0 central, and 0 mixed apneas resulting in an Apnea index of 0 events per hour.  There were 143 hypopneas (GreaterEqual to3% desaturation and/or arousal) resulting in an Apnea\Hypopnea Index (AHI  GreaterEqual to3% desaturation and/or arousal) of 24.1 events per hour.  There were 63 hypopneas (GreaterEqual to4% desaturation) resulting in an Apnea\Hypopnea Index (AHI GreaterEqual to4% desaturation) of 10.6 events per hour.  There were 0 respiratory  Effort Related Arousals resulting in a RERA index of 0 events per hour. The Respiratory Disturbance Index is 24.1 events per hour.  The snore index was 139.5 events per hour.  Mean oxygen saturation was 90.6%.  The lowest oxygen saturation during sleep was 80.0%.  Time spent LessEqual to88% oxygen saturation was  minutes ().  Limb Activity There were 29 total limb movements recorded, of this total, 0 were classified as PLMs.  PLM index was 0 per hour and PLM associated  with Arousals index was 0 per hour.  Cardiac Summary The average pulse rate was 80.0 bpm.  The minimum pulse rate was 38.0 bpm while the maximum pulse rate was 109.0 bpm.  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  Clinical correlation of these findings is necessary.  The decision to treat obstructive sleep apnea (OSA) is usually based on the presence of apnea symptoms or the presence of associated medical conditions such as Hypertension, Congestive Heart  Failure, Atrial Fibrillation or Obesity.  The most common symptoms of OSA are snoring, gasping for breath while sleeping, daytime sleepiness and fatigue.  2.  Initiating apnea therapy is recommended given the presence of symptoms and/or associated conditions. Recommend proceeding with one of the following:  a.  Auto-CPAP therapy with a pressure range of 5-20cm H2O.  b.  An oral appliance (OA) that can be obtained from certain dentists with expertise in sleep medicine.  These are primarily of use in non-obese patients with mild and moderate disease.  c.  An ENT consultation which may be useful to look for specific causes of obstruction and possible treatment options.  d.  If patient is intolerant to PAP therapy, consider referral to ENT for evaluation for hypoglossal nerve stimulator.  3.  Close follow-up is necessary to ensure success with CPAP or oral appliance therapy for maximum benefit.  4.  A follow-up oximetry study on CPAP is recommended to assess the adequacy of  therapy and determine the need for supplemental oxygen or the potential need for Bi-level therapy.  An arterial blood gas to determine the adequacy of baseline ventilation  and oxygenation should also be considered.  5.  Healthy sleep recommendations  include:  adequate nightly sleep (normal 7-9 hrs/night), avoidance of caffeine after noon and alcohol near bedtime, and maintaining a sleep environment that is cool, dark and quiet.  6.  Weight loss for overweight patients is recommended.  Even modest amounts of weight loss can significantly improve the severity of sleep apnea.  7.  Snoring recommendations include:  weight loss where appropriate, side sleeping, and avoidance of alcohol before bed.  8.  Operation of motor vehicle should be avoided when sleepy.   This study was personally reviewed and electronically signed by: WILBERT BIHARI MD Accredited Board Certified in Sleep Medicine Date/Time: 08/25/2024 10:10 PM

## 2024-08-26 ENCOUNTER — Telehealth: Payer: Self-pay

## 2024-08-26 ENCOUNTER — Other Ambulatory Visit (HOSPITAL_COMMUNITY): Payer: Self-pay

## 2024-08-26 NOTE — Telephone Encounter (Signed)
 Pharmacy Patient Advocate Encounter   Received notification from Physician's Office that prior authorization for ZEPBOUND is required/requested.   Insurance verification completed.     Per test claim: Per test claim, medication is not covered due to plan/benefit exclusion, PA not submitted at this time

## 2024-08-31 ENCOUNTER — Other Ambulatory Visit (HOSPITAL_COMMUNITY): Payer: Self-pay

## 2024-08-31 ENCOUNTER — Other Ambulatory Visit: Payer: Self-pay

## 2024-08-31 NOTE — Telephone Encounter (Signed)
 Pt called to FU please advise

## 2024-09-01 ENCOUNTER — Other Ambulatory Visit: Payer: Self-pay

## 2024-09-02 ENCOUNTER — Telehealth (HOSPITAL_COMMUNITY): Payer: Self-pay

## 2024-09-02 NOTE — Telephone Encounter (Signed)
 Called to confirm/remind patient of their appointment [facility] on 09/03/24.   Appointment:   [] Confirmed  [x] Left mess   [] No answer/No voice mail  [] VM Full/unable to leave message  [] Phone not in service  And to bring in all medications and/or complete list.

## 2024-09-03 ENCOUNTER — Other Ambulatory Visit (HOSPITAL_COMMUNITY): Payer: Self-pay

## 2024-09-03 ENCOUNTER — Ambulatory Visit (HOSPITAL_COMMUNITY): Payer: Self-pay | Admitting: Cardiology

## 2024-09-03 ENCOUNTER — Ambulatory Visit (HOSPITAL_COMMUNITY)
Admission: RE | Admit: 2024-09-03 | Discharge: 2024-09-03 | Disposition: A | Discharge status: Home / Self Care | Payer: PRIVATE HEALTH INSURANCE | Source: Ambulatory Visit | Attending: Cardiology | Admitting: Cardiology

## 2024-09-03 ENCOUNTER — Encounter: Admitting: *Deleted

## 2024-09-03 ENCOUNTER — Telehealth (HOSPITAL_COMMUNITY): Payer: Self-pay

## 2024-09-03 ENCOUNTER — Encounter (HOSPITAL_COMMUNITY): Payer: Self-pay

## 2024-09-03 VITALS — BP 108/66 | HR 82 | Wt 231.0 lb

## 2024-09-03 VITALS — BP 110/62 | HR 80 | Temp 98.0°F | Resp 20 | Wt 231.0 lb

## 2024-09-03 DIAGNOSIS — Z6841 Body Mass Index (BMI) 40.0 and over, adult: Secondary | ICD-10-CM | POA: Insufficient documentation

## 2024-09-03 DIAGNOSIS — I428 Other cardiomyopathies: Secondary | ICD-10-CM | POA: Diagnosis not present

## 2024-09-03 DIAGNOSIS — G4733 Obstructive sleep apnea (adult) (pediatric): Secondary | ICD-10-CM | POA: Diagnosis not present

## 2024-09-03 DIAGNOSIS — Z79899 Other long term (current) drug therapy: Secondary | ICD-10-CM | POA: Diagnosis not present

## 2024-09-03 DIAGNOSIS — Z7984 Long term (current) use of oral hypoglycemic drugs: Secondary | ICD-10-CM | POA: Diagnosis not present

## 2024-09-03 DIAGNOSIS — I5022 Chronic systolic (congestive) heart failure: Secondary | ICD-10-CM | POA: Insufficient documentation

## 2024-09-03 DIAGNOSIS — J45909 Unspecified asthma, uncomplicated: Secondary | ICD-10-CM | POA: Diagnosis not present

## 2024-09-03 DIAGNOSIS — Z006 Encounter for examination for normal comparison and control in clinical research program: Secondary | ICD-10-CM

## 2024-09-03 LAB — DIGOXIN LEVEL: Digoxin Level: 0.7 ng/mL — ABNORMAL LOW (ref 0.8–2.0)

## 2024-09-03 LAB — BASIC METABOLIC PANEL WITH GFR
Anion gap: 15 (ref 5–15)
BUN: 12 mg/dL (ref 6–20)
CO2: 22 mmol/L (ref 22–32)
Calcium: 9.3 mg/dL (ref 8.9–10.3)
Chloride: 103 mmol/L (ref 98–111)
Creatinine, Ser: 0.92 mg/dL (ref 0.44–1.00)
GFR, Estimated: >60 mL/min (ref >60)
Glucose, Bld: 80 mg/dL (ref 70–99)
Potassium: 3.5 mmol/L (ref 3.5–5.1)
Sodium: 140 mmol/L (ref 135–145)

## 2024-09-03 LAB — BRAIN NATRIURETIC PEPTIDE: B Natriuretic Peptide: 502.6 pg/mL — ABNORMAL HIGH (ref 0.0–100.0)

## 2024-09-03 MED ORDER — SACUBITRIL-VALSARTAN 24-26 MG PO TABS
1.0000 | ORAL_TABLET | Freq: Two times a day (BID) | ORAL | 10 refills | Status: AC
  Filled 2024-09-03: qty 30, 15d supply, fill #0
  Filled 2024-09-03: qty 60, 30d supply, fill #0
  Filled 2024-10-26 (×2): qty 60, 30d supply, fill #1

## 2024-09-03 NOTE — Research (Signed)
  COMET  Informed Consent   Subject Name: Carolyn Oconnor  Subject met inclusion and exclusion criteria.  The informed consent form, study requirements and expectations were reviewed with the subject and questions and concerns were addressed prior to the signing of the consent form.  The subject verbalized understanding of the trial requirements.  The subject agreed to participate in the COMET  trial and signed the informed consent at 11:20 A.M. on 09-03-2024.  The informed consent was obtained prior to performance of any protocol-specific procedures for the subject.  A copy of the signed informed consent was given to the subject and a copy was placed in the subject's medical record.   Carolyn Oconnor         Eligibility Criteria:  More detailed eligibility criteria are presented in Section 5. Inclusion Criteria Adult patients who meet all the following criteria at screening may be included in the study:  Are between >= 18 years and <= 85 years at the signing of informed consent  Have a history of chronic HFrEF, defined as requiring treatment for HF for a minimum of  3 months prior to screening  Are receiving oral loop diuretics  Patients without AFF on screening ECG: ? LVEF < 30% within 6 months of screening ? Elevated N-terminal prohormone of B-type natriuretic peptide (NT-proBNP) >= 1000  pg/mL (BNP >= 300 pg/mL   Patients with AFF on screening ECG: ? LVEF < 25% within 6 months of screening ? Elevated N-terminal prohormone of B-type natriuretic peptide (NT-proBNP) >= 3000  pg/mL (BNP >= 900 pg/mL) ? Not currently taking digoxin   Are currently hospitalized with the primary reason of HF, or had an HF event within 6  months prior to screening  Are established on regional standard-of-care HF therapies for at least 30 days prior to screening  Systolic blood pressure <= 130 mmHg and diastolic blood pressure <= 90 mmH   Exclusion Criteria  Any of the following criteria will  exclude potential patients from the study:  Have AFF on the screening ECG and are currently taking digoxin   Have had acute coronary syndrome, cardiac surgery, valve surgery, any coronary  revascularization, and/or cardiac resynchronization therapy within 3 months of screening  Are admitted to a long-term care facility or hospice  Have a projected survival of < 12 months due to non-cardiovascular causes based on clinical  judgment  Are receiving intravenous inotropes or intravenous vasopressors <= 3 days prior to screening  Are receiving mechanical hemodynamic support or mechanical ventilation <= 7 days prior to  screening  Are receiving intravenous diuretics, intravenous vasodilators, or supplemental oxygen therapy  <= 12 hours prior to screening  Have an estimated glomerular filtration rate (eGFR) < 20 mL/min/1.67m2 or receiving dialysis  at screening  Have previously had a solid organ transplant   Are receiving treatment in another investigational device or drug study or are within 30 days of  ending such investigational treatment at screening  Have previously received omecamtiv mecarbil  Are pregnant or planning pregnancy during the study period, or planning to breastfeed during  treatment with IP or within 5 days after the end of treatment with I      Date of Visit (Screening): 09-03-2024   Physical Exam : Yes  Electrocardiogram: Yes  Screening Labs(Local) Yes  Chemistry,Hematology, Nt-ProBNP, HS Troponin I Yes  EQ-5D-5L: Yes  KCCQ: Yes

## 2024-09-03 NOTE — Progress Notes (Signed)
 ADVANCED HF CLINIC NOTE  Primary Care: Medina-Vargas, Jereld BROCKS, NP HF Cardiologist: Dr. Cherrie   HPI: Ms. Armwood is a 48 y.o. AAF w/ h/o asthma, obesity and recently diagnosed systolic heart failure.   Admitted 4/30- 03/04/23 with acute systolic HF. Presented to the ED w/ several weeks progressive SOB, orthopnea and b/l LE. Also felt to have component of acute asthma exacerbation. Diuresed with IV Lasix , started prednisone  and bronchodilators. 2D echo was completed and showed severely reduced LVEF 20-25%, mild- mod MR, RV hyperdynamic. L/RHC: normal coronaries, marked volume overload with persevered CO. cMRI 5/24: LVEF 25%, RVEF 42%. Non ischemic cardiomyopathy. GDMT started. Discharge weight 248 lbs.   Admitted 9/25 with a/c HF. Echo showed EF 20-25%, severely reduced RV. Diuresed with IV lasix .Underwent RHC showing normal filling pressures and well compensated CI. GDMT titrated and she was discharged home, weight 231 lbs.  Today she returns for HF follow up. Doing well. Feels better. Denies resting dyspnea. No orthopnea/PND. No LEE. Checking wt daily. Wt down at home at 225 lb. Reports stable NYHA Class I- II symptoms. No side effects. Compliant w/ meds. BP 108/66. Denies orthostatic symptoms.   She works as a LAWYER at [employer], also theatre stage manager. No ETOH, tobacco or drug use.  Cardiac studies:  - RHC 9/25: RA 2, PA 38/21 (27), PCWP 13, CO/CI (Fick) 5.72/2.86, PVR 2.6 WU, PAPi 8.5  - Echo 9/25: EF < 20%, G2DD, RV severely reduced, mild to moderate MR, severe TR  - Echo 08/22/23 EF ~30-35% RV ok (limited images)   - L/RHC 5/24: Normal cors. RA 13, PA 59/42 (48), PCWP 33, CO/CI (Fick) 6.2/2.9, PVR 2.4 WU, PAPi 1.3  - cMRI 5/24: LVEF 25%, RVEF 42%. Non ischemic cardiomyopathy.  - Echo 4/24: LVEF 20-25%, mild- mod MR, RV hyperdynamic.   Past Medical History:  Diagnosis Date   Allergic rhinitis    Asthma    Bronchitis, chronic (HCC)    CHF (congestive heart  failure) (HCC)    Pneumonia     Current Outpatient Medications  Medication Sig Dispense Refill   acetaminophen  (TYLENOL ) 325 MG tablet Take 2 tablets (650 mg total) by mouth every 6 (six) hours as needed for mild pain (or Fever >/= 101). 20 tablet 0   albuterol  (PROVENTIL  HFA;VENTOLIN  HFA) 108 (90 Base) MCG/ACT inhaler Inhale 2 puffs into the lungs every 4 (four) hours as needed (cough and wheeze). For asthma attacks. 1 Inhaler 3   budesonide -formoterol  (SYMBICORT ) 80-4.5 MCG/ACT inhaler Inhale 2 puffs into the lungs 2 (two) times daily as needed.     dapagliflozin  propanediol (FARXIGA ) 10 MG TABS tablet Take 1 tablet (10 mg total) by mouth daily before breakfast. 30 tablet 6   digoxin  (LANOXIN ) 0.125 MG tablet Take 1 tablet (0.125 mg total) by mouth daily. 30 tablet 5   fluticasone  (FLONASE ) 50 MCG/ACT nasal spray Place 2 sprays into both nostrils 2 (two) times daily as needed for allergies or rhinitis.     losartan  (COZAAR ) 25 MG tablet Take 1 tablet (25 mg total) by mouth daily. 30 tablet 5   montelukast  (SINGULAIR ) 10 MG tablet Take 1 tablet (10 mg total) by mouth at bedtime. 30 tablet 5   Multiple Vitamin (MULTIVITAMIN WITH MINERALS) TABS tablet Take 1 tablet by mouth daily. 30 tablet 5   potassium chloride  SA (KLOR-CON  M) 20 MEQ tablet Take 2 tablets (40 mEq total) by mouth daily. 180 tablet 3   spironolactone  (ALDACTONE ) 25 MG tablet Take 1 tablet (25  mg total) by mouth daily. 30 tablet 5   torsemide  (DEMADEX ) 20 MG tablet Take 2 tablets (40 mg total) by mouth daily. 90 tablet 11   No current facility-administered medications for this encounter.   Allergies  Allergen Reactions   Aspirin      Patient states that it makes her go into an asthma attack.    Social History   Socioeconomic History   Marital status: Significant Other    Spouse name: Not on file   Number of children: 0   Years of education: Not on file   Highest education level: Associate degree: academic program   Occupational History   Occupation: [employer]  Tobacco Use   Smoking status: Never    Passive exposure: Yes   Smokeless tobacco: Never   Tobacco comments:    Secondhand from Father  Vaping Use   Vaping status: Never Used  Substance and Sexual Activity   Alcohol use: No    Alcohol/week: 0.0 standard drinks of alcohol   Drug use: No    Frequency: 1.0 times per week   Sexual activity: Not on file  Other Topics Concern   Not on file  Social History Narrative   Single, lives with boyfriend   CNA, Editor, Commissioning   No recent travel      Adult Nurse Pulmonary:   Originally from [place]. Always lived in [place]. Previously hasn't traveled outside of the state. 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 No pets currently. She does have carpet & draperies in her home as well as the bedroom. No indoor plants. No bird exposure. No mold exposure. Enjoys drawing & reading.       Diet: Left Blank      Caffeine: Yes      Married, Single if yes what year:       Do you live in a house, apartment, assisted living, condo, trailer, ect: apartment      Is it one or more stories: Left Blank      How many persons live in your home? 2      Pets: no      Highest level or education completed: [employer]      Current/Past profession: Computer      Exercise:  Yes                Type and how often: 2 time a day         Living Will: Yes   DNR: Yes   POA/HPOA: Yes      Functional Status:   Do you have difficulty bathing or dressing yourself? No   Do you have difficulty preparing food or eating? No   Do you have difficulty managing your medications? No   Do you have difficulty managing your finances? No   Do you have difficulty affording your medications? No   Social Drivers of Corporate Investment Banker Strain: Low Risk  (06/10/2024)   Overall Financial Resource Strain (CARDIA)    Difficulty of Paying Living Expenses: Not hard at all  Food  Insecurity: No Food Insecurity (07/02/2024)   Hunger Vital Sign    Worried About Running Out of Food in the Last Year: Never true    Ran Out of Food in the Last Year: Never true  Transportation Needs: No Transportation Needs (07/02/2024)   PRAPARE - Administrator, Civil Service (Medical): No    Lack of Transportation (Non-Medical):  No  Physical Activity: Sufficiently Active (06/10/2024)   Exercise Vital Sign    Days of Exercise per Week: 3 days    Minutes of Exercise per Session: 70 min  Stress: Stress Concern Present (06/10/2024)   [employer] of Occupational Health - Occupational Stress Questionnaire    Feeling of Stress: To some extent  Social Connections: Moderately Isolated (06/10/2024)   Social Connection and Isolation Panel    Frequency of Communication with Friends and Family: Once a week    Frequency of Social Gatherings with Friends and Family: Once a week    Attends Religious Services: Never    Database Administrator or Organizations: Yes    Attends Banker Meetings: 1 to 4 times per year    Marital Status: Living with partner  Intimate Partner Violence: Not At Risk (07/02/2024)   Humiliation, Afraid, Rape, and Kick questionnaire    Fear of Current or Ex-Partner: No    Emotionally Abused: No    Physically Abused: No    Sexually Abused: No     Family History  Problem Relation Age of Onset   Allergies Mother    Heart disease Mother    Allergies Father    Asthma Father    Diabetes Sister    Asthma Sister    Allergies Sister    Cancer Sister    Asthma Brother    Allergies Brother    Heart disease Maternal Aunt    Breast cancer Maternal Aunt     BP 108/66   Pulse 82   Wt 104.8 kg (231 lb)   SpO2 94%   BMI 42.25 kg/m   Wt Readings from Last 3 Encounters:  09/03/24 104.8 kg (231 lb)  08/23/24 115.7 kg (255 lb)  08/20/24 103.9 kg (229 lb)   Physical Exam  GENERAL: obese, NAD Lungs- clear CARDIAC:  short thick neck, JVD not well  visualized          Normal rate with regular rhythm. No MRG. No LEE ABDOMEN: Soft, non-tender, non-distended.  EXTREMITIES: Warm and well perfused.  NEUROLOGIC: No obvious FND   ECG not performed   ASSESSMENT & PLAN: 1. Chronic Systolic Heart Failure - NICM, diagnosed 01/2023. - Etiology uncertain. - Echo EF 20-25% (looks closer to 30-35%), RV hyperdynamic  - R/LHC 5/24: no CAD, RA 13, PA 59/42 (48), PCW 33, LV EDP 35. Fick CO 6.13, CI 2.87. Thermo CO/CI: 6.3/2.9. PAPi 1.3 - cMRI 5/24: LVEF 25%, RVEF 42%. Non ischemic cardiomyopathy.  - Echo 08/22/23 EF 30-35% RV ok (limited images)  - Echo 9/25: EF < 20%, G2DD, severe RV dysfunction, RVSP 44 - RHC 9/25: normal filling pressures and well compensated CI at 2.86 - NYHA Class I-II. Euvolemic on exam. Wt continues to trend down from recent hospital d/c wt - Continue torsemide  40 mg daily. Check BMP and BNP today  - Continue spiro 25 mg daily  - Stop Losartan  - Start Entresto 24-26 mg bid ($15 copay) - Continue digoxin  0.125 mg daily. Check dig level  - Continue Farxiga  10 mg daily   - Likely will need advanced therapies soon but limited by weight, noncompliance, RV dysfunction and limited insight into her treatment plan    2. Morbid obesity - Body mass index is 42.25 kg/m. - Has seen PharmD for GLP1RA. Unfortunately, insurance will not cover  - discussed lifestyle modifications thought diet and exercise/ portion control    3. OSA - sleep study 10/25 c/w mild OSA  - followed  by Dr. Shlomo, pending CPAP titration   F/u in 4 wks w/Pharm D and 8 wks w/ APP for further GDMT titration. F/u in 12 wks w/ Dr. Cherrie w/ Echo   Caffie Shed, PA-C  10:57 AM

## 2024-09-03 NOTE — Telephone Encounter (Signed)
 Advanced Heart Failure Patient Advocate Encounter  Test billing for this patient's current coverage (ProAct) returns a $15 copay for 30 day supply of Sacubitril-Valsartan. Insurance limit 30 day fill.  This test claim was processed through  [facility]- copay amounts may vary at other pharmacies due to pharmacy/plan contracts, or as the patient moves through the different stages of their insurance plan.  Rachel DEL, CPhT Rx Patient Advocate Phone: [phone]

## 2024-09-03 NOTE — Patient Instructions (Addendum)
 Good to see you today!  STOP Losartan   START Entresto 24/26 mg Twice daily  Labs done today, your results will be available in MyChart, we will contact you for abnormal readings.  Please follow up with our heart failure pharmacist as scheduled  Your physician has requested that you have an echocardiogram. Echocardiography is a painless test that uses sound waves to create images of your heart. It provides your doctor with information about the size and shape of your heart and how well your heart's chambers and valves are working. This procedure takes approximately one hour. There are no restrictions for this procedure. Please do NOT wear cologne, perfume, aftershave, or lotions (deodorant is allowed). Please arrive 15 minutes prior to your appointment time.  Please note: We ask at that you not bring children with you during ultrasound (echo/ vascular) testing. Due to room size and safety concerns, children are not allowed in the ultrasound rooms during exams. Our front office staff cannot provide observation of children in our lobby area while testing is being conducted. An adult accompanying a patient to their appointment will only be allowed in the ultrasound room at the discretion of the ultrasound technician under special circumstances. We apologize for any inconvenience.   Your physician recommends that you schedule a follow-up appointment as scheduled with app  Call office in  December to schedule an appointment for echo and visit with Dr. Bensimhon If you have any questions or concerns before your next appointment please send us  a message through [facility] or call our office at [phone].    TO LEAVE A MESSAGE FOR THE NURSE SELECT OPTION 2, PLEASE LEAVE A MESSAGE INCLUDING: YOUR NAME DATE OF BIRTH CALL BACK NUMBER REASON FOR CALL**this is important as we prioritize the call backs  YOU WILL RECEIVE A CALL BACK THE SAME DAY AS LONG AS YOU CALL BEFORE 4:00 PM At the [facility], you and your health needs are our priority. As part of our continuing mission to provide you with exceptional heart care, we have created designated Provider Care Teams. These Care Teams include your primary Cardiologist (physician) and Advanced Practice Providers (APPs- Physician Assistants and Nurse Practitioners) who all work together to provide you with the care you need, when you need it.   You may see any of the following providers on your designated Care Team at your next follow up: Dr Toribio Fuel Dr Ezra Shuck Dr. Morene Brownie Greig Mosses, NP Caffie Shed, [place] [facility] [place], [place] Beckey Coe, NP Jordan Lee, NP Ellouise Class, NP Tinnie Redman, PharmD Jaun Bash, PharmD   Please be sure to bring in all your medications bottles to every appointment.    Thank you for choosing [facility]

## 2024-09-04 ENCOUNTER — Ambulatory Visit (HOSPITAL_COMMUNITY): Payer: Self-pay | Admitting: Cardiology

## 2024-09-04 LAB — COMPREHENSIVE METABOLIC PANEL WITH GFR
ALT: 27 IU/L (ref 0–32)
AST: 29 IU/L (ref 0–40)
Albumin: 4 g/dL (ref 3.9–4.9)
Alkaline Phosphatase: 72 IU/L (ref 41–116)
BUN/Creatinine Ratio: 15 (ref 9–23)
BUN: 13 mg/dL (ref 6–24)
Bilirubin Total: 0.5 mg/dL (ref 0.0–1.2)
CO2: 20 mmol/L (ref 20–29)
Calcium: 9.5 mg/dL (ref 8.7–10.2)
Chloride: 101 mmol/L (ref 96–106)
Creatinine, Ser: 0.84 mg/dL (ref 0.57–1.00)
Globulin, Total: 3 g/dL (ref 1.5–4.5)
Glucose: 76 mg/dL (ref 70–99)
Potassium: 3.6 mmol/L (ref 3.5–5.2)
Sodium: 141 mmol/L (ref 134–144)
Total Protein: 7 g/dL (ref 6.0–8.5)
eGFR: 86 mL/min/1.73 (ref >59)

## 2024-09-07 ENCOUNTER — Telehealth: Payer: Self-pay | Admitting: *Deleted

## 2024-09-07 NOTE — Telephone Encounter (Signed)
 Called patient to let her know I got her labs back and she qualifies for the COMET study and left message for her to call me back to schedule her RUN in visit appointment.   Ranesha Val, Research Coordinator 09/07/2024

## 2024-09-10 ENCOUNTER — Other Ambulatory Visit (HOSPITAL_COMMUNITY): Payer: Self-pay

## 2024-10-01 ENCOUNTER — Telehealth: Payer: Self-pay | Admitting: *Deleted

## 2024-10-01 NOTE — Telephone Encounter (Signed)
-----   Message from Wilbert Bihari sent at 08/25/2024 10:13 PM EDT ----- Please let patient know that they have sleep apnea.  Recommend therapeutic CPAP titration for treatment of patient's sleep disordered breathing.

## 2024-10-01 NOTE — Telephone Encounter (Signed)
 The patient has been notified of the result via her mychart and her voicemail.

## 2024-10-06 NOTE — Progress Notes (Incomplete)
 ADVANCED HF CLINIC NOTE  Primary Care: Medina-Vargas, Jereld BROCKS, NP HF Cardiologist: Dr. Cherrie   HPI: Ms. Laboy is a 48 y.o. AAF w/ h/o asthma, obesity and recently diagnosed systolic heart failure.   Admitted 4/30- 03/04/23 with acute systolic HF. Presented to the ED w/ several weeks progressive SOB, orthopnea and b/l LE. Also felt to have component of acute asthma exacerbation. Diuresed with IV Lasix , started prednisone  and bronchodilators. 2D echo was completed and showed severely reduced LVEF 20-25%, mild- mod MR, RV hyperdynamic. L/RHC: normal coronaries, marked volume overload with persevered CO. cMRI 5/24: LVEF 25%, RVEF 42%. Non ischemic cardiomyopathy. GDMT started. Discharge weight 248 lbs.   Admitted 9/25 with a/c HF. Echo showed EF 20-25%, severely reduced RV. Diuresed with IV lasix .Underwent RHC showing normal filling pressures and well compensated CI. GDMT titrated and she was discharged home, weight 231 lbs.  Today she returns for HF follow up. Doing well. Feels better. Denies resting dyspnea. No orthopnea/PND. No LEE. Checking wt daily. Wt down at home at 225 lb. Reports stable NYHA Class I- II symptoms. No side effects. Compliant w/ meds. BP 108/66. Denies orthostatic symptoms.   She works as a LAWYER at [employer], also theatre stage manager. No ETOH, tobacco or drug use.  Cardiac studies:  - RHC 9/25: RA 2, PA 38/21 (27), PCWP 13, CO/CI (Fick) 5.72/2.86, PVR 2.6 WU, PAPi 8.5  - Echo 9/25: EF < 20%, G2DD, RV severely reduced, mild to moderate MR, severe TR  - Echo 08/22/23 EF ~30-35% RV ok (limited images)   - L/RHC 5/24: Normal cors. RA 13, PA 59/42 (48), PCWP 33, CO/CI (Fick) 6.2/2.9, PVR 2.4 WU, PAPi 1.3  - cMRI 5/24: LVEF 25%, RVEF 42%. Non ischemic cardiomyopathy.  - Echo 4/24: LVEF 20-25%, mild- mod MR, RV hyperdynamic.   Past Medical History:  Diagnosis Date   Allergic rhinitis    Asthma    Bronchitis, chronic (HCC)    CHF (congestive heart  failure) (HCC)    Pneumonia     Current Outpatient Medications  Medication Sig Dispense Refill   acetaminophen  (TYLENOL ) 325 MG tablet Take 2 tablets (650 mg total) by mouth every 6 (six) hours as needed for mild pain (or Fever >/= 101). 20 tablet 0   albuterol  (PROVENTIL  HFA;VENTOLIN  HFA) 108 (90 Base) MCG/ACT inhaler Inhale 2 puffs into the lungs every 4 (four) hours as needed (cough and wheeze). For asthma attacks. 1 Inhaler 3   budesonide -formoterol  (SYMBICORT ) 80-4.5 MCG/ACT inhaler Inhale 2 puffs into the lungs 2 (two) times daily as needed.     dapagliflozin  propanediol (FARXIGA ) 10 MG TABS tablet Take 1 tablet (10 mg total) by mouth daily before breakfast. 30 tablet 6   digoxin  (LANOXIN ) 0.125 MG tablet Take 1 tablet (0.125 mg total) by mouth daily. 30 tablet 5   fluticasone  (FLONASE ) 50 MCG/ACT nasal spray Place 2 sprays into both nostrils 2 (two) times daily as needed for allergies or rhinitis.     montelukast  (SINGULAIR ) 10 MG tablet Take 1 tablet (10 mg total) by mouth at bedtime. 30 tablet 5   Multiple Vitamin (MULTIVITAMIN WITH MINERALS) TABS tablet Take 1 tablet by mouth daily. 30 tablet 5   potassium chloride  SA (KLOR-CON  M) 20 MEQ tablet Take 2 tablets (40 mEq total) by mouth daily. 180 tablet 3   sacubitril -valsartan  (ENTRESTO ) 24-26 MG Take 1 tablet by mouth 2 (two) times daily. 30 tablet 10   spironolactone  (ALDACTONE ) 25 MG tablet Take 1 tablet (25 mg  total) by mouth daily. 30 tablet 5   torsemide  (DEMADEX ) 20 MG tablet Take 2 tablets (40 mg total) by mouth daily. 90 tablet 11   No current facility-administered medications for this visit.   Allergies  Allergen Reactions   Aspirin      Patient states that it makes her go into an asthma attack.    Social History   Socioeconomic History   Marital status: Significant Other    Spouse name: Not on file   Number of children: 0   Years of education: Not on file   Highest education level: Associate degree: academic program   Occupational History   Occupation: [employer]  Tobacco Use   Smoking status: Never    Passive exposure: Yes   Smokeless tobacco: Never   Tobacco comments:    Secondhand from Father  Vaping Use   Vaping status: Never Used  Substance and Sexual Activity   Alcohol use: No    Alcohol/week: 0.0 standard drinks of alcohol   Drug use: No    Frequency: 1.0 times per week   Sexual activity: Not on file  Other Topics Concern   Not on file  Social History Narrative   Single, lives with boyfriend   CNA, Editor, Commissioning   No recent travel      Adult Nurse Pulmonary:   Originally from [place]. Always lived in [place]. Previously hasn't traveled outside of the state. 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 No pets currently. She does have carpet & draperies in her home as well as the bedroom. No indoor plants. No bird exposure. No mold exposure. Enjoys drawing & reading.       Diet: Left Blank      Caffeine: Yes      Married, Single if yes what year:       Do you live in a house, apartment, assisted living, condo, trailer, ect: apartment      Is it one or more stories: Left Blank      How many persons live in your home? 2      Pets: no      Highest level or education completed: [employer]      Current/Past profession: Computer      Exercise:  Yes                Type and how often: 2 time a day         Living Will: Yes   DNR: Yes   POA/HPOA: Yes      Functional Status:   Do you have difficulty bathing or dressing yourself? No   Do you have difficulty preparing food or eating? No   Do you have difficulty managing your medications? No   Do you have difficulty managing your finances? No   Do you have difficulty affording your medications? No   Social Drivers of Corporate Investment Banker Strain: Low Risk  (06/10/2024)   Overall Financial Resource Strain (CARDIA)    Difficulty of Paying Living Expenses: Not hard at all  Food  Insecurity: No Food Insecurity (07/02/2024)   Hunger Vital Sign    Worried About Running Out of Food in the Last Year: Never true    Ran Out of Food in the Last Year: Never true  Transportation Needs: No Transportation Needs (07/02/2024)   PRAPARE - Administrator, Civil Service (Medical): No    Lack of Transportation (Non-Medical): No  Physical Activity: Sufficiently Active (06/10/2024)   Exercise Vital Sign    Days of Exercise per Week: 3 days    Minutes of Exercise per Session: 70 min  Stress: Stress Concern Present (06/10/2024)   [employer] of Occupational Health - Occupational Stress Questionnaire    Feeling of Stress: To some extent  Social Connections: Moderately Isolated (06/10/2024)   Social Connection and Isolation Panel    Frequency of Communication with Friends and Family: Once a week    Frequency of Social Gatherings with Friends and Family: Once a week    Attends Religious Services: Never    Database Administrator or Organizations: Yes    Attends Banker Meetings: 1 to 4 times per year    Marital Status: Living with partner  Intimate Partner Violence: Not At Risk (07/02/2024)   Humiliation, Afraid, Rape, and Kick questionnaire    Fear of Current or Ex-Partner: No    Emotionally Abused: No    Physically Abused: No    Sexually Abused: No     Family History  Problem Relation Age of Onset   Allergies Mother    Heart disease Mother    Allergies Father    Asthma Father    Diabetes Sister    Asthma Sister    Allergies Sister    Cancer Sister    Asthma Brother    Allergies Brother    Heart disease Maternal Aunt    Breast cancer Maternal Aunt     There were no vitals taken for this visit.  Wt Readings from Last 3 Encounters:  09/03/24 104.8 kg (231 lb)  09/03/24 104.8 kg (231 lb)  08/23/24 115.7 kg (255 lb)   Physical Exam  GENERAL: obese, NAD Lungs- clear CARDIAC:  short thick neck, JVD not well visualized          Normal rate with  regular rhythm. No MRG. No LEE ABDOMEN: Soft, non-tender, non-distended.  EXTREMITIES: Warm and well perfused.  NEUROLOGIC: No obvious FND   ECG not performed   ASSESSMENT & PLAN: 1. Chronic Systolic Heart Failure - NICM, diagnosed 01/2023. - Etiology uncertain. - Echo EF 20-25% (looks closer to 30-35%), RV hyperdynamic  - R/LHC 5/24: no CAD, RA 13, PA 59/42 (48), PCW 33, LV EDP 35. Fick CO 6.13, CI 2.87. Thermo CO/CI: 6.3/2.9. PAPi 1.3 - cMRI 5/24: LVEF 25%, RVEF 42%. Non ischemic cardiomyopathy.  - Echo 08/22/23 EF 30-35% RV ok (limited images)  - Echo 9/25: EF < 20%, G2DD, severe RV dysfunction, RVSP 44 - RHC 9/25: normal filling pressures and well compensated CI at 2.86 - NYHA Class I-II. Euvolemic on exam. Wt continues to trend down from recent hospital d/c wt - Continue torsemide  40 mg daily. Check BMP and BNP today  - Continue spiro 25 mg daily  - Stop Losartan  - Start Entresto  24-26 mg bid ($15 copay) - Continue digoxin  0.125 mg daily. Check dig level  - Continue Farxiga  10 mg daily   - Likely will need advanced therapies soon but limited by weight, noncompliance, RV dysfunction and limited insight into her treatment plan    2. Morbid obesity - There is no height or weight on file to calculate BMI. - Has seen PharmD for GLP1RA. Unfortunately, insurance will not cover  - discussed lifestyle modifications thought diet and exercise/ portion control    3. OSA - sleep study 10/25 c/w mild OSA  - followed by Dr. Shlomo, pending CPAP titration   F/u in 4 wks  w/Pharm D and 8 wks w/ APP for further GDMT titration. F/u in 12 wks w/ Dr. Cherrie w/ Echo   Caffie Shed, PA-C  9:31 PM

## 2024-10-07 ENCOUNTER — Ambulatory Visit (HOSPITAL_COMMUNITY)

## 2024-10-12 NOTE — Telephone Encounter (Signed)
 The patient has been notified of the result via her mychart and her voicemail.

## 2024-10-13 ENCOUNTER — Encounter: Payer: Self-pay | Admitting: *Deleted

## 2024-10-26 ENCOUNTER — Other Ambulatory Visit (HOSPITAL_COMMUNITY): Payer: Self-pay

## 2024-10-26 ENCOUNTER — Telehealth (HOSPITAL_COMMUNITY): Payer: Self-pay

## 2024-10-26 NOTE — Telephone Encounter (Signed)
 Called to confirm/remind patient of their appointment at the [facility] on 10/26/24.   Appointment:   [] Confirmed  [x] Left mess   [] No answer/No voice mail  [] VM Full/unable to leave message  [] Phone not in service  Patient reminded to bring all medications and/or complete list.  Confirmed patient has transportation. Gave directions, instructed to utilize valet parking.

## 2024-10-27 ENCOUNTER — Ambulatory Visit (HOSPITAL_COMMUNITY)
Admission: RE | Admit: 2024-10-27 | Discharge: 2024-10-27 | Disposition: A | Discharge status: Home / Self Care | Source: Ambulatory Visit | Attending: Physician Assistant | Admitting: Physician Assistant

## 2024-10-27 ENCOUNTER — Other Ambulatory Visit (HOSPITAL_COMMUNITY): Payer: Self-pay | Admitting: Physician Assistant

## 2024-10-27 ENCOUNTER — Other Ambulatory Visit (HOSPITAL_COMMUNITY): Payer: Self-pay

## 2024-10-27 ENCOUNTER — Encounter (HOSPITAL_COMMUNITY): Payer: Self-pay

## 2024-10-27 VITALS — BP 110/58 | HR 93 | Wt 230.0 lb

## 2024-10-27 DIAGNOSIS — I428 Other cardiomyopathies: Secondary | ICD-10-CM | POA: Diagnosis not present

## 2024-10-27 DIAGNOSIS — G4733 Obstructive sleep apnea (adult) (pediatric): Secondary | ICD-10-CM | POA: Diagnosis not present

## 2024-10-27 DIAGNOSIS — Z6841 Body Mass Index (BMI) 40.0 and over, adult: Secondary | ICD-10-CM | POA: Diagnosis not present

## 2024-10-27 DIAGNOSIS — G4736 Sleep related hypoventilation in conditions classified elsewhere: Secondary | ICD-10-CM | POA: Insufficient documentation

## 2024-10-27 DIAGNOSIS — J45909 Unspecified asthma, uncomplicated: Secondary | ICD-10-CM | POA: Diagnosis not present

## 2024-10-27 DIAGNOSIS — Z79899 Other long term (current) drug therapy: Secondary | ICD-10-CM | POA: Insufficient documentation

## 2024-10-27 DIAGNOSIS — Z7984 Long term (current) use of oral hypoglycemic drugs: Secondary | ICD-10-CM | POA: Diagnosis not present

## 2024-10-27 DIAGNOSIS — I5022 Chronic systolic (congestive) heart failure: Secondary | ICD-10-CM | POA: Insufficient documentation

## 2024-10-27 MED ORDER — BISOPROLOL FUMARATE 2.5 MG PO TABS
2.5000 mg | ORAL_TABLET | Freq: Every day | ORAL | 5 refills | Status: AC
  Filled 2024-10-27: qty 30, 30d supply, fill #0

## 2024-10-27 NOTE — Progress Notes (Addendum)
 "  ADVANCED HF CLINIC NOTE  Primary Care: Medina-Vargas, Jereld BROCKS, NP HF Cardiologist: Dr. Cherrie   HPI: Carolyn Oconnor is a 48 y.o. AAF w/ h/o asthma, obesity and recently diagnosed systolic heart failure.   Admitted 4/30- 03/04/23 with acute systolic HF. Presented to the ED w/ several weeks progressive SOB, orthopnea and b/l LE. Also felt to have component of acute asthma exacerbation. Diuresed with IV Lasix , started prednisone  and bronchodilators. 2D echo was completed and showed severely reduced LVEF 20-25%, mild- mod MR, RV hyperdynamic. L/RHC: normal coronaries, marked volume overload with persevered CO. cMRI 5/24: LVEF 25%, RVEF 42%. Non ischemic cardiomyopathy. GDMT started. Discharge weight 248 lbs.   Admitted 9/25 with a/c HF. Echo showed EF 20-25%, severely reduced RV. Diuresed with IV lasix . RHC w/ normal filling pressures and preserved CI. GDMT titrated and she was discharged home, weight 231 lbs.  Sleep study 10/25: mild OSA and nocturnal hypoxemia. Transient wenckebach and CHB. Awaiting CPAP titration - needs to call Dr. Dorine office.  Here today for CHF follow-up. She was exposed to sick patients and coworkers recently and developed cold-like symptoms about a week ago. Has been struggling with cough, fatigue and decreased appetite. Intermittently wheezing and using her inhaler. Taking all HF meds as prescribed. No orthopnea, PND or lower extremity edema. Doesn't feel she is retaining any fluid.   She works as a LAWYER at [employer], also theatre stage manager. No ETOH, tobacco or drug use.  Cardiac studies:  - RHC 9/25: RA 2, PA 38/21 (27), PCWP 13, CO/CI (Fick) 5.72/2.86, PVR 2.6 WU, PAPi 8.5  - Echo 9/25: EF < 20%, G2DD, RV severely reduced, mild to moderate MR, severe TR  - Echo 08/22/23 EF ~30-35% RV ok (limited images)   - L/RHC 5/24: Normal cors. RA 13, PA 59/42 (48), PCWP 33, CO/CI (Fick) 6.2/2.9, PVR 2.4 WU, PAPi 1.3  - cMRI 5/24: LVEF 25%, RVEF 42%. Non  ischemic cardiomyopathy.  - Echo 4/24: LVEF 20-25%, mild- mod MR, RV hyperdynamic.   Past Medical History:  Diagnosis Date   Allergic rhinitis    Asthma    Bronchitis, chronic (HCC)    CHF (congestive heart failure) (HCC)    Pneumonia     Current Outpatient Medications  Medication Sig Dispense Refill   acetaminophen  (TYLENOL ) 325 MG tablet Take 2 tablets (650 mg total) by mouth every 6 (six) hours as needed for mild pain (or Fever >/= 101). 20 tablet 0   albuterol  (PROVENTIL  HFA;VENTOLIN  HFA) 108 (90 Base) MCG/ACT inhaler Inhale 2 puffs into the lungs every 4 (four) hours as needed (cough and wheeze). For asthma attacks. 1 Inhaler 3   budesonide -formoterol  (SYMBICORT ) 80-4.5 MCG/ACT inhaler Inhale 2 puffs into the lungs 2 (two) times daily as needed.     dapagliflozin  propanediol (FARXIGA ) 10 MG TABS tablet Take 1 tablet (10 mg total) by mouth daily before breakfast. 30 tablet 6   digoxin  (LANOXIN ) 0.125 MG tablet Take 1 tablet (0.125 mg total) by mouth daily. 30 tablet 5   fluticasone  (FLONASE ) 50 MCG/ACT nasal spray Place 2 sprays into both nostrils 2 (two) times daily as needed for allergies or rhinitis.     montelukast  (SINGULAIR ) 10 MG tablet Take 1 tablet (10 mg total) by mouth at bedtime. 30 tablet 5   Multiple Vitamin (MULTIVITAMIN WITH MINERALS) TABS tablet Take 1 tablet by mouth daily. 30 tablet 5   potassium chloride  SA (KLOR-CON  M) 20 MEQ tablet Take 2 tablets (40 mEq total) by mouth  daily. 180 tablet 3   sacubitril -valsartan  (ENTRESTO ) 24-26 MG Take 1 tablet by mouth 2 (two) times daily. 30 tablet 10   spironolactone  (ALDACTONE ) 25 MG tablet Take 1 tablet (25 mg total) by mouth daily. 30 tablet 5   torsemide  (DEMADEX ) 20 MG tablet Take 2 tablets (40 mg total) by mouth daily. 90 tablet 11   No current facility-administered medications for this encounter.   Allergies  Allergen Reactions   Aspirin      Patient states that it makes her go into an asthma attack.    Social  History   Socioeconomic History   Marital status: Significant Other    Spouse name: Not on file   Number of children: 0   Years of education: Not on file   Highest education level: Associate degree: academic program  Occupational History   Occupation: [employer]  Tobacco Use   Smoking status: Never    Passive exposure: Yes   Smokeless tobacco: Never   Tobacco comments:    Secondhand from Father  Vaping Use   Vaping status: Never Used  Substance and Sexual Activity   Alcohol use: No    Alcohol/week: 0.0 standard drinks of alcohol   Drug use: No    Frequency: 1.0 times per week   Sexual activity: Not on file  Other Topics Concern   Not on file  Social History Narrative   Single, lives with boyfriend   CNA, Editor, Commissioning   No recent travel      Adult Nurse Pulmonary:   Originally from [place]. Always lived in [place]. Previously hasn't traveled outside of the state. 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 No pets currently. She does have carpet & draperies in her home as well as the bedroom. No indoor plants. No bird exposure. No mold exposure. Enjoys drawing & reading.       Diet: Left Blank      Caffeine: Yes      Married, Single if yes what year:       Do you live in a house, apartment, assisted living, condo, trailer, ect: apartment      Is it one or more stories: Left Blank      How many persons live in your home? 2      Pets: no      Highest level or education completed: [employer]      Current/Past profession: Computer      Exercise:  Yes                Type and how often: 2 time a day         Living Will: Yes   DNR: Yes   POA/HPOA: Yes      Functional Status:   Do you have difficulty bathing or dressing yourself? No   Do you have difficulty preparing food or eating? No   Do you have difficulty managing your medications? No   Do you have difficulty managing your finances? No   Do you have difficulty affording  your medications? No   Social Drivers of Health   Tobacco Use: Medium Risk (10/27/2024)   Patient History    Smoking Tobacco Use: Never    Smokeless Tobacco Use: Never    Passive Exposure: Yes  Financial Resource Strain: Low Risk (06/10/2024)   Overall Financial Resource Strain (CARDIA)    Difficulty of Paying Living Expenses: Not hard at all  Food Insecurity: No Food  Insecurity (07/02/2024)   Epic    Worried About Programme Researcher, Broadcasting/film/video in the Last Year: Never true    Ran Out of Food in the Last Year: Never true  Transportation Needs: No Transportation Needs (07/02/2024)   Epic    Lack of Transportation (Medical): No    Lack of Transportation (Non-Medical): No  Physical Activity: Sufficiently Active (06/10/2024)   Exercise Vital Sign    Days of Exercise per Week: 3 days    Minutes of Exercise per Session: 70 min  Stress: Stress Concern Present (06/10/2024)   [employer] of Occupational Health - Occupational Stress Questionnaire    Feeling of Stress: To some extent  Social Connections: Moderately Isolated (06/10/2024)   Social Connection and Isolation Panel    Frequency of Communication with Friends and Family: Once a week    Frequency of Social Gatherings with Friends and Family: Once a week    Attends Religious Services: Never    Database Administrator or Organizations: Yes    Attends Banker Meetings: 1 to 4 times per year    Marital Status: Living with partner  Intimate Partner Violence: Not At Risk (07/02/2024)   Epic    Fear of Current or Ex-Partner: No    Emotionally Abused: No    Physically Abused: No    Sexually Abused: No  Depression (PHQ2-9): Low Risk (04/12/2023)   Depression (PHQ2-9)    PHQ-2 Score: 0  Alcohol Screen: Low Risk (06/10/2024)   Alcohol Screen    Last Alcohol Screening Score (AUDIT): 0  Housing: Low Risk (07/02/2024)   Epic    Unable to Pay for Housing in the Last Year: No    Number of Times Moved in the Last Year: 0    Homeless in the  Last Year: No  Utilities: Not At Risk (07/02/2024)   Epic    Threatened with loss of utilities: No  Health Literacy: Not on file     Family History  Problem Relation Age of Onset   Allergies Mother    Heart disease Mother    Allergies Father    Asthma Father    Diabetes Sister    Asthma Sister    Allergies Sister    Cancer Sister    Asthma Brother    Allergies Brother    Heart disease Maternal Aunt    Breast cancer Maternal Aunt     BP (!) 110/58   Pulse 93   Wt 104.3 kg (230 lb)   SpO2 93%   BMI 42.07 kg/m   Wt Readings from Last 3 Encounters:  10/27/24 104.3 kg (230 lb)  09/03/24 104.8 kg (231 lb)  09/03/24 104.8 kg (231 lb)   Physical Exam  General: Ambulated into clinic. No distress. Neck: no JVD.  Cor: Regular rate & rhythm. No murmurs. Lungs: scattered expiratory wheezes Abdomen: obese, soft, nontender, nondistended. Extremities: no edema Neuro: alert & orientedx3. Affect pleasant  ECG none today  ASSESSMENT & PLAN: 1. Chronic Systolic Heart Failure - NICM, diagnosed 01/2023. - Etiology uncertain. - Echo EF 20-25% (looks closer to 30-35%), RV hyperdynamic  - R/LHC 5/24: no CAD, RA 13, PA 59/42 (48), PCW 33, LV EDP 35. Fick CO 6.13, CI 2.87. Thermo CO/CI: 6.3/2.9. PAPi 1.3 - cMRI 5/24: LVEF 25%, RVEF 42%. Non ischemic cardiomyopathy.  - Echo 08/22/23 EF 30-35% RV ok (limited images)  - Echo 9/25: EF < 20%, G2DD, severe RV dysfunction, RVSP 44 - RHC 9/25: normal filling pressures and  preserved CI at 2.86 - NYHA II-III. Feels worse today, suspect d/t recent upper respiratory illness. Euvolemic on exam.  - Continue torsemide  40 mg daily. Check CMET, digoxin  level and pro-BNP. - Continue spiro 25 mg daily  - Continue Entresto  24-26 mg bid  - Continue digoxin  0.125 mg daily.  - Continue Farxiga  10 mg daily   - Start bisoprolol  2.5 mg daily once recovered from illness - Likely will need advanced therapies soon but limited by weight, noncompliance, RV  dysfunction and limited insight into her treatment plan  - Repeat echo at time of follow-up in 8 weeks   2. Morbid obesity - Body mass index is 42.07 kg/m. - Has seen PharmD for GLP1RA. Unfortunately, insurance will not cover for weight loss or OSA - discussed lifestyle modifications thought diet and exercise/ portion control    3. OSA - sleep study 10/25 c/w mild OSA and nocturnal hypoxemia - followed by Dr. Shlomo, pending CPAP titration (needs to call to schedule  Provided work excuse for today.  F/u 8 weeks with APP (echo same day)  Carolyn Bueche N, PA-C  9:46 AM  "

## 2024-10-27 NOTE — Patient Instructions (Signed)
 Medication Changes:  START BISOPROLOL  2.5MG  ONCE DAILY----WAIT TO START THIS UNTIL YOU ARE FEELING BETTER   Lab Work:  Labs done today, your results will be available in MyChart, we will contact you for abnormal readings.  Special Instructions // Education:  PLEASE CALL DR. DORINE OFFICE ABOUT YOUR CPAP  Follow-Up in: 8 WEEKS AS SCHEDULED WITH AN ECHO   At the [facility], you and your health needs are our priority. We have a designated team specialized in the treatment of Heart Failure. This Care Team includes your primary Heart Failure Specialized Cardiologist (physician), Advanced Practice Providers (APPs- Physician Assistants and Nurse Practitioners), and Pharmacist who all work together to provide you with the care you need, when you need it.   You may see any of the following providers on your designated Care Team at your next follow up:  Dr. Toribio Fuel Dr. Ezra Shuck Dr. Odis Brownie Greig Mosses, NP Caffie Shed, [place] [facility] [place], [place] Beckey Coe, NP Jordan Lee, NP Tinnie Redman, PharmD   Please be sure to bring in all your medications bottles to every appointment.   Need to Contact Us :  If you have any questions or concerns before your next appointment please send us  a message through [place] or call our office at [phone].    TO LEAVE A MESSAGE FOR THE NURSE SELECT OPTION 2, PLEASE LEAVE A MESSAGE INCLUDING: YOUR NAME DATE OF BIRTH CALL BACK NUMBER REASON FOR CALL**this is important as we prioritize the call backs  YOU WILL RECEIVE A CALL BACK THE SAME DAY AS LONG AS YOU CALL BEFORE 4:00 PM

## 2024-10-28 ENCOUNTER — Other Ambulatory Visit (HOSPITAL_COMMUNITY): Payer: Self-pay

## 2024-10-28 LAB — COMPREHENSIVE METABOLIC PANEL WITH GFR
ALT: 20 IU/L (ref 0–32)
AST: 26 IU/L (ref 0–40)
Albumin: 4.1 g/dL (ref 3.9–4.9)
Alkaline Phosphatase: 71 IU/L (ref 41–116)
BUN/Creatinine Ratio: 14 (ref 9–23)
BUN: 14 mg/dL (ref 6–24)
Bilirubin Total: 0.8 mg/dL (ref 0.0–1.2)
CO2: 19 mmol/L — ABNORMAL LOW (ref 20–29)
Calcium: 9.7 mg/dL (ref 8.7–10.2)
Chloride: 103 mmol/L (ref 96–106)
Creatinine, Ser: 0.97 mg/dL (ref 0.57–1.00)
Globulin, Total: 2.7 g/dL (ref 1.5–4.5)
Glucose: 77 mg/dL (ref 70–99)
Potassium: 3.7 mmol/L (ref 3.5–5.2)
Sodium: 142 mmol/L (ref 134–144)
Total Protein: 6.8 g/dL (ref 6.0–8.5)
eGFR: 73 mL/min/1.73

## 2024-10-28 LAB — PRO B NATRIURETIC PEPTIDE: NT-Pro BNP: 2436 pg/mL — ABNORMAL HIGH (ref 0–249)

## 2024-10-28 LAB — DIGOXIN LEVEL: Digoxin, Serum: <0.4 ng/mL — ABNORMAL LOW (ref 0.5–0.9)

## 2024-11-02 ENCOUNTER — Ambulatory Visit (HOSPITAL_COMMUNITY): Payer: Self-pay | Admitting: Physician Assistant

## 2024-11-02 ENCOUNTER — Ambulatory Visit (HOSPITAL_COMMUNITY)

## 2024-11-03 ENCOUNTER — Encounter: Admitting: *Deleted

## 2024-11-03 VITALS — BP 103/67 | HR 82 | Temp 98.9°F | Resp 20 | Wt 230.0 lb

## 2024-11-03 DIAGNOSIS — Z006 Encounter for examination for normal comparison and control in clinical research program: Secondary | ICD-10-CM

## 2024-11-03 NOTE — Addendum Note (Signed)
 Addended by: NORITA IHA D on: 11/03/2024 11:17 AM   Modules accepted: Orders

## 2024-11-03 NOTE — Research (Addendum)
 Patient's BNP does not qualify her for the study. Since she was last screened BNP is to low. This patient is a screen failure.   Carolyn Oconnor,Research Coordinator 11/09/2024      COMET  Informed Consent   Subject Name: Carolyn Oconnor  Subject met inclusion and exclusion criteria.  The informed consent form, study requirements and expectations were reviewed with the subject and questions and concerns were addressed prior to the signing of the consent form.  The subject verbalized understanding of the trial requirements.  The subject agreed to participate in the COMET  trial and signed the informed consent at 09:45  on 11-03-2024 .  The informed consent was obtained prior to performance of any protocol-specific procedures for the subject.  A copy of the signed informed consent was given to the subject and a copy was placed in the subject's medical record.   Carolyn Oconnor    Eligibility Criteria:  More detailed eligibility criteria are presented in Section 5. Inclusion Criteria Adult patients who meet all the following criteria at screening may be included in the study:  Are between >= 18 years and <= 85 years at the signing of informed consent  Have a history of chronic HFrEF, defined as requiring treatment for HF for a minimum of  3 months prior to screening  Are receiving oral loop diuretics  Patients without AFF on screening ECG: ? LVEF < 30% within 6 months of screening ? Elevated N-terminal prohormone of B-type natriuretic peptide (NT-proBNP) >= 1000  pg/mL (BNP >= 300 pg/mL   Patients with AFF on screening ECG: ? LVEF < 25% within 6 months of screening ? Elevated N-terminal prohormone of B-type natriuretic peptide (NT-proBNP) >= 3000  pg/mL (BNP >= 900 pg/mL) ? Not currently taking digoxin   Are currently hospitalized with the primary reason of HF, or had an HF event within 6  months prior to screening  Are established on regional standard-of-care HF therapies for at  least 30 days prior to screening  Systolic blood pressure <= 130 mmHg and diastolic blood pressure <= 90 mmH   Exclusion Criteria  Any of the following criteria will exclude potential patients from the study:  Have AFF on the screening ECG and are currently taking digoxin   Have had acute coronary syndrome, cardiac surgery, valve surgery, any coronary  revascularization, and/or cardiac resynchronization therapy within 3 months of screening  Are admitted to a long-term care facility or hospice  Have a projected survival of < 12 months due to non-cardiovascular causes based on clinical  judgment  Are receiving intravenous inotropes or intravenous vasopressors <= 3 days prior to screening  Are receiving mechanical hemodynamic support or mechanical ventilation <= 7 days prior to  screening  Are receiving intravenous diuretics, intravenous vasodilators, or supplemental oxygen therapy  <= 12 hours prior to screening  Have an estimated glomerular filtration rate (eGFR) < 20 mL/min/1.62m2 or receiving dialysis  at screening  Have previously had a solid organ transplant   Are receiving treatment in another investigational device or drug study or are within 30 days of  ending such investigational treatment at screening  Have previously received omecamtiv mecarbil  Are pregnant or planning pregnancy during the study period, or planning to breastfeed during  treatment with IP or within 5 days after the end of treatment with I  Date of Visit (Screening): Rescreen visit due to patient not coming into the office within 35 days to complete run in visit.  Physical Exam : 11-03-2024  Last Hospitalization admission date for HF: 07-07-2025  Last Discharge date for HF:07-01-2025  Last ER/ED admission date for HF:  NONE Electrocardiogram Complete  YES  Screening Labs(Local) Complete  Chemistry,Hematology, Nt-ProBNP, HS Troponin I Complete  EQ-5D-5L: Complete  KCCQ: Complete

## 2024-11-09 ENCOUNTER — Other Ambulatory Visit (HOSPITAL_COMMUNITY): Payer: Self-pay

## 2024-11-10 NOTE — Progress Notes (Signed)
 The patient is in for a screening visit for COMET.  She has been screened before and is eligible, but did not get back in so has to be rescreened.  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  She says she tested for FLU and it was negative.  She is followed in heart failure.  She has asthma, obesity, and recently diagnosed systolic heart failure, and was seen in the hospital by Dr. Cherrie.  She was last seen last week in HF clinic by [facility].  She was admitted in April and early May with acute systolic CHF.  She diuresed well.  RV was hyperdynamic.  LVEF 25%.  RHC showed normal filling pressures.  She has mild OSA by sleep study and some nocturnal hypoxemia.  She is on a HF regimen including dig, Entresto , Aldactone , demadex , and Farxiga .  Also on inhalers and no beta blockers.  She thinks she is ok, but is still coughing.   Alert, oriented in NAD Frequent cough No JVD Minimal crackles at bases.  Mild exp wheezes.  Cor no murmur Abd soft.  No obvious mass Ext no edema   Chronic systolic CHF c/w NICM  Asthma  Recent URI with cough   test neg for flu  Moderate obesity  Mild OSA with nocturnal hypoxemia  Patient is in for screening for the COMET trial.  Labs are obtained. She has been screened previously.  IP and nature of trial reviewed with patient.  Will await lab studies.  Debby BIRCH. Morris, MD, [facility] Program Chair. [facility]         11/03/2024   10:12 AM  BP 103/67  BP Location Right Arm  Patient Position Sitting  Cuff Size Large  Pulse Rate 82  Temp 98.9 F (37.2 C)  Weight 230 lb (104.3 kg)  Resp 20  SpO2 100 %

## 2024-11-11 LAB — SPECIMEN STATUS REPORT

## 2024-11-11 LAB — PRO B NATRIURETIC PEPTIDE: NT-Pro BNP: 1421 pg/mL — ABNORMAL HIGH (ref 0–249)

## 2024-11-17 ENCOUNTER — Ambulatory Visit (HOSPITAL_COMMUNITY): Payer: Self-pay | Admitting: Cardiology

## 2024-11-17 LAB — COMPREHENSIVE METABOLIC PANEL WITH GFR
ALT: 12 IU/L (ref 0–32)
AST: 18 IU/L (ref 0–40)
Albumin: 3.9 g/dL (ref 3.9–4.9)
Alkaline Phosphatase: 63 IU/L (ref 41–116)
BUN/Creatinine Ratio: 19 (ref 9–23)
BUN: 13 mg/dL (ref 6–24)
Bilirubin Total: 0.6 mg/dL (ref 0.0–1.2)
CO2: 21 mmol/L (ref 20–29)
Calcium: 9.2 mg/dL (ref 8.7–10.2)
Chloride: 105 mmol/L (ref 96–106)
Creatinine, Ser: 0.69 mg/dL (ref 0.57–1.00)
Globulin, Total: 2.6 g/dL (ref 1.5–4.5)
Glucose: 74 mg/dL (ref 70–99)
Potassium: 4 mmol/L (ref 3.5–5.2)
Sodium: 139 mmol/L (ref 134–144)
Total Protein: 6.5 g/dL (ref 6.0–8.5)
eGFR: 108 mL/min/1.73

## 2024-11-17 LAB — CBC
Hematocrit: 41 % (ref 34.0–46.6)
Hemoglobin: 13.9 g/dL (ref 11.1–15.9)
MCH: 30.3 pg (ref 26.6–33.0)
MCHC: 33.9 g/dL (ref 31.5–35.7)
MCV: 89 fL (ref 79–97)
Platelets: 334 x10E3/uL (ref 150–450)
RBC: 4.59 x10E6/uL (ref 3.77–5.28)
RDW: 15.5 % — ABNORMAL HIGH (ref 11.7–15.4)
WBC: 6.6 x10E3/uL (ref 3.4–10.8)

## 2024-11-17 LAB — TROPONIN T: Troponin T (Highly Sensitive): 9 ng/L (ref 0–14)

## 2024-11-17 LAB — BRAIN NATRIURETIC PEPTIDE

## 2024-12-22 ENCOUNTER — Ambulatory Visit (HOSPITAL_COMMUNITY): Payer: PRIVATE HEALTH INSURANCE

## 2024-12-22 ENCOUNTER — Ambulatory Visit (HOSPITAL_COMMUNITY)
